# Patient Record
Sex: Male | Born: 1965 | Race: White | Hispanic: No | Marital: Married | Smoking: Never smoker
Health system: Southern US, Community
[De-identification: ages and names within clinical notes are randomized; demographics above are authoritative.]

## PROBLEM LIST (undated history)

## (undated) DIAGNOSIS — N529 Male erectile dysfunction, unspecified: Secondary | ICD-10-CM

## (undated) DIAGNOSIS — C9110 Chronic lymphocytic leukemia of B-cell type not having achieved remission: Secondary | ICD-10-CM

## (undated) DIAGNOSIS — M1990 Unspecified osteoarthritis, unspecified site: Secondary | ICD-10-CM

## (undated) DIAGNOSIS — S42009A Fracture of unspecified part of unspecified clavicle, initial encounter for closed fracture: Secondary | ICD-10-CM

## (undated) DIAGNOSIS — T8859XA Other complications of anesthesia, initial encounter: Secondary | ICD-10-CM

## (undated) DIAGNOSIS — K219 Gastro-esophageal reflux disease without esophagitis: Secondary | ICD-10-CM

## (undated) DIAGNOSIS — Z87442 Personal history of urinary calculi: Secondary | ICD-10-CM

## (undated) DIAGNOSIS — N289 Disorder of kidney and ureter, unspecified: Secondary | ICD-10-CM

## (undated) DIAGNOSIS — N209 Urinary calculus, unspecified: Secondary | ICD-10-CM

## (undated) DIAGNOSIS — T4145XA Adverse effect of unspecified anesthetic, initial encounter: Secondary | ICD-10-CM

## (undated) HISTORY — PX: BICEPS TENDON REPAIR: SHX566

## (undated) HISTORY — DX: Fracture of unspecified part of unspecified clavicle, initial encounter for closed fracture: S42.009A

## (undated) HISTORY — DX: Male erectile dysfunction, unspecified: N52.9

## (undated) HISTORY — DX: Chronic lymphocytic leukemia of B-cell type not having achieved remission: C91.10

## (undated) HISTORY — PX: CHOLECYSTECTOMY: SHX55

## (undated) HISTORY — DX: Urinary calculus, unspecified: N20.9

## (undated) HISTORY — PX: ANTERIOR CRUCIATE LIGAMENT REPAIR: SHX115

## (undated) HISTORY — DX: Morbid (severe) obesity due to excess calories: E66.01

---

## 2000-09-20 ENCOUNTER — Emergency Department (HOSPITAL_COMMUNITY): Admission: EM | Admit: 2000-09-20 | Discharge: 2000-09-20 | Payer: Self-pay | Admitting: *Deleted

## 2006-08-02 ENCOUNTER — Ambulatory Visit: Payer: Self-pay

## 2006-09-05 ENCOUNTER — Ambulatory Visit: Payer: Self-pay | Admitting: Orthopaedic Surgery

## 2006-10-27 ENCOUNTER — Ambulatory Visit: Payer: Self-pay | Admitting: Orthopaedic Surgery

## 2006-11-17 ENCOUNTER — Ambulatory Visit: Payer: Self-pay | Admitting: Orthopaedic Surgery

## 2007-03-23 ENCOUNTER — Other Ambulatory Visit: Payer: Self-pay

## 2007-03-23 ENCOUNTER — Emergency Department: Payer: Self-pay | Admitting: Emergency Medicine

## 2007-03-24 ENCOUNTER — Emergency Department: Payer: Self-pay | Admitting: Emergency Medicine

## 2007-03-24 ENCOUNTER — Observation Stay: Payer: Self-pay | Admitting: Internal Medicine

## 2010-08-23 ENCOUNTER — Emergency Department: Payer: Self-pay | Admitting: Internal Medicine

## 2011-08-05 ENCOUNTER — Emergency Department: Payer: Self-pay

## 2011-08-05 ENCOUNTER — Emergency Department: Payer: Self-pay | Admitting: Emergency Medicine

## 2011-08-06 ENCOUNTER — Emergency Department: Payer: Self-pay | Admitting: Internal Medicine

## 2012-04-30 ENCOUNTER — Emergency Department: Payer: Self-pay | Admitting: Unknown Physician Specialty

## 2012-04-30 LAB — BASIC METABOLIC PANEL
Anion Gap: 10 (ref 7–16)
Creatinine: 1.32 mg/dL — ABNORMAL HIGH (ref 0.60–1.30)
EGFR (African American): >60
EGFR (Non-African Amer.): >60
Glucose: 105 mg/dL — ABNORMAL HIGH (ref 65–99)
Osmolality: 278 (ref 275–301)
Potassium: 3.7 mmol/L (ref 3.5–5.1)
Sodium: 138 mmol/L (ref 136–145)

## 2012-04-30 LAB — CBC WITH DIFFERENTIAL/PLATELET
Eosinophil %: 0.2 %
HCT: 47.7 % (ref 40.0–52.0)
HGB: 15.4 g/dL (ref 13.0–18.0)
Lymphocyte #: 2.2 10*3/uL (ref 1.0–3.6)
Lymphocyte %: 10.7 %
MCH: 26.7 pg (ref 26.0–34.0)
MCHC: 32.2 g/dL (ref 32.0–36.0)
MCV: 83 fL (ref 80–100)
Monocyte #: 0.7 x10 3/mm (ref 0.2–1.0)
Monocyte %: 3.2 %
Platelet: 201 10*3/uL (ref 150–440)

## 2012-05-01 LAB — URINALYSIS, COMPLETE
Bacteria: NONE SEEN
Bilirubin,UR: NEGATIVE
Glucose,UR: NEGATIVE mg/dL (ref 0–75)
Ketone: NEGATIVE
Nitrite: NEGATIVE
Ph: 5 (ref 4.5–8.0)
Protein: NEGATIVE
Specific Gravity: 1.016 (ref 1.003–1.030)
Squamous Epithelial: <1
WBC UR: 1 /HPF (ref 0–5)

## 2012-05-01 LAB — HEPATIC FUNCTION PANEL A (ARMC)
Alkaline Phosphatase: 101 U/L (ref 50–136)
SGPT (ALT): 76 U/L
Total Protein: 7.2 g/dL (ref 6.4–8.2)

## 2012-05-01 LAB — LIPASE, BLOOD: Lipase: 117 U/L (ref 73–393)

## 2012-05-06 LAB — CULTURE, BLOOD (SINGLE)

## 2012-05-08 LAB — CULTURE, BLOOD (SINGLE)

## 2013-06-04 ENCOUNTER — Inpatient Hospital Stay: Payer: Self-pay | Admitting: Surgery

## 2013-06-04 LAB — URINALYSIS, COMPLETE
Glucose,UR: NEGATIVE mg/dL (ref 0–75)
Leukocyte Esterase: NEGATIVE
Ph: 5 (ref 4.5–8.0)
Protein: NEGATIVE
RBC,UR: 2 /HPF (ref 0–5)
Squamous Epithelial: <1

## 2013-06-04 LAB — COMPREHENSIVE METABOLIC PANEL
Albumin: 3.6 g/dL (ref 3.4–5.0)
Alkaline Phosphatase: 125 U/L (ref 50–136)
Bilirubin,Total: 0.3 mg/dL (ref 0.2–1.0)
Calcium, Total: 9 mg/dL (ref 8.5–10.1)
Chloride: 109 mmol/L — ABNORMAL HIGH (ref 98–107)
Co2: 26 mmol/L (ref 21–32)
EGFR (African American): >60
Osmolality: 288 (ref 275–301)
Potassium: 4.1 mmol/L (ref 3.5–5.1)
SGOT(AST): 24 U/L (ref 15–37)
SGPT (ALT): 39 U/L (ref 12–78)
Sodium: 143 mmol/L (ref 136–145)
Total Protein: 7.7 g/dL (ref 6.4–8.2)

## 2013-06-04 LAB — DRUG SCREEN, URINE
Amphetamines, Ur Screen: NEGATIVE (ref ?–1000)
Benzodiazepine, Ur Scrn: NEGATIVE (ref ?–200)
Cocaine Metabolite,Ur ~~LOC~~: NEGATIVE (ref ?–300)
Opiate, Ur Screen: POSITIVE (ref ?–300)
Phencyclidine (PCP) Ur S: NEGATIVE (ref ?–25)
Tricyclic, Ur Screen: NEGATIVE (ref ?–1000)

## 2013-06-04 LAB — CBC
MCH: 26.8 pg (ref 26.0–34.0)
Platelet: 247 10*3/uL (ref 150–440)
RBC: 5.54 10*6/uL (ref 4.40–5.90)
RDW: 15.1 % — ABNORMAL HIGH (ref 11.5–14.5)
WBC: 15.8 10*3/uL — ABNORMAL HIGH (ref 3.8–10.6)

## 2013-06-04 LAB — LIPASE, BLOOD: Lipase: 136 U/L (ref 73–393)

## 2013-06-06 LAB — PATHOLOGY REPORT

## 2013-10-25 ENCOUNTER — Emergency Department (HOSPITAL_COMMUNITY)
Admission: EM | Admit: 2013-10-25 | Discharge: 2013-10-26 | Disposition: A | Discharge status: Home / Self Care | Payer: Self-pay | Attending: Emergency Medicine | Admitting: Emergency Medicine

## 2013-10-25 ENCOUNTER — Encounter (HOSPITAL_COMMUNITY): Payer: Self-pay | Admitting: Emergency Medicine

## 2013-10-25 ENCOUNTER — Emergency Department (HOSPITAL_COMMUNITY): Payer: Self-pay

## 2013-10-25 DIAGNOSIS — Y9389 Activity, other specified: Secondary | ICD-10-CM | POA: Insufficient documentation

## 2013-10-25 DIAGNOSIS — F17200 Nicotine dependence, unspecified, uncomplicated: Secondary | ICD-10-CM | POA: Insufficient documentation

## 2013-10-25 DIAGNOSIS — M546 Pain in thoracic spine: Secondary | ICD-10-CM

## 2013-10-25 DIAGNOSIS — Y92410 Unspecified street and highway as the place of occurrence of the external cause: Secondary | ICD-10-CM | POA: Insufficient documentation

## 2013-10-25 DIAGNOSIS — S42002A Fracture of unspecified part of left clavicle, initial encounter for closed fracture: Secondary | ICD-10-CM

## 2013-10-25 DIAGNOSIS — IMO0002 Reserved for concepts with insufficient information to code with codable children: Secondary | ICD-10-CM | POA: Insufficient documentation

## 2013-10-25 DIAGNOSIS — S42009A Fracture of unspecified part of unspecified clavicle, initial encounter for closed fracture: Secondary | ICD-10-CM | POA: Insufficient documentation

## 2013-10-25 LAB — CBC WITH DIFFERENTIAL/PLATELET
Basophils Relative: 0 % (ref 0–1)
Eosinophils Absolute: 0.1 10*3/uL (ref 0.0–0.7)
Eosinophils Relative: 0 % (ref 0–5)
HCT: 44.8 % (ref 39.0–52.0)
Hemoglobin: 14.8 g/dL (ref 13.0–17.0)
MCH: 28.1 pg (ref 26.0–34.0)
MCHC: 33 g/dL (ref 30.0–36.0)
MCV: 85 fL (ref 78.0–100.0)
Monocytes Absolute: 1 10*3/uL (ref 0.1–1.0)
Monocytes Relative: 6 % (ref 3–12)

## 2013-10-25 LAB — BASIC METABOLIC PANEL
BUN: 23 mg/dL (ref 6–23)
Chloride: 102 mEq/L (ref 96–112)
Creatinine, Ser: 0.93 mg/dL (ref 0.50–1.35)
GFR calc Af Amer: >90 mL/min (ref >90)
GFR calc non Af Amer: >90 mL/min (ref >90)
Sodium: 136 mEq/L (ref 135–145)

## 2013-10-25 LAB — LACTIC ACID, PLASMA: Lactic Acid, Venous: 1.3 mmol/L (ref 0.5–2.2)

## 2013-10-25 MED ORDER — HYDROMORPHONE HCL PF 1 MG/ML IJ SOLN
1.0000 mg | Freq: Once | INTRAMUSCULAR | Status: AC
  Administered 2013-10-25: 1 mg via INTRAVENOUS
  Filled 2013-10-25: qty 1

## 2013-10-25 MED ORDER — FENTANYL CITRATE 0.05 MG/ML IJ SOLN
50.0000 ug | Freq: Once | INTRAMUSCULAR | Status: AC
  Administered 2013-10-25: 50 ug via INTRAVENOUS
  Filled 2013-10-25: qty 2

## 2013-10-25 MED ORDER — DIAZEPAM 5 MG PO TABS
5.0000 mg | ORAL_TABLET | Freq: Once | ORAL | Status: AC
  Administered 2013-10-25: 5 mg via ORAL
  Filled 2013-10-25: qty 1

## 2013-10-25 MED ORDER — OXYCODONE-ACETAMINOPHEN 5-325 MG PO TABS
1.0000 | ORAL_TABLET | Freq: Once | ORAL | Status: AC
  Administered 2013-10-25: 1 via ORAL
  Filled 2013-10-25: qty 1

## 2013-10-25 MED ORDER — SODIUM CHLORIDE 0.9 % IV BOLUS (SEPSIS)
1000.0000 mL | Freq: Once | INTRAVENOUS | Status: AC
  Administered 2013-10-25: 1000 mL via INTRAVENOUS
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
## 2013-10-25 NOTE — ED Notes (Signed)
Med given 

## 2013-10-25 NOTE — ED Notes (Signed)
Pain "better"

## 2013-10-25 NOTE — ED Notes (Signed)
The pt has some pain relief.  Ortho tech called for a shoulder immobilizer

## 2013-10-25 NOTE — ED Notes (Signed)
c-t will call for the pt  In 2 minutes

## 2013-10-25 NOTE — ED Notes (Signed)
Back to c-t 

## 2013-10-25 NOTE — ED Notes (Signed)
The pt received fentanyl im 100 mcg enroute here

## 2013-10-25 NOTE — ED Notes (Signed)
The pt returned from c-t asking for more pain meds
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
## 2013-10-25 NOTE — ED Notes (Signed)
No iv yet.  Tech here to take to xray

## 2013-10-25 NOTE — ED Notes (Signed)
pts family at the bedside.

## 2013-10-25 NOTE — Progress Notes (Signed)
Orthopedic Tech Progress Note Patient Details:  Albert Sanders Mar 21, 1966 [id number]  Ortho Devices Type of Ortho Device: Sling immobilizer Ortho Device/Splint Location: lue Ortho Device/Splint Interventions: Application   [name] 10/25/2013, 11:01 PM
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
## 2013-10-25 NOTE — ED Notes (Signed)
The pt keeps c/o extreme pain ice  Packs placed and he is asking more iv pain med again.  None ordered

## 2013-10-26 MED ORDER — ONDANSETRON HCL 4 MG PO TABS: 4.0000 mg | ORAL_TABLET | Freq: Four times a day (QID) | ORAL | Status: DC

## 2013-10-26 MED ORDER — ORPHENADRINE CITRATE ER 100 MG PO TB12: 100.0000 mg | ORAL_TABLET | Freq: Two times a day (BID) | ORAL | Status: DC | PRN

## 2013-10-26 MED ORDER — OXYCODONE HCL 5 MG PO TABS: 5.0000 mg | ORAL_TABLET | ORAL | Status: DC | PRN

## 2013-10-26 NOTE — ED Notes (Signed)
Crackers and sprite given
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
## 2015-01-10 ENCOUNTER — Emergency Department: Payer: Self-pay | Admitting: Emergency Medicine

## 2015-03-03 ENCOUNTER — Emergency Department (HOSPITAL_COMMUNITY): Payer: Self-pay

## 2015-03-03 ENCOUNTER — Emergency Department (HOSPITAL_COMMUNITY)
Admission: EM | Admit: 2015-03-03 | Discharge: 2015-03-03 | Disposition: A | Discharge status: Home / Self Care | Payer: Self-pay | Attending: Emergency Medicine | Admitting: Emergency Medicine

## 2015-03-03 ENCOUNTER — Encounter (HOSPITAL_COMMUNITY): Payer: Self-pay

## 2015-03-03 DIAGNOSIS — Y998 Other external cause status: Secondary | ICD-10-CM | POA: Insufficient documentation

## 2015-03-03 DIAGNOSIS — Y9289 Other specified places as the place of occurrence of the external cause: Secondary | ICD-10-CM | POA: Insufficient documentation

## 2015-03-03 DIAGNOSIS — Z79899 Other long term (current) drug therapy: Secondary | ICD-10-CM | POA: Insufficient documentation

## 2015-03-03 DIAGNOSIS — W19XXXA Unspecified fall, initial encounter: Secondary | ICD-10-CM

## 2015-03-03 DIAGNOSIS — Y9389 Activity, other specified: Secondary | ICD-10-CM | POA: Insufficient documentation

## 2015-03-03 DIAGNOSIS — S3992XA Unspecified injury of lower back, initial encounter: Secondary | ICD-10-CM | POA: Insufficient documentation

## 2015-03-03 DIAGNOSIS — W1789XA Other fall from one level to another, initial encounter: Secondary | ICD-10-CM | POA: Insufficient documentation

## 2015-03-03 DIAGNOSIS — S8991XA Unspecified injury of right lower leg, initial encounter: Secondary | ICD-10-CM | POA: Insufficient documentation

## 2015-03-03 LAB — I-STAT CHEM 8, ED
BUN: 20 mg/dL (ref 6–23)
CALCIUM ION: 1.04 mmol/L — AB (ref 1.12–1.23)
Chloride: 103 mmol/L (ref 96–112)
Creatinine, Ser: 0.9 mg/dL (ref 0.50–1.35)
Glucose, Bld: 94 mg/dL (ref 70–99)
HCT: 50 % (ref 39.0–52.0)
Hemoglobin: 17 g/dL (ref 13.0–17.0)
Potassium: 4.2 mmol/L (ref 3.5–5.1)
Sodium: 139 mmol/L (ref 135–145)
TCO2: 21 mmol/L (ref 0–100)

## 2015-03-03 LAB — CBC WITH DIFFERENTIAL/PLATELET
BASOS ABS: 0 10*3/uL (ref 0.0–0.1)
Basophils Relative: 0 % (ref 0–1)
EOS ABS: 0.2 10*3/uL (ref 0.0–0.7)
Eosinophils Relative: 1 % (ref 0–5)
HCT: 46.4 % (ref 39.0–52.0)
Hemoglobin: 15.2 g/dL (ref 13.0–17.0)
LYMPHS PCT: 47 % — AB (ref 12–46)
Lymphs Abs: 8.4 10*3/uL — ABNORMAL HIGH (ref 0.7–4.0)
MCH: 27.6 pg (ref 26.0–34.0)
MCHC: 32.8 g/dL (ref 30.0–36.0)
MCV: 84.4 fL (ref 78.0–100.0)
MONOS PCT: 6 % (ref 3–12)
Monocytes Absolute: 1.1 10*3/uL — ABNORMAL HIGH (ref 0.1–1.0)
Neutro Abs: 8.3 10*3/uL — ABNORMAL HIGH (ref 1.7–7.7)
Neutrophils Relative %: 46 % (ref 43–77)
PLATELETS: 213 10*3/uL (ref 150–400)
RBC: 5.5 MIL/uL (ref 4.22–5.81)
RDW: 14.6 % (ref 11.5–15.5)
WBC: 18 10*3/uL — ABNORMAL HIGH (ref 4.0–10.5)

## 2015-03-03 LAB — ABO/RH: ABO/RH(D): B POS

## 2015-03-03 LAB — TYPE AND SCREEN
ABO/RH(D): B POS
Antibody Screen: NEGATIVE

## 2015-03-03 MED ORDER — MORPHINE SULFATE 4 MG/ML IJ SOLN
4.0000 mg | Freq: Once | INTRAMUSCULAR | Status: AC
  Administered 2015-03-03: 4 mg via INTRAVENOUS
  Filled 2015-03-03: qty 1

## 2015-03-03 MED ORDER — OXYCODONE-ACETAMINOPHEN 5-325 MG PO TABS: 1.0000 | ORAL_TABLET | ORAL | Status: DC | PRN

## 2015-03-03 MED ORDER — FENTANYL CITRATE 0.05 MG/ML IJ SOLN: 150.0000 ug | Freq: Once | INTRAMUSCULAR | Status: DC

## 2015-03-03 MED ORDER — MORPHINE SULFATE 4 MG/ML IJ SOLN
4.0000 mg | Freq: Once | INTRAMUSCULAR | Status: DC
  Filled 2015-03-03: qty 1

## 2015-03-03 MED ORDER — CYCLOBENZAPRINE HCL 10 MG PO TABS: 10.0000 mg | ORAL_TABLET | Freq: Two times a day (BID) | ORAL | Status: DC | PRN

## 2015-03-03 MED ORDER — OXYCODONE-ACETAMINOPHEN 5-325 MG PO TABS
2.0000 | ORAL_TABLET | Freq: Once | ORAL | Status: AC
  Administered 2015-03-03: 2 via ORAL
  Filled 2015-03-03: qty 2

## 2015-03-03 MED ORDER — ONDANSETRON HCL 4 MG/2ML IJ SOLN
4.0000 mg | Freq: Once | INTRAMUSCULAR | Status: AC
  Administered 2015-03-03: 4 mg via INTRAVENOUS
  Filled 2015-03-03: qty 2

## 2015-03-03 MED ORDER — MORPHINE SULFATE 4 MG/ML IJ SOLN
4.0000 mg | Freq: Once | INTRAMUSCULAR | Status: AC
  Administered 2015-03-03: 4 mg via INTRAVENOUS

## 2015-03-03 NOTE — ED Notes (Signed)
Pt. Left with all belongings 

## 2015-03-03 NOTE — ED Notes (Signed)
Pt fell approx 6-8 ft. Off a ladder while cleaning the gutters. Pt. States he attempted to land on his feet and felt a "pop" in his R leg when he landed. Pt. Complaint of lower back pain and R knee pain behind the knee cap. Pt. Given 100 mcg fentanyl. Pt. On LSB with head blocks and C collar. A x O x4.

## 2015-03-03 NOTE — ED Notes (Signed)
Pt. Walked about 69ft. C/o pain in back but was able to stand up straight. C/o pain in knee but was able to move and bear weight on it

## 2015-03-03 NOTE — Discharge Instructions (Signed)
Back Pain, Adult Low back pain is very common. About 1 in 5 people have back pain.The cause of low back pain is rarely dangerous. The pain often gets better over time.About half of people with a sudden onset of back pain feel better in just 2 weeks. About 8 in 10 people feel better by 6 weeks.  CAUSES Some common causes of back pain include:  Strain of the muscles or ligaments supporting the spine.  Wear and tear (degeneration) of the spinal discs.  Arthritis.  Direct injury to the back. DIAGNOSIS Most of the time, the direct cause of low back pain is not known.However, back pain can be treated effectively even when the exact cause of the pain is unknown.Answering your caregiver's questions about your overall health and symptoms is one of the most accurate ways to make sure the cause of your pain is not dangerous. If your caregiver needs more information, he or she may order lab work or imaging tests (X-rays or MRIs).However, even if imaging tests show changes in your back, this usually does not require surgery. HOME CARE INSTRUCTIONS For many people, back pain returns.Since low back pain is rarely dangerous, it is often a condition that people can learn to manageon their own.   Remain active. It is stressful on the back to sit or stand in one place. Do not sit, drive, or stand in one place for more than 30 minutes at a time. Take short walks on level surfaces as soon as pain allows.Try to increase the length of time you walk each day.  Do not stay in bed.Resting more than 1 or 2 days can delay your recovery.  Do not avoid exercise or work.Your body is made to move.It is not dangerous to be active, even though your back may hurt.Your back will likely heal faster if you return to being active before your pain is gone.  Pay attention to your body when you bend and lift. Many people have less discomfortwhen lifting if they bend their knees, keep the load close to their bodies,and  avoid twisting. Often, the most comfortable positions are those that put less stress on your recovering back.  Find a comfortable position to sleep. Use a firm mattress and lie on your side with your knees slightly bent. If you lie on your back, put a pillow under your knees.  Only take over-the-counter or prescription medicines as directed by your caregiver. Over-the-counter medicines to reduce pain and inflammation are often the most helpful.Your caregiver may prescribe muscle relaxant drugs.These medicines help dull your pain so you can more quickly return to your normal activities and healthy exercise.  Put ice on the injured area.  Put ice in a plastic bag.  Place a towel between your skin and the bag.  Leave the ice on for 15-20 minutes, 03-04 times a day for the first 2 to 3 days. After that, ice and heat may be alternated to reduce pain and spasms.  Ask your caregiver about trying back exercises and gentle massage. This may be of some benefit.  Avoid feeling anxious or stressed.Stress increases muscle tension and can worsen back pain.It is important to recognize when you are anxious or stressed and learn ways to manage it.Exercise is a great option. SEEK MEDICAL CARE IF:  You have pain that is not relieved with rest or medicine.  You have pain that does not improve in 1 week.  You have new symptoms.  You are generally not feeling well. SEEK   IMMEDIATE MEDICAL CARE IF:   You have pain that radiates from your back into your legs.  You develop new bowel or bladder control problems.  You have unusual weakness or numbness in your arms or legs.  You develop nausea or vomiting.  You develop abdominal pain.  You feel faint. Document Released: 11/14/2005 Document Revised: 05/15/2012 Document Reviewed: 03/18/2014 [employer] Patient Information 2015 [employer]. This information is not intended to replace advice given to you by your health care provider. Make sure you  discuss any questions you have with your health care provider.  

## 2015-03-03 NOTE — ED Provider Notes (Signed)
CSN: [id number]     Arrival date & time 03/03/15  1655 History   First MD Initiated Contact with Patient 03/03/15 1702     Chief Complaint  Patient presents with  . Fall     (Consider location/radiation/quality/duration/timing/severity/associated sxs/prior Treatment) HPI   Yancy Knoble is a 49 year old male with no significant past medical history comes in after a fall. Patient was on a ladder and was 6-8 feet in the air when he fell off and landed on his feet.  He had immediate pain in his right knee as well as his lower back. EMS was called they immobilized him on a backboard and c-collar and brought him to the hospital for further evaluation.   He denies any weakness in his extremities.  The pain in his back he describes as severe, in the lower back.  The pain in his right knee he describes as feeling like it is behind his right knee and says it feels similar to when he previously pulled his ACL.  He denies any chest/abdominal pain.  He denies any headache.  He had no LOC in the fall.  He denies neck pain.  History reviewed. No pertinent past medical history. Past Surgical History  Procedure Laterality Date  . Cholecystectomy    . Anterior cruciate ligament repair     No family history on file. History  Substance Use Topics  . Smoking status: Never Smoker   . Smokeless tobacco: Current User    Types: Snuff  . Alcohol Use: No    Review of Systems  Constitutional: Negative for fever and chills.  Eyes: Negative for redness.  Respiratory: Negative for cough and shortness of breath.   Cardiovascular: Negative for chest pain.  Gastrointestinal: Negative for nausea, vomiting, abdominal pain and diarrhea.  Genitourinary: Negative for dysuria.  Musculoskeletal: Positive for back pain (lower).       Right knee pain  Skin: Negative for rash.  Neurological: Negative for headaches.  All other systems reviewed and are negative.     Allergies  Review of patient's allergies  indicates no known allergies.  Home Medications   Prior to Admission medications   Medication Sig Start Date End Date Taking? Authorizing Provider  Multiple Vitamin (MULTIVITAMIN WITH MINERALS) TABS tablet Take 1 tablet by mouth daily.    Historical Provider, MD  ondansetron (ZOFRAN) 4 MG tablet Take 1 tablet (4 mg total) by mouth every 6 (six) hours. 10/26/13   Robynn Pane, MD  orphenadrine (NORFLEX) 100 MG tablet Take 1 tablet (100 mg total) by mouth 2 (two) times daily as needed for muscle spasms. 10/26/13   Robynn Pane, MD  oxyCODONE (ROXICODONE) 5 MG immediate release tablet Take 1-2 tablets (5-10 mg total) by mouth every 4 (four) hours as needed for severe pain. 10/26/13   Robynn Pane, MD   SpO2 98% Physical Exam  Constitutional: He is oriented to person, place, and time. No distress.  HENT:  Head: Normocephalic and atraumatic.  Eyes: EOM are normal. Pupils are equal, round, and reactive to light.  Neck: Normal range of motion. Neck supple.  Cardiovascular: Normal rate.   Pulmonary/Chest: Effort normal. No respiratory distress. He exhibits no tenderness.  Abdominal: Soft. There is no tenderness. There is no rebound and no guarding.  Musculoskeletal:  There is ttp around the right knee w/ no obvious deformity.  The right ankle appears swollen but the patient denies any ttp.  He has intact motor/sensory function and good cap refill in both feet.  He  has ttp over his lumbar spine w/o any stepoffs.  Neurological: He is alert and oriented to person, place, and time.  Skin: No rash noted. He is not diaphoretic.  Psychiatric: He has a normal mood and affect.    ED Course  Procedures (including critical care time) Labs Review Labs Reviewed  CBC WITH DIFFERENTIAL/PLATELET  URINALYSIS, ROUTINE W REFLEX MICROSCOPIC  I-STAT CHEM 8, ED  TYPE AND SCREEN    Imaging Review No results found.   EKG Interpretation None      MDM   Final diagnoses:  None    Kelton Bultman is a  49 year old male with no significant past medical history comes in after a fall. Patient was on a ladder approx 6-8 feet in the air when he fell off and landed on his feet and had immediate pain in his right knee as well as his lower back.   Exam as above.  No neuro/sensory deficits in the bilateral lower extremities. He has ttp over his lumbar spine and ttp of the right knee.  There is some swelling of the right ankle but no ttp.  Will obtain plain films of the right knee.  Will obtain CT head/c/t/l spine, abdominal ct, cxr/pxr, and plain films of the right knee and ankle.  All films show no acute injuries.  paitent able to ambulate in the ED.  VS continue to be stable.  Abdominal CT showed abdominal lymphadenopathy which the radiologst recommended f/u CT in 3 months for.  Patient does have a white count of 18 but in the setting of trauma this is likely stress response. He will need repeat labs and imaging in 3 months which he was informed of in the ED.  I have discussed the results, Dx and Tx plan with the patient. They expressed understanding and agree with the plan and were told to return to ED with any worsening of condition or concern.    Disposition: Discharge  Condition: Good  Discharge Medication List as of 03/03/2015  8:07 PM    START taking these medications   Details  cyclobenzaprine (FLEXERIL) 10 MG tablet Take 1 tablet (10 mg total) by mouth 2 (two) times daily as needed for muscle spasms., Starting 03/03/2015, Until Discontinued, Print    oxyCODONE-acetaminophen (PERCOCET/ROXICET) 5-325 MG per tablet Take 1 tablet by mouth every 4 (four) hours as needed for severe pain., Starting 03/03/2015, Until Discontinued, Print        Follow Up: No Pcp Per Patient     [facility] [address] [id number] [place] [phone] In 3 days    Pt seen in conjunction with Dr. Fabio Asa,  MD 03/04/15 [place], MD 03/04/15 [place], MD 03/04/15 [phone]

## 2015-03-20 NOTE — Op Note (Signed)
PATIENT NAME:  Albert Sanders, Albert Sanders MR#:  [id number] DATE OF BIRTH:  [birth year]  DATE OF PROCEDURE:  06/04/2013  PREOPERATIVE DIAGNOSIS:  Acute calculus cholecystitis.   POSTOPERATIVE DIAGNOSIS:  Acute calculus cholecystitis with early gangrenous changes.   PROCEDURE PERFORMED:  Laparoscopic cholecystectomy.   SURGEON:  Sherri Rad, M.D. FACS  ASSISTANT:  Scrub tech.  TYPE OF ANESTHESIA:  General endotracheal.   SPECIMENS:  Gallbladder with contents.   FINDINGS:  As described above.   ESTIMATED BLOOD LOSS:  25 mL.   DRAINS:  Jackson-Pratt in Morison pouch.   DESCRIPTION OF PROCEDURE:  Informed consent, supine position, general oral endotracheal anesthesia, sterile prep and drape of the abdomen with ChloraPrep.  Timeout was observed.  A 12 mm blunt Hassan trocar was placed via an open technique through an infraumbilical transverse-oriented skin incision.  Stay sutures being passed through the fascia.  Pneumoperitoneum was established.  The patient was then positioned in reverse Trendelenburg and airplane right side up.  A 5 mm Bladeless trocar was placed in the epigastric region.  Two 5-mm first assistant ports in the right subcostal margin.  The gallbladder was identified in the right upper quadrant, had early patchy gangrenous changes.  It was aspirated of 50 mL of thick bile.  Grasped along the fundus and elevated towards the right shoulder.  Lateral traction was placed on Hartmann's pouch.  Dissection of the hepatoduodenal ligament with blunt technique and point cautery demonstrated a cystic duct and single cystic artery with critical view of safety cholecystectomy being performed.  Three clips were placed on the cystic duct on the portal side, single clip on the gallbladder side.  The structure was then divided.  Small intervening lymphatic branch was divided between hemoclips.  The cystic artery was doubly clipped on the portal side, singly clipped on the gallbladder side and divided.  The  gallbladder was then retrieved off the gallbladder fossa utilizing hook electrocautery and blunt technique with spillage of a large amount of bile during the process.  This was immediately aspirated.  The specimen was placed into an Endo Catch device and retrieved.  The right upper quadrant gallbladder fossa was irrigated with 2 liters of warm normal saline during the case and aspirated dry.  A 19 mm Blake drain was directed into the Morison's pouch and exited the lowermost right upper quadrant port site.  Drain site was secured with nylon suture.  With ports removed under direct visualization and hemostasis ensured on the operative field, the infraumbilical fascial defect was reapproximated with several interrupted figure-of-eight and simple vertically oriented #0 Vicryl sutures.  Skin edges were reapproximated utilizing a skin stapler followed by application of Telfa and Tegaderm.  A total of 30 mL of 0.25% plain Marcaine was infiltrated along all skin and fascial incisions prior to closure.  The patient was then subsequently extubated and taken to the recovery room in stable and satisfactory condition by anesthesia services.     ____________________________ Jeannette How Marina Gravel, MD FACS mab:ea D: 06/04/2013 23:40:41 ET T: 06/05/2013 04:18:25 ET JOB#: [id number]  cc: Elta Guadeloupe A. Marina Gravel, MD, <Dictator> Mliss Wedin A Lesta Limbert MD ELECTRONICALLY SIGNED 06/06/2013 0:12

## 2015-03-20 NOTE — H&P (Signed)
Subjective/Chief Complaint severe RUQ pain   History of Present Illness 49 y/o obese white male presents with acute onset of RUQ pain and nausea.  Had similar but less intense type episode 1 week ago which resolved with some antacids.  W/U in ER suggestive of acute calculus cholecystitis seen on ultrasound.  Denies post-prandial abdominal pain.  Writhing in pain upon arrival, took 20 minutes for initial nursing evaluation.   Past History knee surgery   Past Med/Surgical Hx:  CHRONIC BACK PAIN:   right shoulder:   acl left knee:   ALLERGIES:  IVP Dye: Swelling   Other Allergies none.   HOME MEDICATIONS: Medication Instructions Status  PriLOSEC OTC 20 mg oral delayed release tablet 1 tab(s) orally once a day Active   Family and Social History:  Family History Non-Contributory   Social History negative tobacco, negative Illicit drugs   Place of Living Home   Review of Systems:  Subjective/Chief Complaint see above   Fever/Chills No   Cough No   Sputum No   Abdominal Pain Yes   Nausea/Vomiting Yes   Medications/Allergies Reviewed Medications/Allergies reviewed   Physical Exam:  GEN obese, disheveled, critically ill appearing   HEENT pale conjunctivae, PERRL, hearing intact to voice   NECK supple   RESP normal resp effort  clear BS   ABD positive tenderness  soft  positive Murphy's sign   LYMPH negative neck, negative axillae   EXTR negative cyanosis/clubbing, negative edema   SKIN normal to palpation, No rashes   NEURO cranial nerves intact   PSYCH A+O to time, place, person   Lab Results: Hepatic:  08-Jul-14 04:31   Bilirubin, Total 0.3  Alkaline Phosphatase 125  SGPT (ALT) 39  SGOT (AST) 24  Total Protein, Serum 7.7  Albumin, Serum 3.6  Routine Chem:  08-Jul-14 04:31   Glucose, Serum  113  BUN  19  Creatinine (comp) 1.15  Sodium, Serum 143  Potassium, Serum 4.1  Chloride, Serum  109  CO2, Serum 26  Calcium (Total), Serum 9.0   Osmolality (calc) 288  eGFR (Non-African American) >60 (eGFR values <56m/min/1.73 m2 may be an indication of chronic kidney disease (CKD). Calculated eGFR is useful in patients with stable renal function. The eGFR calculation will not be reliable in acutely ill patients when serum creatinine is changing rapidly. It is not useful in  patients on dialysis. The eGFR calculation may not be applicable to patients at the low and high extremes of body sizes, pregnant women, and vegetarians.)  Anion Gap 8  Lipase 136 (Result(s) reported on 04 Jun 2013 at 04:55AM.)  Routine UA:  08-Jul-14 05:54   Color (UA) Yellow  Clarity (UA) Hazy  Glucose (UA) Negative  Bilirubin (UA) Negative  Ketones (UA) Negative  Specific Gravity (UA) 1.025  Blood (UA) 1+  pH (UA) 5.0  Protein (UA) Negative  Nitrite (UA) Negative  Leukocyte Esterase (UA) Negative (Result(s) reported on 04 Jun 2013 at 06:11AM.)  RBC (UA) 2 /HPF  WBC (UA) 1 /HPF  Bacteria (UA) TRACE  Epithelial Cells (UA) <1 /HPF  Mucous (UA) PRESENT (Result(s) reported on 04 Jun 2013 at 06:11AM.)  Routine Hem:  08-Jul-14 04:31   WBC (CBC)  15.8  RBC (CBC) 5.54  Hemoglobin (CBC) 14.9  Hematocrit (CBC) 46.0  Platelet Count (CBC) 247 (Result(s) reported on 04 Jun 2013 at 04:50AM.)  MCV 83  MCH 26.8  MCHC 32.3  RDW  15.1    Assessment/Admission Diagnosis 49y/o male with acute cholecystitis,  awaiting official report on [place].   Plan admit, better pain control,  I was unable to discuss with him surgical intervention secondary to severe abdominal pain  start dilaudid PCA. start unasyn. will discuss with Day surgeon Dr. Leanora Cover timing of surgical treatment and re-evaluation. await drug screen ordered in ER. total time spent 45 minutes.   Electronic Signatures: Sherri Rad (MD)  (Signed 08-Jul-14 06:49)  Authored: CHIEF COMPLAINT and HISTORY, PAST MEDICAL/SURGIAL HISTORY, ALLERGIES, Other Allergies, HOME MEDICATIONS, FAMILY AND SOCIAL  HISTORY, REVIEW OF SYSTEMS, PHYSICAL EXAM, LABS, ASSESSMENT AND PLAN   Last Updated: 08-Jul-14 06:49 by Sherri Rad (MD)

## 2015-03-20 NOTE — Discharge Summary (Signed)
PATIENT NAME:  Albert Sanders, Albert Sanders MR#:  [id number] DATE OF BIRTH:  [birth year]  DATE OF ADMISSION:  06/04/2013 DATE OF DISCHARGE:  06/07/2013  FINAL DIAGNOSIS: Acute cholecystitis with early gangrenous changes.   PRINCIPLE PROCEDURE: Laparoscopic cholecystectomy on 07/08.   HOSPITAL COURSE SUMMARY: The patient had an uneventful postoperative stay. He did require Dilaudid PCA postoperatively. In addition, a trial of Toradol was given. JP was kept in place and remained without bile. On postoperative day #2, the Dilaudid was able to be weaned and the JP was removed. He was transitioned over to a regular diet and oral pain medications. On postoperative day #3, the patient was doing very well, tolerating a regular diet and oral pain medication and was discharged home in stable condition with followup in my office on Thursday.   DISCHARGE MEDICATIONS:  1.  Prilosec 20 mg extended release tablet once a day. 2.  Acetaminophen/oxycodone 5/325, 1 tablet every 6 hours as needed for pain.  Call with any questions, concerns or fever.  ____________________________ Jeannette How Marina Gravel, MD mab:aw D: 06/16/2013 20:22:24 ET T: 06/17/2013 08:02:04 ET JOB#: [id number]  cc: Elta [place] A. Marina Gravel, MD, <Dictator> Hortencia Conradi MD ELECTRONICALLY SIGNED 06/21/2013 15:30

## 2015-07-25 ENCOUNTER — Emergency Department: Payer: Self-pay

## 2015-07-25 ENCOUNTER — Encounter: Payer: Self-pay | Admitting: *Deleted

## 2015-07-25 ENCOUNTER — Emergency Department
Admission: EM | Admit: 2015-07-25 | Discharge: 2015-07-25 | Disposition: A | Discharge status: Home / Self Care | Payer: Self-pay | Attending: Emergency Medicine | Admitting: Emergency Medicine

## 2015-07-25 DIAGNOSIS — R52 Pain, unspecified: Secondary | ICD-10-CM

## 2015-07-25 DIAGNOSIS — I889 Nonspecific lymphadenitis, unspecified: Secondary | ICD-10-CM | POA: Insufficient documentation

## 2015-07-25 DIAGNOSIS — Z79899 Other long term (current) drug therapy: Secondary | ICD-10-CM | POA: Insufficient documentation

## 2015-07-25 DIAGNOSIS — R252 Cramp and spasm: Secondary | ICD-10-CM | POA: Insufficient documentation

## 2015-07-25 LAB — COMPREHENSIVE METABOLIC PANEL
ALBUMIN: 4.1 g/dL (ref 3.5–5.0)
ALT: 25 U/L (ref 17–63)
ANION GAP: 9 (ref 5–15)
AST: 25 U/L (ref 15–41)
Alkaline Phosphatase: 86 U/L (ref 38–126)
BUN: 20 mg/dL (ref 6–20)
CALCIUM: 9.1 mg/dL (ref 8.9–10.3)
CHLORIDE: 102 mmol/L (ref 101–111)
CO2: 28 mmol/L (ref 22–32)
Creatinine, Ser: 1.29 mg/dL — ABNORMAL HIGH (ref 0.61–1.24)
GFR calc Af Amer: >60 mL/min (ref >60)
GFR calc non Af Amer: >60 mL/min (ref >60)
GLUCOSE: 114 mg/dL — AB (ref 65–99)
Potassium: 4 mmol/L (ref 3.5–5.1)
Sodium: 139 mmol/L (ref 135–145)
Total Bilirubin: 1 mg/dL (ref 0.3–1.2)
Total Protein: 7.4 g/dL (ref 6.5–8.1)

## 2015-07-25 LAB — CBC WITH DIFFERENTIAL/PLATELET
BASOS PCT: 0 %
Basophils Absolute: 0 10*3/uL (ref 0–0.1)
Eosinophils Absolute: 0.1 10*3/uL (ref 0–0.7)
Eosinophils Relative: 1 %
LYMPHS ABS: 6.7 10*3/uL — AB (ref 1.0–3.6)
Lymphocytes Relative: 34 %
MONOS PCT: 3 %
Monocytes Absolute: 0.5 10*3/uL (ref 0.2–1.0)
NEUTROS ABS: 12.2 10*3/uL — AB (ref 1.4–6.5)
NEUTROS PCT: 62 %

## 2015-07-25 LAB — MAGNESIUM: MAGNESIUM: 1.6 mg/dL — AB (ref 1.7–2.4)

## 2015-07-25 LAB — URINALYSIS COMPLETE WITH MICROSCOPIC (ARMC ONLY)
BACTERIA UA: NONE SEEN
BILIRUBIN URINE: NEGATIVE
Glucose, UA: NEGATIVE mg/dL
HGB URINE DIPSTICK: NEGATIVE
KETONES UR: NEGATIVE mg/dL
LEUKOCYTES UA: NEGATIVE
NITRITE: NEGATIVE
PH: 5 (ref 5.0–8.0)
Protein, ur: NEGATIVE mg/dL
SPECIFIC GRAVITY, URINE: 1.03 (ref 1.005–1.030)

## 2015-07-25 LAB — CBC
HCT: 44.4 % (ref 40.0–52.0)
Hemoglobin: 14 g/dL (ref 13.0–18.0)
MCH: 26.2 pg (ref 26.0–34.0)
MCHC: 31.6 g/dL — ABNORMAL LOW (ref 32.0–36.0)
MCV: 82.8 fL (ref 80.0–100.0)
Platelets: 213 10*3/uL (ref 150–440)
RBC: 5.36 MIL/uL (ref 4.40–5.90)
RDW: 14.7 % — ABNORMAL HIGH (ref 11.5–14.5)
WBC: 19.4 10*3/uL — ABNORMAL HIGH (ref 3.8–10.6)

## 2015-07-25 LAB — TROPONIN I: Troponin I: <0.03 ng/mL (ref <0.031)

## 2015-07-25 MED ORDER — HYDROMORPHONE HCL 1 MG/ML IJ SOLN
0.5000 mg | Freq: Once | INTRAMUSCULAR | Status: AC
  Administered 2015-07-25: 0.5 mg via INTRAVENOUS
  Filled 2015-07-25: qty 1

## 2015-07-25 MED ORDER — FENTANYL CITRATE (PF) 100 MCG/2ML IJ SOLN
50.0000 ug | Freq: Once | INTRAMUSCULAR | Status: AC | PRN
  Administered 2015-07-25: 50 ug via INTRAVENOUS

## 2015-07-25 MED ORDER — HYDROMORPHONE HCL 1 MG/ML IJ SOLN
0.5000 mg | Freq: Once | INTRAMUSCULAR | Status: AC
  Administered 2015-07-25: 0.5 mg via INTRAVENOUS

## 2015-07-25 MED ORDER — ONDANSETRON HCL 4 MG/2ML IJ SOLN
4.0000 mg | Freq: Once | INTRAMUSCULAR | Status: AC
  Administered 2015-07-25: 4 mg via INTRAVENOUS
  Filled 2015-07-25: qty 2

## 2015-07-25 MED ORDER — HYDROMORPHONE HCL 1 MG/ML IJ SOLN
INTRAMUSCULAR | Status: AC
  Filled 2015-07-25: qty 1

## 2015-07-25 MED ORDER — HYDROMORPHONE HCL 1 MG/ML IJ SOLN
1.0000 mg | Freq: Once | INTRAMUSCULAR | Status: AC
  Administered 2015-07-25: 1 mg via INTRAVENOUS

## 2015-07-25 MED ORDER — MAGNESIUM SULFATE 2 GM/50ML IV SOLN
2.0000 g | Freq: Once | INTRAVENOUS | Status: AC
  Administered 2015-07-25: 2 g via INTRAVENOUS
  Filled 2015-07-25: qty 50

## 2015-07-25 MED ORDER — ONDANSETRON HCL 4 MG/2ML IJ SOLN
4.0000 mg | Freq: Once | INTRAMUSCULAR | Status: AC
  Administered 2015-07-25: 4 mg via INTRAVENOUS

## 2015-07-25 MED ORDER — HYDROMORPHONE HCL 1 MG/ML IJ SOLN
INTRAMUSCULAR | Status: AC
  Administered 2015-07-25: 1 mg via INTRAVENOUS
  Filled 2015-07-25: qty 1

## 2015-07-25 MED ORDER — ONDANSETRON HCL 4 MG/2ML IJ SOLN
INTRAMUSCULAR | Status: AC
  Administered 2015-07-25: 4 mg via INTRAVENOUS
  Filled 2015-07-25: qty 2

## 2015-07-25 MED ORDER — FENTANYL CITRATE (PF) 100 MCG/2ML IJ SOLN
1.0000 ug/kg | Freq: Once | INTRAMUSCULAR | Status: DC | PRN
  Filled 2015-07-25: qty 2

## 2015-07-25 NOTE — ED Notes (Signed)
Pt. States at around 1 am this morning pt. Stated getting cramps in lower extremities and lower back.  Pt. States having same in past, but would relieve itself on its own.

## 2015-07-25 NOTE — ED Provider Notes (Signed)
[facility] Department Provider Note       ____________________________________________  Time seen: Approximately 4:00 AM  I have reviewed the triage vital signs and the nursing notes.   HISTORY  Chief Complaint Back Pain    HPI Albert Sanders is a 49 y.o. male patient reports she was laying in bed and started to get pain in his low back and legs. This appeared to be crampy in nature may be muscle spasms ran all the way up his back. Patient reports that the pain got worse and worse to look became very severe worse than his previous episode with gall bladder disease and reminded him a lot of when he had kidney stones. Patient had no numbness or weakness. Patient had no dysuria. Patient no fever vomiting or diarrhea. Pain improved markedly with pain medicine he received earlier. Patient reports he's had one or 2 episodes of shortness of breath while lying in bed. Felt more like a choking episode. Patient reports he was seen here previously and told he had something wrong with his heart attack a blockage. He followed up with his doctor but was not happy with the results of that. He does not want me to want to tell me about it. Patient says he works outside a lot in the heat. He has not had any chest pain or shortness of breath while doing so.   Past Medical History  Diagnosis Date  . Kidney stone     There are no active problems to display for this patient.   Past Surgical History  Procedure Laterality Date  . Cholecystectomy    . Anterior cruciate ligament repair      Current Outpatient Rx  Name  Route  Sig  Dispense  Refill  . acetaminophen (TYLENOL) 500 MG tablet   Oral   Take 1,000 mg by mouth every 6 (six) hours as needed for moderate pain.         . cyclobenzaprine (FLEXERIL) 10 MG tablet   Oral   Take 1 tablet (10 mg total) by mouth 2 (two) times daily as needed for muscle spasms.   20 tablet   0   . omeprazole (PRILOSEC OTC) 20 MG  tablet   Oral   Take 20 mg by mouth as needed (for heartburn).         . ondansetron (ZOFRAN) 4 MG tablet   Oral   Take 1 tablet (4 mg total) by mouth every 6 (six) hours.   20 tablet   0   . orphenadrine (NORFLEX) 100 MG tablet   Oral   Take 1 tablet (100 mg total) by mouth 2 (two) times daily as needed for muscle spasms.   10 tablet   0   . oxyCODONE (ROXICODONE) 5 MG immediate release tablet   Oral   Take 1-2 tablets (5-10 mg total) by mouth every 4 (four) hours as needed for severe pain.   40 tablet   0   . oxyCODONE-acetaminophen (PERCOCET/ROXICET) 5-325 MG per tablet   Oral   Take 1 tablet by mouth every 4 (four) hours as needed for severe pain.   15 tablet   0     Allergies Contrast media  History reviewed. No pertinent family history.  Social History Social History  Substance Use Topics  . Smoking status: Never Smoker   . Smokeless tobacco: Current User    Types: Snuff  . Alcohol Use: Yes     Comment: occasionally  Review of Systems Constitutional: No fever/chills Eyes: No visual changes. ENT: No sore throat. Cardiovascular: Denies chest pain. Respiratory: Denies shortness of breath at present.. Gastrointestinal: No abdominal pain.  No nausea, no vomiting.  No diarrhea.  No constipation. Genitourinary: Negative for dysuria. Musculoskeletal: See history of present illness Skin: Negative for rash. Neurological: Negative for headaches, focal weakness or numbness.  10-point ROS otherwise negative.  ____________________________________________   PHYSICAL EXAM:  VITAL SIGNS: ED Triage Vitals  Enc Vitals Group     BP 07/25/15 0215 124/58 mmHg     Pulse Rate 07/25/15 0215 116     Resp 07/25/15 0215 22     Temp 07/25/15 0215 100.2 F (37.9 C)     Temp Source 07/25/15 0215 Oral     SpO2 07/25/15 0213 98 %     Weight 07/25/15 0215 210 lb (95.255 kg)     Height 07/25/15 0215 6\' 1"  (1.854 m)     Head Cir --      Peak Flow --      Pain Score  07/25/15 0216 10     Pain Loc --      Pain Edu? --      Excl. in [place]? --     Constitutional: Alert and oriented. Well appearing and in no acute distress. Eyes: Conjunctivae are normal. PERRL. EOMI. Head: Atraumatic. Nose: No congestion/rhinnorhea. Mouth/Throat: Mucous membranes are moist.  Oropharynx non-erythematous. Neck: No stridor.  Cardiovascular: Normal rate, regular rhythm. Grossly normal heart sounds.  Good peripheral circulation. Respiratory: Normal respiratory effort.  No retractions. Lungs CTAB. Gastrointestinal: Soft and nontender. No distention. No abdominal bruits. No CVA tenderness. Musculoskeletal: No lower extremity tenderness nor edema.  No joint effusions. No back pain to palpation or percussion Neurologic:  Normal speech and language. No gross focal neurologic deficits are appreciated. No gait instability. Skin:  Skin is warm, dry and intact. No rash noted. Psychiatric: Mood and affect are normal. Speech and behavior are normal.  ____________________________________________   LABS (all labs ordered are listed, but only abnormal results are displayed)  Labs Reviewed  CBC - Abnormal; Notable for the following:    WBC 19.4 (*)    MCHC 31.6 (*)    RDW 14.7 (*)    All other components within normal limits  URINALYSIS COMPLETEWITH MICROSCOPIC ([facility] ONLY) - Abnormal; Notable for the following:    Color, Urine AMBER (*)    APPearance CLEAR (*)    Squamous Epithelial / LPF 0-5 (*)    All other components within normal limits  COMPREHENSIVE METABOLIC PANEL - Abnormal; Notable for the following:    Glucose, Bld 114 (*)    Creatinine, Ser 1.29 (*)    All other components within normal limits  CBC WITH DIFFERENTIAL/PLATELET - Abnormal; Notable for the following:    Neutro Abs 12.2 (*)    Lymphs Abs 6.7 (*)    All other components within normal limits  MAGNESIUM - Abnormal; Notable for the following:    Magnesium 1.6 (*)    All other components within normal limits   URINE CULTURE  TROPONIN I   ____________________________________________  EKG   ____________________________________________  RADIOLOGY  CT scan shows no change in his splenomegaly. Patient also has multiple nodes in the upper abdomen. ____________________________________________   PROCEDURES    ____________________________________________   INITIAL IMPRESSION / ASSESSMENT AND PLAN / ED COURSE  Pertinent labs & imaging results that were available during my care of the patient were reviewed by me and considered in  my medical decision making (see chart for detailsPLEASE SEE THE BELOW H&P FOR FURTHER DETAILS  ____________________________________________   FINAL CLINICAL IMPRESSION(S) / ED DIAGNOSES  Final diagnoses:  Cramp of both lower extremities  Lymphadenitis     Time seen: Approximately 4:34 AM  I have reviewed the triage vital signs and the nursing notes.   HISTORY  Chief Complaint Back Pain   HPI Albert Sanders is a 49 y.o. male patient reports he was lying in bed and started to get cramps in the backs of his legs went up into the low back and then spread from the upper legs all throughout the entire back. Cramps were severe in nature. This is apparently an duplicate entry in the history and physical from the one above this please see that for the rest of the details   Past Medical History  Diagnosis Date  . Kidney stone     There are no active problems to display for this patient.   Past Surgical History  Procedure Laterality Date  . Cholecystectomy    . Anterior cruciate ligament repair      Current Outpatient Rx  Name  Route  Sig  Dispense  Refill  . acetaminophen (TYLENOL) 500 MG tablet   Oral   Take 1,000 mg by mouth every 6 (six) hours as needed for moderate pain.         . cyclobenzaprine (FLEXERIL) 10 MG tablet   Oral   Take 1 tablet (10 mg total) by mouth 2 (two) times daily as needed for muscle spasms.   20 tablet   0    . omeprazole (PRILOSEC OTC) 20 MG tablet   Oral   Take 20 mg by mouth as needed (for heartburn).         . ondansetron (ZOFRAN) 4 MG tablet   Oral   Take 1 tablet (4 mg total) by mouth every 6 (six) hours.   20 tablet   0   . orphenadrine (NORFLEX) 100 MG tablet   Oral   Take 1 tablet (100 mg total) by mouth 2 (two) times daily as needed for muscle spasms.   10 tablet   0   . oxyCODONE (ROXICODONE) 5 MG immediate release tablet   Oral   Take 1-2 tablets (5-10 mg total) by mouth every 4 (four) hours as needed for severe pain.   40 tablet   0   . oxyCODONE-acetaminophen (PERCOCET/ROXICET) 5-325 MG per tablet   Oral   Take 1 tablet by mouth every 4 (four) hours as needed for severe pain.   15 tablet   0     Allergies Contrast media  History reviewed. No pertinent family history.  Social History Social History  Substance Use Topics  . Smoking status: Never Smoker   . Smokeless tobacco: Current User    Types: Snuff  . Alcohol Use: Yes     Comment: occasionally    Review of Systems Constitutional: No fever/chills that the patient has noticed  Eyes: No visual changes. ENT: No sore throat. Cardiovascular: Denies chest pain. Respiratory: Denies shortness of breath. Gastrointestinal: No abdominal pain.  No nausea, no vomiting.  No diarrhea.  No constipation. Genitourinary: Negative for dysuria. Musculoskeletal: Negative for back pain. Skin: Negative for rash. Neurological: Negative for headaches, focal weakness or numbness.  10-point ROS otherwise negative.  ____________________________________________   PHYSICAL EXAM:  VITAL SIGNS: ED Triage Vitals  Enc Vitals Group     BP 07/25/15 0215 124/58 mmHg  Pulse Rate 07/25/15 0215 116     Resp 07/25/15 0215 22     Temp 07/25/15 0215 100.2 F (37.9 C)     Temp Source 07/25/15 0215 Oral     SpO2 07/25/15 0213 98 %     Weight 07/25/15 0215 210 lb (95.255 kg)     Height 07/25/15 0215 6\' 1"  (1.854 m)      Head Cir --      Peak Flow --      Pain Score 07/25/15 0216 10     Pain Loc --      Pain Edu? --      Excl. in [place]? --     Constitutional: Alert and oriented. Well appearing and in no acute distress. Eyes: Conjunctivae are normal. PERRL. EOMI. Head: Atraumatic. Nose: No congestion/rhinnorhea. Mouth/Throat: Mucous membranes are moist.  Oropharynx non-erythematous. Neck: No stridor.  Cardiovascular: Normal rate, regular rhythm. Grossly normal heart sounds.  Good peripheral circulation. Respiratory: Normal respiratory effort.  No retractions. Lungs CTAB. Gastrointestinal: Soft and nontender. No distention. No abdominal bruits. No CVA tenderness. Musculoskeletal: No lower extremity tenderness nor edema.  No joint effusions. There is no CVA tenderness Neurologic:  Normal speech and language. No gross focal neurologic deficits are appreciated. Straight leg raise is negative Skin:  Skin is warm, dry and intact. No rash noted. Psychiatric: Mood and affect are normal. Speech and behavior are normal.  ____________________________________________   LABS (all labs ordered are listed, but only abnormal results are displayed)  Labs Reviewed  CBC - Abnormal; Notable for the following:    WBC 19.4 (*)    MCHC 31.6 (*)    RDW 14.7 (*)    All other components within normal limits  URINALYSIS COMPLETEWITH MICROSCOPIC ([facility] ONLY) - Abnormal; Notable for the following:    Color, Urine AMBER (*)    APPearance CLEAR (*)    Squamous Epithelial / LPF 0-5 (*)    All other components within normal limits  COMPREHENSIVE METABOLIC PANEL - Abnormal; Notable for the following:    Glucose, Bld 114 (*)    Creatinine, Ser 1.29 (*)    All other components within normal limits  CBC WITH DIFFERENTIAL/PLATELET - Abnormal; Notable for the following:    Neutro Abs 12.2 (*)    Lymphs Abs 6.7 (*)    All other components within normal limits  MAGNESIUM - Abnormal; Notable for the following:    Magnesium 1.6 (*)     All other components within normal limits  URINE CULTURE  TROPONIN I   ____________________________________________  EKG  __________________________________________  RADIOLOGY  Chest x-ray shows no acute disease ____________________________________________   PROCEDURES    ____________________________________________   INITIAL IMPRESSION / ASSESSMENT AND PLAN / ED COURSE  Pertinent labs & imaging results that were available during my care of the patient were reviewed by me and considered in my medical decision making (see char Patient is to return if worset for details).  Chest x-ray is negative CT shows multiple nodes in the upper abdomen enlarged spleen large spleen has been there previously. Patient's white blood count is elevated has been elevated over 20,000 the past differential is normal. Not find any focus of infection to explain the above findings. As radiologist mentioned there is possibility of cancer. We'll have the patient follow-up with the cancer center they're probably best equipped to evaluate the possibility of cancer and find an occult infection. Patient does have a low-grade fever as well. ____________________________________________   FINAL CLINICAL IMPRESSION(S) /  ED DIAGNOSES  Final diagnoses:  Cramp of both lower extremities  Lymphadenitis      Nena Polio, MD 07/25/15 [phone]

## 2015-07-25 NOTE — ED Notes (Signed)
Pt admits to acute onset of lower back pain that pt describes as back spasms that radiates down to his thighs and up to his neck, pt admits to hx of kidney stones and states this pain is similar. Pt is also experiencing n/v.

## 2015-07-27 LAB — URINE CULTURE: CULTURE: NO GROWTH

## 2015-07-31 ENCOUNTER — Inpatient Hospital Stay: Payer: 59 | Attending: Hematology and Oncology | Admitting: Hematology and Oncology

## 2015-07-31 VITALS — BP 136/79 | HR 87 | Temp 95.4°F | Wt 273.8 lb

## 2015-07-31 DIAGNOSIS — R109 Unspecified abdominal pain: Secondary | ICD-10-CM | POA: Diagnosis not present

## 2015-07-31 DIAGNOSIS — R509 Fever, unspecified: Secondary | ICD-10-CM | POA: Insufficient documentation

## 2015-07-31 DIAGNOSIS — Z79899 Other long term (current) drug therapy: Secondary | ICD-10-CM

## 2015-07-31 DIAGNOSIS — R161 Splenomegaly, not elsewhere classified: Secondary | ICD-10-CM | POA: Insufficient documentation

## 2015-07-31 DIAGNOSIS — Z87442 Personal history of urinary calculi: Secondary | ICD-10-CM | POA: Diagnosis not present

## 2015-07-31 DIAGNOSIS — R599 Enlarged lymph nodes, unspecified: Secondary | ICD-10-CM

## 2015-07-31 DIAGNOSIS — R591 Generalized enlarged lymph nodes: Secondary | ICD-10-CM

## 2015-07-31 DIAGNOSIS — R5383 Other fatigue: Secondary | ICD-10-CM | POA: Diagnosis not present

## 2015-07-31 DIAGNOSIS — R61 Generalized hyperhidrosis: Secondary | ICD-10-CM | POA: Insufficient documentation

## 2015-07-31 NOTE — Progress Notes (Signed)
Patient here today as new evaluation.  Seen in ER last Friday.  States he was awakened with pain in the middle of the night with right sided flank pain. Felt like muscle spasms.  Unable to void.  Resolved now. CT scan  completed. Elevated WBC, enlarged spleen and lymph nodes and temp of 101.

## 2015-07-31 NOTE — Progress Notes (Signed)
[facility] day:  07/31/2015  Chief Complaint: Albert Sanders is a 49 y.o. male with an abnormal abdominal CT scan who is referred by Dr. Conni Slipper, [facility] ER physician, for assessment and management.  HPI: The patient states that he fell off a ladder about 4 months ago. He states that imaging studies revealed "blockage in the heart" as well as "adenopathy in the abdomen".  Abdominal and pelvic CT scan without contrast on 03/03/2015 revealed no evidence of traumatic injury.  There was however upper abdominal lymphadenopathy measuring up to 2 cm.  Spleen was upper limits of normal.  As the patient had been feeling well, he initially put off follow-up imaging studies. On Friday night, 07/24/2015, he awoke with flank, back, and posterior leg pain.  Pain was initially a level 2 and accelerated to a level of 8 or 9.  He experienced an episode of emesis, chills, and sweats. He called an ambulance. He presented to the [facility] emergency room.  Abdominal and pelvic CT scan without contrast revealed no renal stones or obstructive uropathy. He had abdominal adenopathy with the largest lymph node in the portacaval region measuring 1.9 cm (stable).  There was mild splenomegaly (13.5 x 15 cm).  He was treated with Dilaudid and discharged home.  The patient has had to 2 additional episodes of abdominal pain. He has been fatigued. He denies any weight loss. He has fevers with his back pain.  He has had some sweats.  He denies any infections.  Past Medical History  Diagnosis Date  . Kidney stone     Past Surgical History  Procedure Laterality Date  . Cholecystectomy    . Anterior cruciate ligament repair      No family history on file.  Social History:  reports that he has never smoked. His smokeless tobacco use includes Snuff. He reports that he drinks alcohol. He reports that he does not use illicit drugs.  He is a Development worker, community. He notes  exposure to various toxins. He dips 1 can a day. He rarely drinks alcohol. 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 The patient is accompanied by his wife, Anderson Malta, today.  Allergies:  Allergies  Allergen Reactions  . Contrast Media [Iodinated Diagnostic Agents] Anaphylaxis    Current Medications: Current Outpatient Prescriptions  Medication Sig Dispense Refill  . ibuprofen (ADVIL,MOTRIN) 100 MG tablet Take 100 mg by mouth every 6 (six) hours as needed for fever.    Marland Kitchen omeprazole (PRILOSEC OTC) 20 MG tablet Take 20 mg by mouth as needed (for heartburn).    Marland Kitchen acetaminophen (TYLENOL) 500 MG tablet Take 1,000 mg by mouth every 6 (six) hours as needed for moderate pain.    . cyclobenzaprine (FLEXERIL) 10 MG tablet Take 1 tablet (10 mg total) by mouth 2 (two) times daily as needed for muscle spasms. (Patient not taking: Reported on 07/31/2015) 20 tablet 0  . ondansetron (ZOFRAN) 4 MG tablet Take 1 tablet (4 mg total) by mouth every 6 (six) hours. (Patient not taking: Reported on 07/31/2015) 20 tablet 0  . orphenadrine (NORFLEX) 100 MG tablet Take 1 tablet (100 mg total) by mouth 2 (two) times daily as needed for muscle spasms. (Patient not taking: Reported on 07/31/2015) 10 tablet 0  . oxyCODONE (ROXICODONE) 5 MG immediate release tablet Take 1-2 tablets (5-10 mg total) by mouth every 4 (four) hours as needed for severe pain. (Patient not taking: Reported on 07/31/2015) 40 tablet 0  .  oxyCODONE-acetaminophen (PERCOCET/ROXICET) 5-325 MG per tablet Take 1 tablet by mouth every 4 (four) hours as needed for severe pain. (Patient not taking: Reported on 07/31/2015) 15 tablet 0   No current facility-administered medications for this visit.    Review of Systems:  GENERAL:  Less uumph.  Some fever and sweats.  No weight loss. PERFORMANCE STATUS (ECOG):  0 HEENT:  Visual changes.  Two episodes of sore throat.  No runny nose, mouth sores or tenderness. Lungs: No shortness of breath or cough.  No hemoptysis. Cardiac:   Chest pain with exertion from "time to time".  No palpitations, orthopnea, or PND. GI:  Terrible bowels.  Slightly more constipated.  Recently took 2 bottle of magnesium citrate and an enema.  No nausea, vomiting, diarrhea, melena or hematochezia. GU:  No urgency, frequency, dysuria, or hematuria. Musculoskeletal:  No back pain.  No joint pain.  No muscle tenderness. Extremities:  Right leg and knee discomfort since fall off of ladder.  No swelling. Skin:  No rashes or skin changes. Neuro:  No headache, numbness or weakness, balance or coordination issues. Endocrine:  No diabetes, thyroid issues, hot flashes or night sweats. Psych:  No mood changes, depression or anxiety. Pain:  No focal pain. Review of systems:  All other systems reviewed and found to be negative.  Physical Exam: Blood pressure 136/79, pulse 87, temperature 95.4 F (35.2 C), temperature source Tympanic, weight 273 lb 13 oz (124.2 kg). GENERAL:  Well developed, well nourished, sitting comfortably in the exam room in no acute distress. MENTAL STATUS:  Alert and oriented to person, place and time. HEAD:  Short crew cut hair.  Normocephalic, atraumatic, face symmetric, no Cushingoid features. EYES:  Blue eyes.  Pupils equal round and reactive to light and accomodation.  No conjunctivitis or scleral icterus. ENT:  Oropharynx clear without lesion.  Tongue normal. Mucous membranes moist.  RESPIRATORY:  Clear to auscultation without rales, wheezes or rhonchi. CARDIOVASCULAR:  Regular rate and rhythm without murmur, rub or gallop. ABDOMEN:  Soft, non-tender, with active bowel sounds, and no hepatosplenomegaly.  No masses. SKIN:  No rashes, ulcers or lesions. EXTREMITIES: No edema, no skin discoloration or tenderness.  No palpable cords. LYMPH NODES: Small left axillary node.  No palpable cervical, supraclavicular or inguinal adenopathy. NEUROLOGIC:  Unremarkable. PSYCH:  Appropriate.  No visits with results within 3 Day(s) from  this visit. Latest known visit with results is:  Admission on 07/25/2015, Discharged on 07/25/2015  Component Date Value Ref Range Status  . WBC 07/25/2015 19.4* 3.8 - 10.6 K/uL Final  . RBC 07/25/2015 5.36  4.40 - 5.90 MIL/uL Final  . Hemoglobin 07/25/2015 14.0  13.0 - 18.0 g/dL Final  . HCT 07/25/2015 44.4  40.0 - 52.0 % Final  . MCV 07/25/2015 82.8  80.0 - 100.0 fL Final  . MCH 07/25/2015 26.2  26.0 - 34.0 pg Final  . MCHC 07/25/2015 31.6* 32.0 - 36.0 g/dL Final  . RDW 07/25/2015 14.7* 11.5 - 14.5 % Final  . Platelets 07/25/2015 213  150 - 440 K/uL Final  . Specimen Description 07/25/2015 URINE, CLEAN CATCH   Final  . Special Requests 07/25/2015 NONE   Final  . Culture 07/25/2015 NO GROWTH   Final  . Report Status 07/25/2015 07/27/2015 FINAL   Final  . Color, Urine 07/25/2015 AMBER* YELLOW Final  . APPearance 07/25/2015 CLEAR* CLEAR Final  . Glucose, UA 07/25/2015 NEGATIVE  NEGATIVE mg/dL Final  . Bilirubin Urine 07/25/2015 NEGATIVE  NEGATIVE Final  .  Ketones, ur 07/25/2015 NEGATIVE  NEGATIVE mg/dL Final  . Specific Gravity, Urine 07/25/2015 1.030  1.005 - 1.030 Final  . Hgb urine dipstick 07/25/2015 NEGATIVE  NEGATIVE Final  . pH 07/25/2015 5.0  5.0 - 8.0 Final  . Protein, ur 07/25/2015 NEGATIVE  NEGATIVE mg/dL Final  . Nitrite 07/25/2015 NEGATIVE  NEGATIVE Final  . Leukocytes, UA 07/25/2015 NEGATIVE  NEGATIVE Final  . RBC / HPF 07/25/2015 0-5  0 - 5 RBC/hpf Final  . WBC, UA 07/25/2015 0-5  0 - 5 WBC/hpf Final  . Bacteria, UA 07/25/2015 NONE SEEN  NONE SEEN Final  . Squamous Epithelial / LPF 07/25/2015 0-5* NONE SEEN Final  . Mucous 07/25/2015 PRESENT   Final  . Hyaline Casts, UA 07/25/2015 PRESENT   Final  . Sodium 07/25/2015 139  135 - 145 mmol/L Final  . Potassium 07/25/2015 4.0  3.5 - 5.1 mmol/L Final  . Chloride 07/25/2015 102  101 - 111 mmol/L Final  . CO2 07/25/2015 28  22 - 32 mmol/L Final  . Glucose, Bld 07/25/2015 114* 65 - 99 mg/dL Final  . BUN 07/25/2015 20  6  - 20 mg/dL Final  . Creatinine, Ser 07/25/2015 1.29* 0.61 - 1.24 mg/dL Final  . Calcium 07/25/2015 9.1  8.9 - 10.3 mg/dL Final  . Total Protein 07/25/2015 7.4  6.5 - 8.1 g/dL Final  . Albumin 07/25/2015 4.1  3.5 - 5.0 g/dL Final  . AST 07/25/2015 25  15 - 41 U/L Final  . ALT 07/25/2015 25  17 - 63 U/L Final  . Alkaline Phosphatase 07/25/2015 86  38 - 126 U/L Final  . Total Bilirubin 07/25/2015 1.0  0.3 - 1.2 mg/dL Final  . GFR calc non Af Amer 07/25/2015 >60  >60 mL/min Final  . GFR calc Af Amer 07/25/2015 >60  >60 mL/min Final   Comment: (NOTE) The eGFR has been calculated using the CKD EPI equation. This calculation has not been validated in all clinical situations. eGFR's persistently <60 mL/min signify possible Chronic Kidney Disease.   . Anion gap 07/25/2015 9  5 - 15 Final  . WBC 51/12/5850 DUPLICATE REQUEST  4.0 - 10.5 K/uL Final  . RBC 77/82/4235 DUPLICATE REQUEST  3.61 - 5.81 MIL/uL Final  . Hemoglobin 44/31/5400 DUPLICATE REQUEST  86.7 - 17.0 g/dL Final  . HCT 61/95/0932 DUPLICATE REQUEST  67.1 - 52.0 % Final  . MCV 24/58/0998 DUPLICATE REQUEST  33.8 - 100.0 fL Final  . [facility] 25/03/3975 DUPLICATE REQUEST  73.4 - 34.0 pg Final  . MCHC 19/37/9024 DUPLICATE REQUEST  09.7 - 36.0 g/dL Final  . RDW 35/32/9924 DUPLICATE REQUEST  26.8 - 15.5 % Final  . Platelets 34/19/6222 DUPLICATE REQUEST  979 - 400 K/uL Final  . Neutrophils Relative % 07/25/2015 62   Final  . Neutro Abs 07/25/2015 12.2* 1.4 - 6.5 K/uL Final  . Lymphocytes Relative 07/25/2015 34   Final  . Lymphs Abs 07/25/2015 6.7* 1.0 - 3.6 K/uL Final  . Monocytes Relative 07/25/2015 3   Final  . Monocytes Absolute 07/25/2015 0.5  0.2 - 1.0 K/uL Final  . Eosinophils Relative 07/25/2015 1   Final  . Eosinophils Absolute 07/25/2015 0.1  0 - 0.7 K/uL Final  . Basophils Relative 07/25/2015 0   Final  . Basophils Absolute 07/25/2015 0.0  0 - 0.1 K/uL Final  . Troponin I 07/25/2015 <0.03  <0.031 ng/mL Final   Comment:        NO  INDICATION OF MYOCARDIAL INJURY.   Marland Kitchen  Magnesium 07/25/2015 1.6* 1.7 - 2.4 mg/dL Final    Assessment:  RUDI BUNYARD is a 49 y.o. male with abdominal adenopathy and mild splenomegaly first noted on CT scan in 02/2015 following a fall.  He recently represented with abdominal pain, fever, and sweats.  Abdominal and pelvic CT scan without contrast on 07/25/2015 revealed abdominal adenopathy with the largest lymph node in the portacaval region measuring 1.9 cm (stable).  There was mild splenomegaly (13.5 x 15 cm).    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  Symptomatically, he has been fatigued.  Exam reveals a small left axillary lymph node.  Plan: 1. Discuss imaging studies and unclear significance of abdominal adenopathy and splenomegaly.  Discuss differential diagnosis including reactive nodes, lymphoma, or other malignancy.  Discuss screening laboratory testing as well as PET scan.  Discuss possible lymph node biopsy based on scans. 2. Labs:  CBC with diff, CMP, LDH, uric acid, ESR, SPEP, immunoglobulins. 3. PET scan- assess extent and activity of adenopathy and splenomegaly.  B symptoms. 4. RTC after above.   Lequita Asal, MD  07/31/2015, 2:29 PM

## 2015-08-03 ENCOUNTER — Encounter: Payer: Self-pay | Admitting: Hematology and Oncology

## 2015-08-07 ENCOUNTER — Inpatient Hospital Stay: Payer: 59

## 2015-08-07 ENCOUNTER — Encounter
Admission: RE | Admit: 2015-08-07 | Discharge: 2015-08-07 | Disposition: A | Discharge status: Home / Self Care | Payer: Self-pay | Source: Ambulatory Visit | Attending: Hematology and Oncology | Admitting: Hematology and Oncology

## 2015-08-07 DIAGNOSIS — D3501 Benign neoplasm of right adrenal gland: Secondary | ICD-10-CM | POA: Insufficient documentation

## 2015-08-07 DIAGNOSIS — R61 Generalized hyperhidrosis: Secondary | ICD-10-CM | POA: Insufficient documentation

## 2015-08-07 DIAGNOSIS — I2510 Atherosclerotic heart disease of native coronary artery without angina pectoris: Secondary | ICD-10-CM | POA: Insufficient documentation

## 2015-08-07 DIAGNOSIS — R161 Splenomegaly, not elsewhere classified: Secondary | ICD-10-CM

## 2015-08-07 DIAGNOSIS — R591 Generalized enlarged lymph nodes: Secondary | ICD-10-CM

## 2015-08-07 DIAGNOSIS — R599 Enlarged lymph nodes, unspecified: Secondary | ICD-10-CM

## 2015-08-07 LAB — CBC WITH DIFFERENTIAL/PLATELET
Basophils Absolute: 0 K/uL (ref 0–0.1)
Basophils Relative: 0 %
Eosinophils Absolute: 0.2 K/uL (ref 0–0.7)
Eosinophils Relative: 2 %
HCT: 43 % (ref 40.0–52.0)
Hemoglobin: 13.9 g/dL (ref 13.0–18.0)
Lymphocytes Relative: 42 %
Lymphs Abs: 5.5 K/uL — ABNORMAL HIGH (ref 1.0–3.6)
MCH: 26.3 pg (ref 26.0–34.0)
MCHC: 32.3 g/dL (ref 32.0–36.0)
MCV: 81.4 fL (ref 80.0–100.0)
Monocytes Absolute: 0.8 K/uL (ref 0.2–1.0)
Monocytes Relative: 6 %
Neutro Abs: 6.5 K/uL (ref 1.4–6.5)
Neutrophils Relative %: 50 %
Platelets: 200 K/uL (ref 150–440)
RBC: 5.28 MIL/uL (ref 4.40–5.90)
RDW: 14.1 % (ref 11.5–14.5)
WBC: 13 K/uL — ABNORMAL HIGH (ref 3.8–10.6)

## 2015-08-07 LAB — COMPREHENSIVE METABOLIC PANEL
ALT: 26 U/L (ref 17–63)
AST: 27 U/L (ref 15–41)
Albumin: 3.8 g/dL (ref 3.5–5.0)
Alkaline Phosphatase: 88 U/L (ref 38–126)
Anion gap: 4 — ABNORMAL LOW (ref 5–15)
BUN: 21 mg/dL — ABNORMAL HIGH (ref 6–20)
CO2: 29 mmol/L (ref 22–32)
Calcium: 8.4 mg/dL — ABNORMAL LOW (ref 8.9–10.3)
Chloride: 102 mmol/L (ref 101–111)
Creatinine, Ser: 0.84 mg/dL (ref 0.61–1.24)
GFR calc Af Amer: >60 mL/min (ref >60)
GFR calc non Af Amer: >60 mL/min (ref >60)
Glucose, Bld: 121 mg/dL — ABNORMAL HIGH (ref 65–99)
Potassium: 4.3 mmol/L (ref 3.5–5.1)
Sodium: 135 mmol/L (ref 135–145)
Total Bilirubin: 0.5 mg/dL (ref 0.3–1.2)
Total Protein: 7.1 g/dL (ref 6.5–8.1)

## 2015-08-07 LAB — GLUCOSE, CAPILLARY: Glucose-Capillary: 103 mg/dL — ABNORMAL HIGH (ref 65–99)

## 2015-08-07 LAB — URIC ACID: Uric Acid, Serum: 5.7 mg/dL (ref 4.4–7.6)

## 2015-08-07 LAB — LACTATE DEHYDROGENASE: LDH: 135 U/L (ref 98–192)

## 2015-08-07 LAB — SEDIMENTATION RATE: Sed Rate: 5 mm/hr (ref 0–15)

## 2015-08-07 MED ORDER — FLUDEOXYGLUCOSE F - 18 (FDG) INJECTION
13.1900 | Freq: Once | INTRAVENOUS | Status: DC | PRN
  Administered 2015-08-07: 13.19 via INTRAVENOUS
  Filled 2015-08-07: qty 13.19

## 2015-08-08 LAB — IGG, IGA, IGM
IgA: 253 mg/dL (ref 90–386)
IgG (Immunoglobin G), Serum: 1076 mg/dL (ref 700–1600)
IgM, Serum: 110 mg/dL (ref 20–172)

## 2015-08-10 ENCOUNTER — Inpatient Hospital Stay (HOSPITAL_BASED_OUTPATIENT_CLINIC_OR_DEPARTMENT_OTHER): Payer: 59 | Admitting: Hematology and Oncology

## 2015-08-10 VITALS — BP 114/75 | HR 74 | Temp 98.2°F | Wt 276.9 lb

## 2015-08-10 DIAGNOSIS — R5383 Other fatigue: Secondary | ICD-10-CM | POA: Diagnosis not present

## 2015-08-10 DIAGNOSIS — R599 Enlarged lymph nodes, unspecified: Secondary | ICD-10-CM | POA: Diagnosis not present

## 2015-08-10 DIAGNOSIS — R591 Generalized enlarged lymph nodes: Secondary | ICD-10-CM

## 2015-08-10 DIAGNOSIS — Z79899 Other long term (current) drug therapy: Secondary | ICD-10-CM

## 2015-08-10 DIAGNOSIS — R161 Splenomegaly, not elsewhere classified: Secondary | ICD-10-CM | POA: Diagnosis not present

## 2015-08-10 DIAGNOSIS — R109 Unspecified abdominal pain: Secondary | ICD-10-CM | POA: Diagnosis not present

## 2015-08-10 DIAGNOSIS — R509 Fever, unspecified: Secondary | ICD-10-CM

## 2015-08-10 LAB — PROTEIN ELECTROPHORESIS, SERUM
A/G Ratio: 1.1 (ref 0.7–1.7)
Albumin ELP: 3.3 g/dL (ref 2.9–4.4)
Alpha-1-Globulin: 0.2 g/dL (ref 0.0–0.4)
Alpha-2-Globulin: 0.7 g/dL (ref 0.4–1.0)
Beta Globulin: 1 g/dL (ref 0.7–1.3)
Gamma Globulin: 1.1 g/dL (ref 0.4–1.8)
Globulin, Total: 3 g/dL (ref 2.2–3.9)
Total Protein ELP: 6.3 g/dL (ref 6.0–8.5)

## 2015-08-10 NOTE — Progress Notes (Signed)
Patient here for follow up, patient states that he has had increased reflux and has experienced episodes of chest pains or tightness in his chest along with SOB not related to increased activity

## 2015-08-10 NOTE — Progress Notes (Signed)
[facility] day:  08/10/2015  Chief Complaint: Albert Sanders is a 49 y.o. male with an abnormal abdominal CT scan who is seen for review of interval studies and discussion regarding direction of therapy.  HPI: The patient was last seen in the medical oncology clinic on 07/31/2015.  At that time, he was seen for initial consultation regarding abdominal adenopathy and mild splenomegaly first noted on CT scan in 02/2015 after a fall.  He then represented with abdominal pain, fever, and sweats.  Abdominal and pelvic CT scan without contrast on 07/25/2015 revealed abdominal adenopathy with the largest lymph node in the portacaval region measuring 1.9 cm (stable).  There was mild splenomegaly (15 cm).  Symptomatically, he noted some fatigue.  Exam reveald a small left axillary lymph node.  Labs on 08/07/2015 revealed a hematocrit of 43, hemoglobin 13.9, platelets 200,000, white count 13,000 with an ANC of 500. Differential included 50% segs and 42% lymphs. There was mild lymphocytosis (5500).  Comprehensive metabolic, LDH, uric acid, sedimentation rate, serum protein electrophoresis, and immunoglobulins were normal.  PET scan on 08/07/2015 revealed no hypermetabolic lymphadenopathy or metastatic disease. He had a top normal spleen size. There was a focal hypermetabolism within a 1 cm superior right adrenal nodule (SUV 6.4; density 9HU) unchanged since 08/05/2011 and felt consistent with a hypermetabolic benign adrenal adenoma. Recommendation were follow-up abdominal and pelvic CT scan in 6-12 months.  Symptomatically, he notes no night sweats lately.  Energy level remains poor. He notes a little bit of shortness of breath. He notes some chest pain over the past week but not necessarily with exertion.  Past Medical History  Diagnosis Date  . Kidney stone     Past Surgical History  Procedure Laterality Date  . Cholecystectomy    . Anterior cruciate  ligament repair      No family history on file.  Social History:  reports that he has never smoked. His smokeless tobacco use includes Snuff. He reports that he drinks alcohol. He reports that he does not use illicit drugs.  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 He dips 1 can a day. He rarely drinks alcohol. 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 The patient is accompanied by women today.  Allergies:  Allergies  Allergen Reactions  . Contrast Media [Iodinated Diagnostic Agents] Anaphylaxis    Current Medications: Current Outpatient Prescriptions  Medication Sig Dispense Refill  . acetaminophen (TYLENOL) 500 MG tablet Take 1,000 mg by mouth every 6 (six) hours as needed for moderate pain.    . cyclobenzaprine (FLEXERIL) 10 MG tablet Take 1 tablet (10 mg total) by mouth 2 (two) times daily as needed for muscle spasms. (Patient not taking: Reported on 07/31/2015) 20 tablet 0  . ibuprofen (ADVIL,MOTRIN) 100 MG tablet Take 100 mg by mouth every 6 (six) hours as needed for fever.    Marland Kitchen omeprazole (PRILOSEC OTC) 20 MG tablet Take 20 mg by mouth as needed (for heartburn).    . ondansetron (ZOFRAN) 4 MG tablet Take 1 tablet (4 mg total) by mouth every 6 (six) hours. (Patient not taking: Reported on 07/31/2015) 20 tablet 0  . orphenadrine (NORFLEX) 100 MG tablet Take 1 tablet (100 mg total) by mouth 2 (two) times daily as needed for muscle spasms. (Patient not taking: Reported on 07/31/2015) 10 tablet 0  . oxyCODONE (ROXICODONE) 5 MG immediate release tablet Take 1-2 tablets (5-10 mg total) by mouth every 4 (four)  hours as needed for severe pain. (Patient not taking: Reported on 07/31/2015) 40 tablet 0  . oxyCODONE-acetaminophen (PERCOCET/ROXICET) 5-325 MG per tablet Take 1 tablet by mouth every 4 (four) hours as needed for severe pain. (Patient not taking: Reported on 07/31/2015) 15 tablet 0   No current facility-administered medications for this visit.   Facility-Administered Medications Ordered  in Other Visits  Medication Dose Route Frequency Provider Last Rate Last Dose  . fludeoxyglucose F - 18 (FDG) injection 13.19 milli Curie  13.19 milli Curie Intravenous Once PRN Medication Radiologist, MD   13.19 milli Curie at 08/07/15 1040    Review of Systems:  GENERAL:  Poor energy.  No fever or sweats.  No weight loss. PERFORMANCE STATUS (ECOG):  0 HEENT:  No runny nose, mouth sores or tenderness. Lungs: No shortness of breath or cough.  No hemoptysis. Cardiac:  Chest pain with exertion from "time to time".  No palpitations, orthopnea, or PND. GI:  No nausea, vomiting, diarrhea, melena or hematochezia. GU:  No urgency, frequency, dysuria, or hematuria. Musculoskeletal:  No back pain.  No joint pain.  No muscle tenderness. Extremities:  Right leg and knee discomfort since fall off of ladder.  No swelling. Skin:  No rashes or skin changes. Neuro:  No headache, numbness or weakness, balance or coordination issues. Endocrine:  No diabetes, thyroid issues, hot flashes or night sweats. Psych:  No mood changes, depression or anxiety. Pain:  No focal pain. Review of systems:  All other systems reviewed and found to be negative.  Physical Exam: Blood pressure 114/75, pulse 74, temperature 98.2 F (36.8 C), temperature source Oral, weight 276 lb 14.4 oz (125.6 kg), SpO2 98 %. GENERAL:  Well developed, well nourished, sitting comfortably in the exam room in no acute distress. MENTAL STATUS:  Alert and oriented to person, place and time. HEAD:  Short crew cut hair.  Normocephalic, atraumatic, face symmetric, no Cushingoid features. EYES:  Blue eyes.  No conjunctivitis or scleral icterus. PSYCH:  Appropriate.  No visits with results within 3 Day(s) from this visit. Latest known visit with results is:  Hospital Outpatient Visit on 08/07/2015  Component Date Value Ref Range Status  . Glucose-Capillary 08/07/2015 103* 65 - 99 mg/dL Final    Assessment:  Albert Sanders is a 49 y.o. male  with abdominal adenopathy and mild splenomegaly first noted on CT scan in 02/2015 following a fall.  He recently represented with abdominal pain, fever, and sweats.  Abdominal and pelvic CT scan without contrast on 07/25/2015 revealed abdominal adenopathy with the largest lymph node in the portacaval region measuring 1.9 cm (stable).  There was mild splenomegaly (13.5 x 15 cm).    Labs on 08/07/2015 revealed a hematocrit of 43, hemoglobin 13.9, platelets 200,000, white count 13,000 with an ANC of 500. Differential included 50% segs and 42% lymphs with a mild lymphocytosis (5500).  Comprehensive metabolic, LDH, uric acid, sedimentation rate, SPEP, and immunoglobulins were normal.  PET scan on 08/07/2015 revealed no hypermetabolic lymphadenopathy or metastatic disease. There was a mildly enlarged gastrohepatic ligament, portacaval, and right external iliac lymph nodes (1.3-1.9 cm).  He had a top normal spleen size.   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  Symptomatically, he remains fatigued.  He has had no B symptoms.  Plan: 1. Discuss labs and PET scan.  Discuss possible low grade lymphoma.  Discuss lymph nodes appear too small for biopsy. Discuss presentation at tumor board. Recommend follow-up abdominal pelvic CT scan in  6 months as he is asymptomatic.  Low-grade lymphomas are often followed without intervention if asymptomatic. 2. Patient to contact clinic if concerning symptoms (fevers, sweats, weight loss, bruising/bleeding, recurrent infections or not doing well). 3. Schedule abdominal/pelvic CT scan on 02/04/2016- follow-up adenopathy. 4. RCT after CT scan for MD assess, labs (CBC with diff, CMP, LDH, uric acid), and review of scans.  Addendum:  Tumor board recommended no biopsy (either CT guided or laparoscopic).  Recommended sending blood for peripheral flow cytometry.   Lequita Asal, MD  08/10/2015, 10:14 AM

## 2015-08-12 ENCOUNTER — Telehealth: Payer: Self-pay | Admitting: *Deleted

## 2015-08-12 NOTE — Telephone Encounter (Signed)
Called pt to go over his lab results. Told him no M spike which indicates he does not have MM. WBC is elevated at 13. Kidney function is slightly elevated. Pt responded" Okay BUT WHAT IS THE NEXT STEP??? "Something is definitely going on"." I need to get to the bottom of this". Patient is not comfortable waiting 6 months for another CT scan and MD visit. He is having L flank pain. He woke up this am with cold sweats. He would venture to say he had a fever but did not actually take his temperature. His appetite is feast or famine. He wants me to relay this to Dr Mike Gip and expects a return phone call or appt.

## 2015-08-18 ENCOUNTER — Telehealth: Payer: Self-pay

## 2015-08-18 NOTE — Telephone Encounter (Signed)
Called pt per MD to let pt know he had been placed on tumor board for further discussion in relation to see if a minimally enlarged node would or could be biopsied.  Pts voicemail is full.  MD aware

## 2015-08-20 ENCOUNTER — Inpatient Hospital Stay: Payer: 59

## 2015-08-20 ENCOUNTER — Telehealth: Payer: Self-pay | Admitting: Hematology and Oncology

## 2015-08-20 ENCOUNTER — Telehealth: Payer: Self-pay

## 2015-08-20 ENCOUNTER — Other Ambulatory Visit: Payer: Self-pay | Admitting: Hematology and Oncology

## 2015-08-20 DIAGNOSIS — R599 Enlarged lymph nodes, unspecified: Secondary | ICD-10-CM

## 2015-08-20 DIAGNOSIS — R591 Generalized enlarged lymph nodes: Secondary | ICD-10-CM

## 2015-08-20 DIAGNOSIS — C9110 Chronic lymphocytic leukemia of B-cell type not having achieved remission: Secondary | ICD-10-CM | POA: Insufficient documentation

## 2015-08-20 DIAGNOSIS — D72820 Lymphocytosis (symptomatic): Secondary | ICD-10-CM | POA: Insufficient documentation

## 2015-08-20 NOTE — Telephone Encounter (Signed)
Re:  Tumor board  Called patient about tumor board discussions.  No plan to biopsy small lymph node by surgery or interventional radiology (CT guided).  Mild lymphocytosis noted on labs.    Discussed sending blood for flow cytometry.  Consider bone marrow.  Patient to come in today for labs (flow cytometry).  Lequita Asal, MD

## 2015-08-20 NOTE — Telephone Encounter (Signed)
Pt came into clinic for labs today.  I went down to lab and spoke with pt per his request.  Pt reports left upper quadrant and left back flank pain.  Pt's pain is in the same area as reported before in clinic that felt like gas pain.  I asked pt if he felt it was still the same and he reported no that it was not.  I asked pt that if he felt that it was bad he should go to the ER we do not have any equipment here in office that can tell us where that pain is coming from.  MD aware.  Pt stated he was home today with his sick son with respiratory issues.  Pt shook head only when I asked him if he had any N/V but says he knows too a virus is going around.  I reminded him that if he thought this was something that needed immediate attention he would need to go to the ER.

## 2015-08-24 LAB — COMP PANEL: LEUKEMIA/LYMPHOMA: Immunophenotypic Profile: 27

## 2015-08-27 ENCOUNTER — Encounter: Payer: Self-pay | Admitting: Hematology and Oncology

## 2015-08-28 ENCOUNTER — Other Ambulatory Visit: Payer: Self-pay | Admitting: Hematology and Oncology

## 2015-08-28 ENCOUNTER — Inpatient Hospital Stay: Payer: 59

## 2015-08-28 ENCOUNTER — Inpatient Hospital Stay (HOSPITAL_BASED_OUTPATIENT_CLINIC_OR_DEPARTMENT_OTHER): Payer: 59 | Admitting: Hematology and Oncology

## 2015-08-28 VITALS — BP 128/82 | HR 74 | Temp 97.0°F | Wt 283.7 lb

## 2015-08-28 DIAGNOSIS — R232 Flushing: Secondary | ICD-10-CM

## 2015-08-28 DIAGNOSIS — C9110 Chronic lymphocytic leukemia of B-cell type not having achieved remission: Secondary | ICD-10-CM

## 2015-08-28 DIAGNOSIS — R599 Enlarged lymph nodes, unspecified: Secondary | ICD-10-CM | POA: Diagnosis not present

## 2015-08-28 DIAGNOSIS — R5383 Other fatigue: Secondary | ICD-10-CM

## 2015-08-28 DIAGNOSIS — R161 Splenomegaly, not elsewhere classified: Secondary | ICD-10-CM | POA: Diagnosis not present

## 2015-08-28 DIAGNOSIS — R109 Unspecified abdominal pain: Secondary | ICD-10-CM

## 2015-08-28 DIAGNOSIS — Z79899 Other long term (current) drug therapy: Secondary | ICD-10-CM

## 2015-08-28 LAB — URINALYSIS COMPLETE WITH MICROSCOPIC (ARMC ONLY)
Bacteria, UA: NONE SEEN
Bilirubin Urine: NEGATIVE
Glucose, UA: NEGATIVE mg/dL
Ketones, ur: NEGATIVE mg/dL
Leukocytes, UA: NEGATIVE
Nitrite: NEGATIVE
Protein, ur: NEGATIVE mg/dL
Specific Gravity, Urine: 1.024 (ref 1.005–1.030)
pH: 6 (ref 5.0–8.0)

## 2015-08-28 LAB — PROTIME-INR
INR: 0.96
Prothrombin Time: 13 seconds (ref 11.4–15.0)

## 2015-08-28 NOTE — Progress Notes (Signed)
Patient complains of left sided flank pain 7/10 today. States he hurts in his scrotum when he has this pain.  Does not hurt all the time - comes and goes.  Also notes that he has a weak urine stream.  If he bears down he can force the stream.

## 2015-08-28 NOTE — Progress Notes (Signed)
[facility] day:  08/28/2015  Chief Complaint: Albert Sanders is a 49 y.o. male with an abnormal abdominal CT scan who is seen for review of interval studies and discussion regarding direction of therapy.  HPI: The patient was last seen in the medical oncology clinic on 08/10/2015.  At that time, PET scan from 08/07/2015 had revealed no hypermetabolic lymphadenopathy or metastatic disease. He had a top normal spleen size. He was felt possibly to have a low-grade lymphoma. Recommendation was for follow-up abdominal and pelvic CT scan in 6 months.  Blood was collected for flow cytometry on 08/20/2015. 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  There was a gamma-delta T-cell lymphocytosis representing 2.2% of lymphocytes.  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  Symptomatically, he notes pain in his left kidney. He states that it is intermittent. His scrotum hurts at the same time. This  has occurred 2-3 times, and overall 5-6 times over the past 1 to 1-1/2 months. He denies any blood in his urine. He notes a foul smell to his urine. He notes no urgency or frequency.  He states he has a fever when he has back pain on the left side. He sweats sometimes in the morning and occasionally at night. 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  He believes that his testosterone is low.  Past Medical History  Diagnosis Date  . Kidney stone     Past Surgical History  Procedure Laterality Date  . Cholecystectomy    . Anterior cruciate ligament repair      No family history on file.  Social History:  reports that he has never smoked. His smokeless tobacco  use includes Snuff. He reports that he drinks alcohol. He reports that he does not use illicit drugs.  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 He dips 1 can a day. He rarely drinks alcohol. 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   Allergies:  Allergies  Allergen Reactions  . Contrast Media [Iodinated Diagnostic Agents] Anaphylaxis    Current Medications: Current Outpatient Prescriptions  Medication Sig Dispense Refill  . ibuprofen (ADVIL,MOTRIN) 100 MG tablet Take 100 mg by mouth every 6 (six) hours as needed for fever.    Marland Kitchen omeprazole (PRILOSEC OTC) 20 MG tablet Take 20 mg by mouth as needed (for heartburn).    Marland Kitchen acetaminophen (TYLENOL) 500 MG tablet Take 1,000 mg by mouth every 6 (six) hours as needed for moderate pain.    . cyclobenzaprine (FLEXERIL) 10 MG tablet Take 1 tablet (10 mg total) by mouth 2 (two) times daily as needed for muscle spasms. (Patient not taking: Reported on 07/31/2015) 20 tablet 0  . ondansetron (ZOFRAN) 4 MG tablet Take 1 tablet (4 mg total) by mouth every 6 (six) hours. (Patient not taking: Reported on 07/31/2015) 20 tablet 0  . orphenadrine (NORFLEX) 100 MG tablet Take 1 tablet (100 mg total) by mouth 2 (two) times daily as needed for muscle spasms. (Patient not taking: Reported on 07/31/2015) 10 tablet 0  . oxyCODONE (ROXICODONE) 5 MG immediate release tablet Take 1-2 tablets (5-10 mg total) by mouth every 4 (four) hours as needed for severe  pain. (Patient not taking: Reported on 07/31/2015) 40 tablet 0  . oxyCODONE-acetaminophen (PERCOCET/ROXICET) 5-325 MG per tablet Take 1 tablet by mouth every 4 (four) hours as needed for severe pain. (Patient not taking: Reported on 07/31/2015) 15 tablet 0   No current facility-administered medications for this visit.    Review of Systems:  GENERAL:  Poor energy.  No fever.  Baseline "prolific sweater".  No weight loss. PERFORMANCE STATUS (ECOG):  0 HEENT:  No runny nose, mouth sores or tenderness. Lungs: No shortness of  breath or cough.  No hemoptysis. Cardiac:  Chest pain with exertion from "time to time".  No palpitations, orthopnea, or PND. GI:  No nausea, vomiting, diarrhea, melena or hematochezia. GU:  Left flank pain often associated with fevers.  No testicular swelling.  Believes testosterone is low.  Urine has foul odor.  No urgency, frequency, dysuria, or hematuria. Musculoskeletal:  No back pain.  No joint pain.  No muscle tenderness. Extremities:  Right leg and knee discomfort since fall off of ladder.  No swelling. Skin:  No rashes or skin changes. Neuro:  No headache, numbness or weakness, balance or coordination issues. Endocrine:  No diabetes, thyroid issues, hot flashes or night sweats. Psych:  No mood changes, depression or anxiety. Pain:  No focal pain. Review of systems:  All other systems reviewed and found to be negative.  Physical Exam: Blood pressure 128/82, pulse 74, temperature 97 F (36.1 C), temperature source Tympanic, weight 283 lb 11.7 oz (128.7 kg). GENERAL:  Well developed, well nourished, sitting comfortably in the exam room in no acute distress. MENTAL STATUS:  Alert and oriented to person, place and time. HEAD:  Short crew cut hair.  Normocephalic, atraumatic, face symmetric, no Cushingoid features. EYES:  Blue eyes.  Pupils equal round and reactive to light and accomodation.  No conjunctivitis or scleral icterus. ENT:  Oropharynx clear without lesion.  Tongue normal. Mucous membranes moist.  RESPIRATORY:  Clear to auscultation without rales, wheezes or rhonchi. CARDIOVASCULAR:  Regular rate and rhythm without murmur, rub or gallop. ABDOMEN:  Soft, non-tender, with active bowel sounds, and no hepatosplenomegaly.  No masses. BACK:  No CVA tenderness. SKIN:  No rashes, ulcers or lesions. EXTREMITIES: No edema, no skin discoloration or tenderness.  No palpable cords. LYMPH NODES: No palpable cervical, supraclavicular, axillary or inguinal adenopathy  NEUROLOGICAL:  Unremarkable. PSYCH:  Appropriate.   No visits with results within 3 Day(s) from this visit. Latest known visit with results is:  Clinical Support on 08/20/2015  Component Date Value Ref Range Status  . PATH INTERP XXX-IMP 08/20/2015 Comment   Final   Comment: (NOTE) 1) CD5 (+)/CD23 (+) clonal B-cell population, CLL/SLL phenotype, 27% of leukocytes, <5,000/uL, CD38 negative (See Comment) 2) Gamma-Delta T-cell lymphocytosis, representing 2.2% of leukocytes, and 31% of T-cells (see Comment)   . ANNOTATION COMMENT IMP 08/20/2015 Comment   Corrected   Comment: (NOTE) 1) The finding of a significant population of monoclonal B-cells, 3,510/uL, in the peripheral blood with the phenotype described that is less than the CLL threshold of 5,000/uL is indicative of either emerging B-cell chronic lymphocytic leukemia (B-CLL) or small lymphocytic lymphoma (SLL). Clinical correlation is required. If the patient has lymphadenopathy or an extranodal lymphomatous mass, the findings would be best categorized as SLL. In the absence of lymphadenopathy or extranodal mass, the findings are most characteristic of emerging B-CLL. An alternative categorization in this setting is "monoclonal B-lymphocytosis", favored by some for characterization of CLL-like lymphocytosis of less than 5,000/uL.  2) Clinical and morphologic correlation is required, and specialized testing, such as T cell receptor gene rearrangement may be indicated if there is a clinical suspicion of T-cell lymphoma.   Marland Kitchen CLINICAL INFO 08/20/2015 Comment   Corrected   Comment: (NOTE) Accompanying CBC dated 08-07-15 shows: WBC count 13.0, Neu 6.5, Lym 5.5   . Misc Source 08/20/2015 Comment   Final   Peripheral blood  . ASSESSMENT OF LEUKOCYTES 08/20/2015 Comment   Final   Comment: (NOTE) Immunophenotypic analysis demonstrates a population of monoclonal B-lymphocytes that are restricted to the dim expression of kappa light chain  immunoglobulin. The clonal B cells account for 27% of leukocytes. The clonal B-cells show expression of CD5 and CD23. Expression of CD20 is dim, and expression of CD22 is also dim or virtually negative. The cells are positive for CD11c and negative for FMC-7. The phenotype is typical of chronic lymphocytic leukemia/small lymphocytic lymphoma (CLL/SLL). CD38 is not expressed on the clonal B-cells. There is no loss of, or aberrant expression of, the pan T cell antigens to suggest a neoplastic T cell process. CD4:CD8 ratio 2.8 Gamma-delta (GD) T-cells are relatively increased, accounting for approximately 31% of T cells. Typically GD T-cells account for 1-3% of the T cell population. Increased GD T-cells may be seen in association with infectious processes as well as certain neoplastic processes. No circulating blasts                           are detected. There is no immunophenotypic evidence of abnormal myeloid maturation. Analysis of the leukocyte population shows: granulocytes 59%, monocytes 5%, lymphocytes 36%, blasts <0.5%, B cells 27%, T cells 8%, NK cells 1%.   . % Viable Cells 08/20/2015 Comment   Corrected   91%  . Immunophenotypic Profile 08/20/2015 27% of total cells (Phenotype below)   Corrected   Comment: Comment Abnormal cell population: present   . ANALYSIS AND GATING STRATEGY 08/20/2015 Comment   Final   8 color analysis with CD45/SSC  . IMMUNOPHENOTYPING STUDY 08/20/2015 Comment   Final   Comment: (NOTE) CD2       (-)            CD3       (-) CD4       (-)            CD5       (+) CD7       (-)            CD8       (-) CD10      (-)            CD11b     (-) CD11c     (+)            CD13      (-) CD14      (-)            CD15      (-) CD16      (-)            CD19      (+) CD20      (+) Dim        CD22      (+) Dim CD23      (+) Bright     CD33      (-) CD34      (-)  CD38      (-) CD45      (+)            CD56      (-) CD57      (-)             CD103     (-) CD117     (-)            alpha-betaIM** gamma-deltIM**           FMC-7     (-) HLA-DR    (+)            KAPPA     (+) Dim LAMBDA    (-)            CD64      (-)   . PATHOLOGIST NAME 08/20/2015 Comment   Final   Theda Sers, M.D.  . COMMENT: 08/20/2015 Comment   Corrected   Comment: (NOTE) Each antibody in this assay was utilized to assess for potential abnormalities of studied cell populations or to characterize identified abnormalities. This test was developed and its performance characteristics determined by [facility].  It has not been cleared or approved by the U.S. Food and Drug Administration. The FDA has determined that such clearance or approval is not necessary. This test is used for clinical purposes.  It should not be regarded as investigational or for research. Performed At: -[facility] [address] [place], [place] [id number] Nechama Guard MD XY:[id number] Performed At: [facility] [address] [place], [place] [id number] Nechama Guard MD GB:[id number]     Assessment:  CULLEN LAHAIE is a 49 y.o. male with abdominal adenopathy and mild splenomegaly first noted on CT scan in 02/2015 following a fall.  He represented with abdominal pain, fever, and sweats.  Abdominal and pelvic CT scan without contrast on 07/25/2015 revealed abdominal adenopathy with the largest lymph node in the portacaval region measuring 1.9 cm (stable).  There was mild splenomegaly (15 cm).  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  Labs on 08/07/2015 revealed a hematocrit of 43, hemoglobin 13.9,  platelets 200,000, white count 13,000 with an ANC of 500. Differential included 50% segs and 42% lymphs with a mild lymphocytosis (5500).  Comprehensive metabolic, LDH, uric acid, sedimentation rate, SPEP, and immunoglobulins were normal.  PET scan on 08/07/2015 revealed no hypermetabolic lymphadenopathy or metastatic disease. There was a mildly enlarged gastrohepatic ligament, portacaval, and right external iliac lymph nodes (1.3-1.9 cm).  He had a top normal spleen size.   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  Symptomatically, he remains fatigued.  He has had no B symptoms.  He has intermittent pain in his left flank and testicle.  Urine has a foul odor.  Plan: 1. Discuss flow cytometry and possible diagnosis of CLL.  Discuss indications for treatment (none at this time).  Discuss treatment options if/when treatment needed.  Discuss prognostic markers.  Discuss consideration of a possible bone marrow.  Patient in agreement. 2. Labs today:  testosterone level. 3. Urinalysis +/- culture. 4. Referral to urology. 5. Schedule bone marrow aspirate and biopsy.  Procedure described in detail. 6.   Anticipate follow-up CT scan in 6 months per prior plan. 7.   RTC 10-14 days after bone marrow aspirate and biopsy.   Lequita Asal, MD  08/28/2015, 10:24 AM

## 2015-08-29 HISTORY — PX: BONE MARROW BIOPSY: SHX199

## 2015-08-29 LAB — TESTOSTERONE: Testosterone: 113 ng/dL — ABNORMAL LOW (ref 348–1197)

## 2015-08-30 ENCOUNTER — Encounter: Payer: Self-pay | Admitting: Hematology and Oncology

## 2015-08-30 ENCOUNTER — Other Ambulatory Visit: Payer: Self-pay | Admitting: Hematology and Oncology

## 2015-09-02 ENCOUNTER — Ambulatory Visit (INDEPENDENT_AMBULATORY_CARE_PROVIDER_SITE_OTHER): Payer: PRIVATE HEALTH INSURANCE | Admitting: Obstetrics and Gynecology

## 2015-09-02 ENCOUNTER — Encounter: Payer: Self-pay | Admitting: Obstetrics and Gynecology

## 2015-09-02 VITALS — BP 119/78 | HR 74 | Resp 16 | Ht 73.0 in | Wt 280.9 lb

## 2015-09-02 DIAGNOSIS — R109 Unspecified abdominal pain: Secondary | ICD-10-CM | POA: Diagnosis not present

## 2015-09-02 DIAGNOSIS — R3129 Other microscopic hematuria: Secondary | ICD-10-CM

## 2015-09-02 LAB — MICROSCOPIC EXAMINATION
RBC, UA: NONE SEEN /hpf (ref 0–?)
RENAL EPITHEL UA: NONE SEEN /HPF

## 2015-09-02 LAB — URINALYSIS, COMPLETE
Bilirubin, UA: NEGATIVE
Ketones, UA: NEGATIVE
Leukocytes, UA: NEGATIVE
NITRITE UA: POSITIVE — AB
PH UA: 7 (ref 5.0–7.5)
RBC, UA: NEGATIVE
Specific Gravity, UA: 1.025 (ref 1.005–1.030)
UUROB: 1 mg/dL (ref 0.2–1.0)

## 2015-09-02 MED ORDER — HYDROCODONE-ACETAMINOPHEN 5-325 MG PO TABS: 1.0000 | ORAL_TABLET | Freq: Four times a day (QID) | ORAL | Status: DC | PRN

## 2015-09-02 NOTE — Progress Notes (Signed)
09/02/2015 3:14 PM   [name] September 05, 1966 [id number]  Referring provider: Guadalupe Maple, MD [address], [place] [address]  Chief Complaint  Patient presents with  . Flank Pain  . Establish Care    HPI:  Patient is a 49 year old male with a history of leukemia presenting today as a referral for left flank pain occurring intermittently for the last 1.5 months. He describes pain as dull and achy occurring every few days. Pain radiates down to left scrotum. Laying down and resting helps his pain. Standing up for long periods of times worsens pain. He has taken ibuprofen for the pain which he states did not lessen his discomfort. He reports pain being so severe at times that it is debilitating.   It is of note he also had microscopic hematuria (0-5) on 2 urinalysis within the last month. He does report noticing a slightly weaker stream over the last 2 months. He denies dysuria, gross hematuria, urgency, frequency or sensation of incomplete bladder emptying. No previous history of GU surgeries or infections. He was never a smoker.  Patient does report a history of kidney stones. Last episode greater than 1 year ago. He has not previously seen a urologist for stone episodes and states he has passed them all spontaneously.  H2O intake 2 glasses. Mt Dew 1 large glass daily.  CT renal stones study performed on 07/25/15 showing no renal stones or obstructive uropathy. No acute abnormalities were seen in the abdomen or pelvis.  9/9/6 Cr 0.84  Patient has allergy to contrast dye. Reaction history tongue swelling.  PMH: Past Medical History  Diagnosis Date  . Kidney stone     Surgical History: Past Surgical History  Procedure Laterality Date  . Cholecystectomy    . Anterior cruciate ligament repair      Home Medications:    Medication List       This list is accurate as of: 09/02/15 11:59 PM.  Always use your most recent med list.               HYDROcodone-acetaminophen 5-325 MG tablet  Commonly known as:  NORCO  Take 1 tablet by mouth every 6 (six) hours as needed for moderate pain.     ibuprofen 100 MG tablet  Commonly known as:  ADVIL,MOTRIN  Take 100 mg by mouth every 6 (six) hours as needed for fever.     omeprazole 20 MG tablet  Commonly known as:  PRILOSEC OTC  Take 20 mg by mouth as needed (for heartburn).        Allergies:  Allergies  Allergen Reactions  . Contrast Media [Iodinated Diagnostic Agents] Anaphylaxis    Family History: No family history on file.  Social History:  reports that he has never smoked. His smokeless tobacco use includes Snuff. He reports that he drinks alcohol. He reports that he does not use illicit drugs.  ROS: UROLOGY Frequent Urination?: No Hard to postpone urination?: No Burning/pain with urination?: No Get up at night to urinate?: Yes Leakage of urine?: No Urine stream starts and stops?: Yes Trouble starting stream?: Yes Do you have to strain to urinate?: Yes Blood in urine?: No Urinary tract infection?: No Sexually transmitted disease?: No Injury to kidneys or bladder?: No Painful intercourse?: No Weak stream?: Yes Erection problems?: Yes Penile pain?: No  Gastrointestinal Nausea?: No Vomiting?: No Indigestion/heartburn?: Yes Diarrhea?: No Constipation?: No  Constitutional Fever: Yes Night sweats?: Yes Weight loss?: No Fatigue?: Yes  Skin Skin  rash/lesions?: No Itching?: No  Eyes Blurred vision?: No Double vision?: No  Ears/Nose/Throat Sore throat?: No Sinus problems?: No  Hematologic/Lymphatic Swollen glands?: No Easy bruising?: No  Cardiovascular Leg swelling?: Yes Chest pain?: No  Respiratory Cough?: No Shortness of breath?: Yes  Endocrine Excessive thirst?: Yes  Musculoskeletal Back pain?: Yes Joint pain?: Yes  Neurological Headaches?: No Dizziness?: No  Psychologic Depression?: No Anxiety?: No  Physical Exam: BP  119/78 mmHg  Pulse 74  Resp 16  Ht 6\' 1"  (1.854 m)  Wt 280 lb 14.4 oz (127.415 kg)  BMI 37.07 kg/m2  Constitutional:  Alert and oriented, No acute distress. HEENT: [place] AT, moist mucus membranes.  Trachea midline, no masses. Cardiovascular: No clubbing, cyanosis, or edema, RRR Respiratory: Normal respiratory effort, no increased work of breathing, CTAB GI: Abdomen is soft, nontender, nondistended, no abdominal masses GU: left CVA tenderness, circumcised phallus, scrotum and testicles normal DRE: small, smooth prostate, nontender Skin: No rashes, bruises or suspicious lesions. Lymph: No cervical or inguinal adenopathy. Neurologic: Grossly intact, no focal deficits, moving all 4 extremities. Psychiatric: Normal mood and affect.  Laboratory Data: Lab Results  Component Value Date   WBC 13.0* 08/07/2015   HGB 13.9 08/07/2015   HCT 43.0 08/07/2015   MCV 81.4 08/07/2015   PLT 200 08/07/2015    Lab Results  Component Value Date   CREATININE 0.84 08/07/2015    No results found for: PSA  Lab Results  Component Value Date   TESTOSTERONE 113* 08/28/2015    No results found for: HGBA1C  Urinalysis    Component Value Date/Time   COLORURINE YELLOW* 08/28/2015 1128   COLORURINE Yellow 06/04/2013 0554   APPEARANCEUR CLEAR* 08/28/2015 1128   APPEARANCEUR Hazy 06/04/2013 0554   LABSPEC 1.024 08/28/2015 1128   LABSPEC 1.025 06/04/2013 0554   PHURINE 6.0 08/28/2015 1128   PHURINE 5.0 06/04/2013 0554   GLUCOSEU Trace* 09/02/2015 0858   GLUCOSEU Negative 06/04/2013 0554   HGBUR 1+* 08/28/2015 1128   HGBUR 1+ 06/04/2013 0554   BILIRUBINUR Negative 09/02/2015 0858   BILIRUBINUR NEGATIVE 08/28/2015 1128   BILIRUBINUR Negative 06/04/2013 [place] 08/28/2015 1128   KETONESUR Negative 06/04/2013 0554   PROTEINUR NEGATIVE 08/28/2015 1128   PROTEINUR Negative 06/04/2013 0554   NITRITE Positive* 09/02/2015 0858   NITRITE NEGATIVE 08/28/2015 1128   NITRITE Negative  06/04/2013 0554   LEUKOCYTESUR Negative 09/02/2015 0858   LEUKOCYTESUR NEGATIVE 08/28/2015 1128   LEUKOCYTESUR Negative 06/04/2013 0554    Pertinent Imaging: CLINICAL DATA: Acute onset of low back pain radiating down both legs. History of kidney stones. Nausea and vomiting.  EXAM: CT ABDOMEN AND PELVIS WITHOUT CONTRAST  TECHNIQUE: Multidetector CT imaging of the abdomen and pelvis was performed following the standard protocol without IV contrast.  COMPARISON: CT 03/03/2015  FINDINGS: The included lung bases are clear.  The kidneys are symmetric in size without stones or hydronephrosis. There is no perinephric stranding. Both ureters are decompressed without stones along the course. Subcentimeter hypodensity in the posterior lower left kidney, suboptimally defined without contrast.  Clips in the gallbladder fossa postcholecystectomy. No biliary dilatation. No focal hepatic lesion allowing for lack contrast. Unchanged splenomegaly, measuring 13.5 cm in craniocaudal dimension, 15 cm in AP dimension. Pancreas and adrenal glands are normal.  Upper abdominal adenopathy, largest in the portal caval region measuring 1.9 cm, unchanged. Additional peripancreatic lymph node measures 1.6 cm. Small lymph nodes in the lesser sac.  No retroperitoneal adenopathy. Abdominal aorta is normal in caliber. Mild  stranding in the jejunal mesentery is similar to prior exam.  Stomach is distended with ingested contents. There are no dilated or thickened bowel loops. The appendix is normal moderate stool throughout the colon without colonic wall thickening. No free air, free fluid, or intra-abdominal fluid collection.  In the pelvis the bladder is near completely decompressed without stone. Prostate gland is normal. Prominent right external iliac lymph node measures 1.3 cm. Smaller bilateral external iliac and inguinal lymph nodes.  There are no acute or suspicious osseous  abnormalities. 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 There is degenerative change in the spine. Scattered bone islands.  IMPRESSION: 1. No renal stones or obstructive uropathy. No acute abnormality in the abdomen/pelvis. 2. Upper abdominal lymphadenopathy, largest lymph node in the portacaval region measuring 1.9 cm, unchanged. Mild splenomegaly. These findings are unchanged, and may be reactive, however correlation with laboratory values again recommended to exclude lymphoproliferative disorder. Electronically Signed  By: Jeb Levering M.D.  On: 07/25/2015 04:25  Assessment & Plan:   1. Flank pain- Left flank pain x 1.5 months radiating to left scrotum. CT renal stones study performed on 07/25/15 showing no renal stones or obstructive uropathy. No acute abnormalities were seen in the abdomen or pelvis. Patient encouraged to increase daily water intake significantly. - Urinalysis, Complete -Urine Culture  2. Microscopic Hematuria- Patient is allergic to contrast dye. Previous renal stone CT negative. Will  schedule cystoscopy with bilateral retrograde pyelograms for completion of microscopic hematuria workup. Surgical risks reviewed with patient including bleeding, infection, damage to surrounding organs, and anesthesia risks. He states understanding and is willing to proceed with cystoscopy and bilateral retrograde pyelograms.  Surgery will be scheduled with Dr. Pilar Jarvis.   No Follow-up on file.  Herbert Moors, [facility] [address], [place], [place] [address] [phone]

## 2015-09-03 ENCOUNTER — Telehealth: Payer: Self-pay | Admitting: Obstetrics and Gynecology

## 2015-09-03 ENCOUNTER — Other Ambulatory Visit: Payer: Self-pay | Admitting: Radiology

## 2015-09-03 NOTE — Telephone Encounter (Signed)
Patient called into the office after hours nurse line on 09/02/2015 at 6:16pm.    "Caller states that he was seen by the MD today and discussed with the MD that he wanted Percocet instead of Vicodin because he states that it works better.  Stated the MD called in Vicodin instead of the Percocet today.  States he has not taken the Vicodin and has no pain now.  Denies the need for triage today."  The nurse line advised the patient that controlled medications are not called in after hours and based on RN clinical knowledge, normally Percocet has to be on a written prescription to be hand delivered to the pharmacy.  Patient was advised to take the Vicodin at the onset of pain approximately 30-60 minutes after eating to avoid nausea.  He was advised that if the Vicodin does not help the pain, he can call back, but will need to go the the ER.  Advised to call the office in the morning during regular business hours for change in the prescription.

## 2015-09-03 NOTE — Telephone Encounter (Signed)
Pt called to say that his Rx for pain was written for Norco/Vicodin; pt explained that he does not tolerate this med. well and would like a Rx for Percocet to replace it.  He would like a confirmation call in the AM so that he may pick up this Rx.  Please advise. . . sm

## 2015-09-04 ENCOUNTER — Ambulatory Visit
Admission: RE | Admit: 2015-09-04 | Discharge: 2015-09-04 | Disposition: A | Discharge status: Home / Self Care | Payer: 59 | Source: Ambulatory Visit | Attending: Hematology and Oncology | Admitting: Hematology and Oncology

## 2015-09-04 ENCOUNTER — Encounter: Payer: Self-pay | Admitting: Hematology and Oncology

## 2015-09-04 ENCOUNTER — Telehealth: Payer: Self-pay | Admitting: Obstetrics and Gynecology

## 2015-09-04 DIAGNOSIS — C9110 Chronic lymphocytic leukemia of B-cell type not having achieved remission: Secondary | ICD-10-CM

## 2015-09-04 LAB — CBC
HCT: 43 % (ref 40.0–52.0)
Hemoglobin: 13.8 g/dL (ref 13.0–18.0)
MCH: 26 pg (ref 26.0–34.0)
MCHC: 32 g/dL (ref 32.0–36.0)
MCV: 81.4 fL (ref 80.0–100.0)
PLATELETS: 198 10*3/uL (ref 150–440)
RBC: 5.28 MIL/uL (ref 4.40–5.90)
RDW: 14.2 % (ref 11.5–14.5)
WBC: 13.4 10*3/uL — AB (ref 3.8–10.6)

## 2015-09-04 LAB — APTT: APTT: 32 s (ref 24–36)

## 2015-09-04 LAB — DIFFERENTIAL
Basophils Absolute: 0 10*3/uL (ref 0–0.1)
Basophils Relative: 0 %
Eosinophils Absolute: 0.1 10*3/uL (ref 0–0.7)
Eosinophils Relative: 1 %
Lymphocytes Relative: 48 %
Lymphs Abs: 6.6 10*3/uL — ABNORMAL HIGH (ref 1.0–3.6)
Monocytes Absolute: 1 10*3/uL (ref 0.2–1.0)
Monocytes Relative: 7 %
Neutro Abs: 6 10*3/uL (ref 1.4–6.5)
Neutrophils Relative %: 44 %

## 2015-09-04 LAB — PROTIME-INR
INR: 1
Prothrombin Time: 13.4 seconds (ref 11.4–15.0)

## 2015-09-04 MED ORDER — MIDAZOLAM HCL 5 MG/5ML IJ SOLN
INTRAMUSCULAR | Status: AC
  Filled 2015-09-04: qty 5

## 2015-09-04 MED ORDER — OXYCODONE-ACETAMINOPHEN 7.5-325 MG PO TABS: 1.0000 | ORAL_TABLET | ORAL | Status: DC | PRN

## 2015-09-04 MED ORDER — FENTANYL CITRATE (PF) 100 MCG/2ML IJ SOLN
INTRAMUSCULAR | Status: AC | PRN
  Administered 2015-09-04: 25 ug via INTRAVENOUS
  Administered 2015-09-04: 50 ug via INTRAVENOUS
  Administered 2015-09-04: 25 ug via INTRAVENOUS
  Administered 2015-09-04 (×2): 50 ug via INTRAVENOUS

## 2015-09-04 MED ORDER — SODIUM CHLORIDE 0.9 % IV SOLN
INTRAVENOUS | Status: DC
  Administered 2015-09-04: 13:00:00 via INTRAVENOUS

## 2015-09-04 MED ORDER — HEPARIN SOD (PORK) LOCK FLUSH 100 UNIT/ML IV SOLN
INTRAVENOUS | Status: AC
  Filled 2015-09-04: qty 5

## 2015-09-04 MED ORDER — HYDROCODONE-ACETAMINOPHEN 5-325 MG PO TABS: 1.0000 | ORAL_TABLET | ORAL | Status: DC | PRN

## 2015-09-04 MED ORDER — MIDAZOLAM HCL 5 MG/5ML IJ SOLN
INTRAMUSCULAR | Status: AC | PRN
  Administered 2015-09-04 (×4): 1 mg via INTRAVENOUS

## 2015-09-04 MED ORDER — FENTANYL CITRATE (PF) 100 MCG/2ML IJ SOLN
INTRAMUSCULAR | Status: AC
  Filled 2015-09-04: qty 4

## 2015-09-04 NOTE — Telephone Encounter (Signed)
LVM on pt's mobile # for pt to pick up his new Rx (per the pt's request) and requested that he bring the Norco/Vicodin to exchange. . . sm

## 2015-09-04 NOTE — H&P (Signed)
Chief Complaint: Scheduled for bone marrow biopsy  Referring Physician(s): Corcoran,Melissa C  History of Present Illness: Albert Sanders is a 49 y.o. 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  Patient initially presented with left flank and scrotal pain and eventually found to have abnormal blood work suggesting possible CLL.  Patient complains of mild sweating today, no fevers.  Left flank pain yesterday.  Patient has difficulty urinating and being seen by Urology.  Denies chest pain.  Has some breathing problems at night.    Past Medical History  Diagnosis Date  . Kidney stone     Past Surgical History  Procedure Laterality Date  . Cholecystectomy    . Anterior cruciate ligament repair      Allergies: Contrast media  Medications: Prior to Admission medications   Medication Sig Start Date End Date Taking? Authorizing Provider  ibuprofen (ADVIL,MOTRIN) 100 MG tablet Take 100 mg by mouth every 6 (six) hours as needed for fever.   Yes Historical Provider, MD  omeprazole (PRILOSEC OTC) 20 MG tablet Take 20 mg by mouth as needed (for heartburn).   Yes Historical Provider, MD  HYDROcodone-acetaminophen (NORCO) 5-325 MG tablet Take 1 tablet by mouth every 6 (six) hours as needed for moderate pain. 09/02/15   Roda Shutters, FNP  oxyCODONE-acetaminophen (PERCOCET) 7.5-325 MG tablet Take 1 tablet by mouth every 4 (four) hours as needed for severe pain. 09/04/15   Roda Shutters, FNP     History reviewed. No pertinent family history.  Social History   Social History  . Marital Status: Married    Spouse Name: N/A  . Number of Children: N/A  . Years of Education: N/A   Social History Main Topics  . Smoking status: Never Smoker   . Smokeless tobacco: Current User    Types: Snuff  . Alcohol Use: No     Comment: occasionally  . Drug Use: No  . Sexual Activity: Not Asked   Other Topics Concern  . None   Social History Narrative    Review of Systems  Constitutional: Positive for  diaphoresis.  Respiratory:       Occasionally at night  Cardiovascular: Negative.   Gastrointestinal:       Left flank pain.  Genitourinary: Positive for difficulty urinating.    Vital Signs: BP 130/77 mmHg  Ht '6\' 1"'  (1.854 m)  Wt 280 lb (127.007 kg)  BMI 36.95 kg/m2  Physical Exam  Mallampati Score:  MD Evaluation Airway: WNL Heart: WNL Abdomen: WNL Chest/ Lungs: WNL ASA  Classification: 1 Mallampati/Airway Score: One  Imaging: Nm Pet Image Initial (pi) Skull Base To Thigh  08/07/2015   CLINICAL DATA:  Initial treatment strategy for upper abdominal lymphadenopathy and splenomegaly with B symptoms.  EXAM: NUCLEAR MEDICINE PET SKULL BASE TO THIGH  TECHNIQUE: 13.2 mCi F-18 FDG was injected intravenously. Full-ring PET imaging was performed from the skull base to thigh after the radiotracer. CT data was obtained and used for attenuation correction and anatomic localization.  FASTING BLOOD GLUCOSE:  Value: 103 mg/dl  COMPARISON:  07/25/2015 CT abdomen/ pelvis.  FINDINGS: NECK  There is misregistration of the PET and CT images in the head and neck due to patient motion.  No hypermetabolic lymph nodes in the neck.  CHEST  No hypermetabolic axillary, mediastinal or hilar nodes. No significant pulmonary nodules or acute consolidative airspace disease on the CT scan. There is atherosclerosis of the thoracic aorta, the great vessels of the mediastinum and the coronary arteries, including calcified  atherosclerotic plaque in the left main, left anterior descending, left circumflex and right coronary arteries.  ABDOMEN/PELVIS  There is focal hypermetabolism within a 1.0 cm superior right adrenal nodule (series 3/image 150) with max SUV 6.4 and a CT density of 9 HU, which is unchanged in size since 08/05/2011 CT, in keeping with a hypermetabolic benign adrenal adenoma.  No abnormal hypermetabolic activity within the liver, pancreas, left adrenal gland, or spleen. The spleen measures 11.7 cm in  craniocaudal height, which is within normal size limits. No hypermetabolic lymph nodes in the abdomen or pelvis. The mildly enlarged 1.6 cm gastrohepatic ligament (series 3/image 150), 1.9 cm portacaval (3/161) and 1.3 cm right external iliace (3/265) lymph nodes are non hypermetabolic (max SUV less than the adjacent blood pool activity). Status post cholecystectomy. Bile ducts are within expected post cholecystectomy limits. There is stable mild haziness of the central mesentery.  SKELETON  No focal hypermetabolic activity to suggest skeletal metastasis.  IMPRESSION: 1. No hypermetabolic lymphadenopathy or metastatic disease. Top-normal size spleen with no splenic hypermetabolism. Please note that certain indolent lymphomas are non FDG avid, and correlation with serum testing is advised. Consider a follow-up CT abdomen/pelvis in 6-12 months to document stability of the mildly enlarged upper retroperitoneal and pelvic lymph nodes described above. 2. Stable small benign right adrenal adenoma. 3. Atherosclerosis, including left main and 3 vessel coronary artery disease. Please note that although the presence of coronary artery calcium documents the presence of coronary artery disease, the severity of this disease and any potential stenosis cannot be assessed on this non-gated CT examination. Assessment for potential risk factor modification, dietary therapy or pharmacologic therapy may be warranted, if clinically indicated.   Electronically Signed   By: Ilona Sorrel M.D.   On: 08/07/2015 13:57    Labs:  CBC:  Recent Labs  03/03/15 1830 03/03/15 1842 07/25/15 0230 08/07/15 0953 09/04/15 1248  WBC 94.5*  --  DUPLICATE REQUEST  03.8* 13.0* 13.4*  HGB 88.2 80.0 DUPLICATE REQUEST  34.9 13.9 13.8  HCT 17.9 15.0 DUPLICATE REQUEST  56.9 43.0 43.0  PLT 794  --  DUPLICATE REQUEST  [phone]    COAGS:  Recent Labs  08/28/15 1126 09/04/15 1248  INR 0.96 1.00  APTT  --  32    BMP:  Recent  Labs  03/03/15 1842 07/25/15 0230 08/07/15 0953  NA 139 139 135  K 4.2 4.0 4.3  CL 103 102 102  CO2  --  28 29  GLUCOSE 94 114* 121*  BUN 20 20 21*  CALCIUM  --  9.1 8.4*  CREATININE 0.90 1.29* 0.84  GFRNONAA  --  >60 >60  GFRAA  --  >60 >60    LIVER FUNCTION TESTS:  Recent Labs  07/25/15 0230 08/07/15 0953  BILITOT 1.0 0.5  AST 25 27  ALT 25 26  ALKPHOS 86 88  PROT 7.4 7.1  ALBUMIN 4.1 3.8    TUMOR MARKERS: No results for input(s): AFPTM, CEA, CA199, CHROMGRNA in the last 8760 hours.  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  Procedure discussed with patient and understands risks/benefits.  Informed consent obtained.  Plan for CT guided bone marrow biopsy with moderate sedation.    SignedCarylon Perches 09/04/2015, 1:36 PM

## 2015-09-04 NOTE — Telephone Encounter (Signed)
Spoke w/pt; he returned my call from this AM regarding his Rx.  He had already picked up his Rx and had no further questions or concerns. . . sm

## 2015-09-04 NOTE — Telephone Encounter (Signed)
Prescription printed and placed on your desk. Please notify the patient. Thanks

## 2015-09-04 NOTE — Procedures (Signed)
CT guided bone marrow biopsy.  One core and two aspirates.  Minimal blood loss.  No immediate complication.

## 2015-09-04 NOTE — Telephone Encounter (Signed)
Albert Sanders called and asked for Albert Sanders to return his call, [phone]

## 2015-09-06 LAB — CULTURE, URINE COMPREHENSIVE

## 2015-09-07 ENCOUNTER — Telehealth: Payer: Self-pay

## 2015-09-07 ENCOUNTER — Telehealth: Payer: Self-pay | Admitting: Radiology

## 2015-09-07 DIAGNOSIS — N390 Urinary tract infection, site not specified: Secondary | ICD-10-CM

## 2015-09-07 MED ORDER — AMOXICILLIN-POT CLAVULANATE 875-125 MG PO TABS: 1.0000 | ORAL_TABLET | Freq: Two times a day (BID) | ORAL | Status: AC

## 2015-09-07 NOTE — Telephone Encounter (Signed)
Pt notified of surgery scheduled 09/16/15, pre-admit phone interview 09/09/15 between 9am-1pm, and to call day prior to surgery for arrival time to SDS. Pt verbalizes understanding.

## 2015-09-07 NOTE — Telephone Encounter (Signed)
-----   Message from Roda Shutters, [place] sent at 09/07/2015  8:49 AM EDT ----- Please notify patient that his urine did show some bacterial growth. This is typically not clinically significant but since he is having surgery soon I would like to treat it with 875 g Augmentin twice a day 7 days. Please send this into his pharmacy. Thank you

## 2015-09-07 NOTE — Telephone Encounter (Signed)
LMOM-medication called into pharmacy 

## 2015-09-08 ENCOUNTER — Telehealth: Payer: Self-pay

## 2015-09-08 NOTE — Telephone Encounter (Signed)
LMOM- abx at pharmacy

## 2015-09-08 NOTE — Telephone Encounter (Signed)
Pt called requesting more percocet. Nurse made pt aware when he spoke with Richardson Landry last week he was asked to bring Vicodin script/pills back in and we would give him 10 percocet pills. Pt stated that "I took the script to the pharmacy, had it filled, took it home and then realized it was not percocet. I told Richardson Landry that last week." Nurse made pt aware that's ok but we requested the medication be brought back. Pt said "Well im not giving you the pills back". Nurse made pt aware [facility] has a database where all narcotics are tracked and who the provider is. Due to the amount of narcotics he has already received and filled the scripts Ria Comment nor any other provider would give him anymore. Pt stated he was not happy with our practice and wanted to cancel all his appointments and procedures. Nurse made pt aware that would be ok. Pt then voiced his anger very rudely that an abx was called into his pharmacy and he was not notified. Nurse made pt aware that he was called and a message was left on  [phone] number and a little girl was on the machine saying leave a message for my daddy. And then today he was tried to be reached again on the [phone] number and a message was left. Pt started getting very ugly and yelling then hung up.

## 2015-09-08 NOTE — Telephone Encounter (Signed)
-----   Message from Roda Shutters, [place] sent at 09/07/2015  8:49 AM EDT ----- Please notify patient that his urine did show some bacterial growth. This is typically not clinically significant but since he is having surgery soon I would like to treat it with 875 g Augmentin twice a day 7 days. Please send this into his pharmacy. Thank you

## 2015-09-08 NOTE — Telephone Encounter (Signed)
Pt called stating he had picked up the abx however he needs a refill of his oxycodone to last until surgery scheduled 10/19. Please return call at [phone].

## 2015-09-08 NOTE — Telephone Encounter (Signed)
Pt called back stating he picked up medication.

## 2015-09-08 NOTE — Telephone Encounter (Signed)
-----   Message from Roda Shutters, [place] sent at 09/07/2015  8:49 AM EDT ----- Please notify patient that his urine did show some bacterial growth. This is typically not clinically significant but since he is having surgery soon I would like to treat it with 875 g Augmentin twice a day 7 days. Please send this into his pharmacy. Thank you

## 2015-09-09 ENCOUNTER — Telehealth: Payer: Self-pay | Admitting: Obstetrics and Gynecology

## 2015-09-09 ENCOUNTER — Inpatient Hospital Stay: Admission: RE | Admit: 2015-09-09 | Payer: PRIVATE HEALTH INSURANCE | Source: Ambulatory Visit

## 2015-09-09 ENCOUNTER — Other Ambulatory Visit: Payer: Self-pay | Admitting: Obstetrics and Gynecology

## 2015-09-09 ENCOUNTER — Encounter: Payer: Self-pay | Admitting: *Deleted

## 2015-09-09 MED ORDER — TRAMADOL HCL 50 MG PO TABS: 50.0000 mg | ORAL_TABLET | Freq: Four times a day (QID) | ORAL | Status: DC | PRN

## 2015-09-09 NOTE — Patient Instructions (Signed)
  Your procedure is scheduled on: 09-16-15 Report to [facility] To find out your arrival time please call [phone] between 1PM - 3PM on 09-15-15  Remember: Instructions that are not followed completely may result in serious medical risk, up to and including death, or upon the discretion of your surgeon and anesthesiologist your surgery may need to be rescheduled.    _X___ 1. Do not eat food or drink liquids after midnight. No gum chewing or hard candies.     _X___ 2. No Alcohol for 24 hours before or after surgery.   ____ 3. Bring all medications with you on the day of surgery if instructed.    ____ 4. Notify your doctor if there is any change in your medical condition     (cold, fever, infections).     Do not wear jewelry, make-up, hairpins, clips or nail polish.  Do not wear lotions, powders, or perfumes. You may wear deodorant.  Do not shave 48 hours prior to surgery. Men may shave face and neck.  Do not bring valuables to the hospital.    [facility] is not responsible for any belongings or valuables.               Contacts, dentures or bridgework may not be worn into surgery.  Leave your suitcase in the car. After surgery it may be brought to your room.  For patients admitted to the hospital, discharge time is determined by your treatment team.   Patients discharged the day of surgery will not be allowed to drive home.   Please read over the following fact sheets that you were given:      __X__ Take these medicines the morning of surgery with A SIP OF WATER:    1. PRILOSEC  2. TAKE AN EXTRA PRILOSEC TUESDAY  3.   4.  5.  6.  ____ Fleet Enema (as directed)   ____ Use CHG Soap as directed  ____ Use inhalers on the day of surgery  ____ Stop metformin 2 days prior to surgery    ____ Take 1/2 of usual insulin dose the night before surgery and none on the morning of surgery.   ____ Stop Coumadin/Plavix/aspirin-N/A  _X___ Stop  Anti-inflammatories-STOP IBUPROFEN NOW-NO NSAIDS OR ASA PRODUCTS-TRAMADOL OK    ____ Stop supplements until after surgery.    ____ Bring C-Pap to the hospital.

## 2015-09-09 NOTE — Telephone Encounter (Signed)
I spoke with patient and went over him with him in detail regarding his previous CT scan showing no abnormalities along the urinary tract. There there were no stones seen or hydronephrosis. There is no clear source for his pain. He is scheduled for a cystoscopy with bilateral retrogrades because he is allergic to contrast dye and have microscopic hematuria. I reinforced the fact that we do not typically prescribed any narcotic pain medication unless there is a clear urologic source for pain. I encourage patient to be seen by his primary care provider or should the pain become in fact "debilitating" as he describes that he should be seen in the emergency department. He reports that laying down improves his pain. I explained to him that kidney pain is not typically positional and this leads me to believe that his pain might be musculoskeletal in nature. I told him that I would prescribe him tramadol and Lyrica prescription at the front desk. This will be the last pain medication he received for his procedure. I also told him that he can take Motrin as needed as well.

## 2015-09-09 NOTE — Telephone Encounter (Signed)
Patient called this morning with "debilitating back pain that radiates to his scrotum".  Please review the previous notes regarding his requests for pain medication and advise on how to proceed.

## 2015-09-14 ENCOUNTER — Inpatient Hospital Stay: Admission: RE | Admit: 2015-09-14 | Payer: PRIVATE HEALTH INSURANCE | Source: Ambulatory Visit

## 2015-09-16 ENCOUNTER — Ambulatory Visit: Payer: 59 | Admitting: Anesthesiology

## 2015-09-16 ENCOUNTER — Encounter: Payer: Self-pay | Admitting: *Deleted

## 2015-09-16 ENCOUNTER — Encounter
Admission: RE | Disposition: A | Discharge status: Home / Self Care | Payer: Self-pay | Source: Ambulatory Visit | Attending: Urology

## 2015-09-16 ENCOUNTER — Telehealth: Payer: Self-pay

## 2015-09-16 ENCOUNTER — Ambulatory Visit
Admission: RE | Admit: 2015-09-16 | Discharge: 2015-09-16 | Disposition: A | Discharge status: Home / Self Care | Payer: 59 | Source: Ambulatory Visit | Attending: Urology | Admitting: Urology

## 2015-09-16 DIAGNOSIS — R109 Unspecified abdominal pain: Secondary | ICD-10-CM | POA: Diagnosis not present

## 2015-09-16 DIAGNOSIS — R161 Splenomegaly, not elsewhere classified: Secondary | ICD-10-CM | POA: Insufficient documentation

## 2015-09-16 DIAGNOSIS — Z87442 Personal history of urinary calculi: Secondary | ICD-10-CM | POA: Insufficient documentation

## 2015-09-16 DIAGNOSIS — Z91041 Radiographic dye allergy status: Secondary | ICD-10-CM | POA: Insufficient documentation

## 2015-09-16 DIAGNOSIS — Z9049 Acquired absence of other specified parts of digestive tract: Secondary | ICD-10-CM | POA: Diagnosis not present

## 2015-09-16 DIAGNOSIS — Z856 Personal history of leukemia: Secondary | ICD-10-CM | POA: Insufficient documentation

## 2015-09-16 DIAGNOSIS — Z720 Tobacco use: Secondary | ICD-10-CM | POA: Diagnosis not present

## 2015-09-16 DIAGNOSIS — R3121 Asymptomatic microscopic hematuria: Secondary | ICD-10-CM | POA: Diagnosis not present

## 2015-09-16 DIAGNOSIS — Z79899 Other long term (current) drug therapy: Secondary | ICD-10-CM | POA: Diagnosis not present

## 2015-09-16 DIAGNOSIS — R311 Benign essential microscopic hematuria: Secondary | ICD-10-CM | POA: Diagnosis not present

## 2015-09-16 DIAGNOSIS — R3129 Other microscopic hematuria: Secondary | ICD-10-CM | POA: Insufficient documentation

## 2015-09-16 DIAGNOSIS — M545 Low back pain: Secondary | ICD-10-CM | POA: Insufficient documentation

## 2015-09-16 HISTORY — PX: CYSTOSCOPY W/ RETROGRADES: SHX1426

## 2015-09-16 HISTORY — DX: Gastro-esophageal reflux disease without esophagitis: K21.9

## 2015-09-16 HISTORY — DX: Other complications of anesthesia, initial encounter: T88.59XA

## 2015-09-16 HISTORY — DX: Adverse effect of unspecified anesthetic, initial encounter: T41.45XA

## 2015-09-16 SURGERY — CYSTOSCOPY, WITH RETROGRADE PYELOGRAM
Anesthesia: General | Laterality: Bilateral | Wound class: Clean Contaminated

## 2015-09-16 MED ORDER — PROPOFOL 10 MG/ML IV BOLUS
INTRAVENOUS | Status: DC | PRN
  Administered 2015-09-16: 250 mg via INTRAVENOUS

## 2015-09-16 MED ORDER — DEXAMETHASONE SODIUM PHOSPHATE 4 MG/ML IJ SOLN
INTRAMUSCULAR | Status: DC | PRN
  Administered 2015-09-16: 10 mg via INTRAVENOUS

## 2015-09-16 MED ORDER — CEFAZOLIN SODIUM-DEXTROSE 2-3 GM-% IV SOLR
INTRAVENOUS | Status: DC
Start: 2015-09-16 — End: 2015-09-16
  Filled 2015-09-16: qty 50

## 2015-09-16 MED ORDER — CIPROFLOXACIN HCL 500 MG PO TABS: 500.0000 mg | ORAL_TABLET | Freq: Two times a day (BID) | ORAL | Status: DC

## 2015-09-16 MED ORDER — LIDOCAINE HCL (CARDIAC) 20 MG/ML IV SOLN
INTRAVENOUS | Status: DC | PRN
Start: 2015-09-16 — End: 2015-09-16
  Administered 2015-09-16: 100 mg via INTRAVENOUS

## 2015-09-16 MED ORDER — FENTANYL CITRATE (PF) 100 MCG/2ML IJ SOLN
INTRAMUSCULAR | Status: DC | PRN
  Administered 2015-09-16: 100 ug via INTRAVENOUS
  Administered 2015-09-16 (×2): 50 ug via INTRAVENOUS

## 2015-09-16 MED ORDER — ROCURONIUM BROMIDE 100 MG/10ML IV SOLN
INTRAVENOUS | Status: DC | PRN
  Administered 2015-09-16: 10 mg via INTRAVENOUS

## 2015-09-16 MED ORDER — FENTANYL CITRATE (PF) 100 MCG/2ML IJ SOLN
25.0000 ug | INTRAMUSCULAR | Status: DC | PRN
  Administered 2015-09-16 (×4): 25 ug via INTRAVENOUS

## 2015-09-16 MED ORDER — ONDANSETRON HCL 4 MG/2ML IJ SOLN
4.0000 mg | Freq: Once | INTRAMUSCULAR | Status: DC | PRN
Start: 2015-09-16 — End: 2015-09-16

## 2015-09-16 MED ORDER — FENTANYL CITRATE (PF) 100 MCG/2ML IJ SOLN
INTRAMUSCULAR | Status: AC
  Administered 2015-09-16: 25 ug via INTRAVENOUS
  Filled 2015-09-16: qty 2

## 2015-09-16 MED ORDER — HYDROCODONE-ACETAMINOPHEN 5-325 MG PO TABS: 1.0000 | ORAL_TABLET | Freq: Four times a day (QID) | ORAL | Status: DC | PRN

## 2015-09-16 MED ORDER — SUCCINYLCHOLINE CHLORIDE 20 MG/ML IJ SOLN
INTRAMUSCULAR | Status: DC | PRN
  Administered 2015-09-16: 140 mg via INTRAVENOUS

## 2015-09-16 MED ORDER — CEFAZOLIN SODIUM-DEXTROSE 2-3 GM-% IV SOLR: 2.0000 g | Freq: Once | INTRAVENOUS | Status: DC

## 2015-09-16 MED ORDER — LACTATED RINGERS IV SOLN
INTRAVENOUS | Status: DC
  Administered 2015-09-16: 11:00:00 via INTRAVENOUS

## 2015-09-16 MED ORDER — IOTHALAMATE MEGLUMINE 43 % IV SOLN
INTRAVENOUS | Status: DC | PRN
Start: 2015-09-16 — End: 2015-09-16
  Administered 2015-09-16: 15 mL

## 2015-09-16 MED ORDER — MIDAZOLAM HCL 2 MG/2ML IJ SOLN
INTRAMUSCULAR | Status: DC | PRN
  Administered 2015-09-16: 2 mg via INTRAVENOUS

## 2015-09-16 MED ORDER — ONDANSETRON HCL 4 MG/2ML IJ SOLN
INTRAMUSCULAR | Status: DC | PRN
  Administered 2015-09-16: 4 mg via INTRAVENOUS

## 2015-09-16 SURGICAL SUPPLY — 14 items
BACTOSHIELD CHG 4% 4OZ (MISCELLANEOUS) ×2
CATH URETL 5X70 OPEN END (CATHETERS) ×3 IMPLANT
CONRAY 43 FOR UROLOGY 50M (MISCELLANEOUS) ×3 IMPLANT
GLOVE BIO SURGEON STRL SZ7 (GLOVE) ×6 IMPLANT
GLOVE BIO SURGEON STRL SZ7.5 (GLOVE) ×3 IMPLANT
GOWN STRL REUS W/ TWL LRG LVL3 (GOWN DISPOSABLE) ×1 IMPLANT
GOWN STRL REUS W/TWL LRG LVL3 (GOWN DISPOSABLE) ×3
GOWN STRL REUS W/TWL XL LVL3 (GOWN DISPOSABLE) ×3 IMPLANT
KIT RM TURNOVER CYSTO AR (KITS) ×3 IMPLANT
PACK CYSTO AR (MISCELLANEOUS) ×3 IMPLANT
SCRUB CHG 4% DYNA-HEX 4OZ (MISCELLANEOUS) ×1 IMPLANT
SENSORWIRE 0.038 NOT ANGLED (WIRE) ×3
SURGILUBE 2OZ TUBE FLIPTOP (MISCELLANEOUS) ×3 IMPLANT
WIRE SENSOR 0.038 NOT ANGLED (WIRE) ×1 IMPLANT

## 2015-09-16 NOTE — Transfer of Care (Signed)
Immediate Anesthesia Transfer of Care Note  Patient: Albert Sanders  Procedure(s) Performed: Procedure(s): CYSTOSCOPY WITH RETROGRADE PYELOGRAM (Bilateral)  Patient Location: PACU  Anesthesia Type:General  Level of Consciousness: awake, alert  and oriented  Airway & Oxygen Therapy: Patient Spontanous Breathing and Patient connected to nasal cannula oxygen  Post-op Assessment: Report given to RN and Post -op Vital signs reviewed and stable  Post vital signs: Reviewed and stable  Last Vitals:  Filed Vitals:   09/16/15 1031  BP: 123/75  Pulse: 82  Temp: 36.9 C  Resp: 16    Complications: No apparent anesthesia complications

## 2015-09-16 NOTE — Telephone Encounter (Signed)
-----   Message from Nickie Retort, MD sent at 09/16/2015  1:31 PM EDT ----- Pt. Needs to f/u in one year for repeat u/a. Dx: microhematuria

## 2015-09-16 NOTE — Op Note (Signed)
Preoperative diagnosis: Asymptomatic Microscopic Hematuria  Postoperative diagnosis: Same  Procedure: Cystoscopy, bilateral retrograde pyelogram with interpretation  Surgeon: Baruch Gouty, M.D.  Findings: Normal cystoscopy with normal bilateral retrograde pyelogram  Disposition: Stable to the postanesthesia care unit  Indications for procedure: The patient is a 49 year old gentleman who is noted to have microscopic hematuria. He had a CT without contrast. He however cannot tolerate intravenous contrast who presents today for retrograde pyelogram in the OR with cystoscopy.  Description of procedure: The patient was met in the preoperative area. All risks, benefits, indications of the procedure were described in great detail. The patient consented the procedure. He was given preoperative buttocks. He was taken to the operative theater. Gen. anesthesia induced per the anesthesia service. The patient was placed in dorsal lithotomy position and prepped and draped in usual sterile fashion. A timeout was called. A 21 French 30 cystoscope was inserted in the patient's bladder per urethra atraumatically. Pan cystoscopy was then performed. It was unremarkable. The bilateral ureteral orifices were seen with clear reflux. Bilateral retrograde powder was obtained with an open a catheter. These were unremarkable. There is no masses, filling defects, or other abnormalities. There is no hydronephrosis. This point the patient's bladder was empty. He was woken from anesthesia and transferred stable condition to postanesthesia care unit. Next  Plan: The patient will follow-up in one year for urinalysis check for Marcus Hematuria. His hematuria workup at this time is negative.

## 2015-09-16 NOTE — H&P (View-Only) (Signed)
09/02/2015 3:14 PM   Albert Sanders 20-May-1966 [id number]  Referring provider: Guadalupe Maple, MD [address] [place], [place] [address]  Chief Complaint  Patient presents with  . Flank Pain  . Establish Care    HPI:  Patient is a 49 year old male with a history of leukemia presenting today as a referral for left flank pain occurring intermittently for the last 1.5 months. He describes pain as dull and achy occurring every few days. Pain radiates down to left scrotum. Laying down and resting helps his pain. Standing up for long periods of times worsens pain. He has taken ibuprofen for the pain which he states did not lessen his discomfort. He reports pain being so severe at times that it is debilitating.   It is of note he also had microscopic hematuria (0-5) on 2 urinalysis within the last month. He does report noticing a slightly weaker stream over the last 2 months. He denies dysuria, gross hematuria, urgency, frequency or sensation of incomplete bladder emptying. No previous history of GU surgeries or infections. He was never a smoker.  Patient does report a history of kidney stones. Last episode greater than 1 year ago. He has not previously seen a urologist for stone episodes and states he has passed them all spontaneously.  H2O intake 2 glasses. Mt Dew 1 large glass daily.  CT renal stones study performed on 07/25/15 showing no renal stones or obstructive uropathy. No acute abnormalities were seen in the abdomen or pelvis.  9/9/6 Cr 0.84  Patient has allergy to contrast dye. Reaction history tongue swelling.  PMH: Past Medical History  Diagnosis Date  . Kidney stone     Surgical History: Past Surgical History  Procedure Laterality Date  . Cholecystectomy    . Anterior cruciate ligament repair      Home Medications:    Medication List       This list is accurate as of: 09/02/15 11:59 PM.  Always use your most recent med list.               HYDROcodone-acetaminophen 5-325 MG tablet  Commonly known as:  NORCO  Take 1 tablet by mouth every 6 (six) hours as needed for moderate pain.     ibuprofen 100 MG tablet  Commonly known as:  ADVIL,MOTRIN  Take 100 mg by mouth every 6 (six) hours as needed for fever.     omeprazole 20 MG tablet  Commonly known as:  PRILOSEC OTC  Take 20 mg by mouth as needed (for heartburn).        Allergies:  Allergies  Allergen Reactions  . Contrast Media [Iodinated Diagnostic Agents] Anaphylaxis    Family History: No family history on file.  Social History:  reports that he has never smoked. His smokeless tobacco use includes Snuff. He reports that he drinks alcohol. He reports that he does not use illicit drugs.  ROS: UROLOGY Frequent Urination?: No Hard to postpone urination?: No Burning/pain with urination?: No Get up at night to urinate?: Yes Leakage of urine?: No Urine stream starts and stops?: Yes Trouble starting stream?: Yes Do you have to strain to urinate?: Yes Blood in urine?: No Urinary tract infection?: No Sexually transmitted disease?: No Injury to kidneys or bladder?: No Painful intercourse?: No Weak stream?: Yes Erection problems?: Yes Penile pain?: No  Gastrointestinal Nausea?: No Vomiting?: No Indigestion/heartburn?: Yes Diarrhea?: No Constipation?: No  Constitutional Fever: Yes Night sweats?: Yes Weight loss?: No Fatigue?: Yes  Skin Skin  rash/lesions?: No Itching?: No  Eyes Blurred vision?: No Double vision?: No  Ears/Nose/Throat Sore throat?: No Sinus problems?: No  Hematologic/Lymphatic Swollen glands?: No Easy bruising?: No  Cardiovascular Leg swelling?: Yes Chest pain?: No  Respiratory Cough?: No Shortness of breath?: Yes  Endocrine Excessive thirst?: Yes  Musculoskeletal Back pain?: Yes Joint pain?: Yes  Neurological Headaches?: No Dizziness?: No  Psychologic Depression?: No Anxiety?: No  Physical Exam: BP  119/78 mmHg  Pulse 74  Resp 16  Ht 6\' 1"  (1.854 m)  Wt 280 lb 14.4 oz (127.415 kg)  BMI 37.07 kg/m2  Constitutional:  Alert and oriented, No acute distress. HEENT: [place] AT, moist mucus membranes.  Trachea midline, no masses. Cardiovascular: No clubbing, cyanosis, or edema, RRR Respiratory: Normal respiratory effort, no increased work of breathing, CTAB GI: Abdomen is soft, nontender, nondistended, no abdominal masses GU: left CVA tenderness, circumcised phallus, scrotum and testicles normal DRE: small, smooth prostate, nontender Skin: No rashes, bruises or suspicious lesions. Lymph: No cervical or inguinal adenopathy. Neurologic: Grossly intact, no focal deficits, moving all 4 extremities. Psychiatric: Normal mood and affect.  Laboratory Data: Lab Results  Component Value Date   WBC 13.0* 08/07/2015   HGB 13.9 08/07/2015   HCT 43.0 08/07/2015   MCV 81.4 08/07/2015   PLT 200 08/07/2015    Lab Results  Component Value Date   CREATININE 0.84 08/07/2015    No results found for: PSA  Lab Results  Component Value Date   TESTOSTERONE 113* 08/28/2015    No results found for: HGBA1C  Urinalysis    Component Value Date/Time   COLORURINE YELLOW* 08/28/2015 1128   COLORURINE Yellow 06/04/2013 0554   APPEARANCEUR CLEAR* 08/28/2015 1128   APPEARANCEUR Hazy 06/04/2013 0554   LABSPEC 1.024 08/28/2015 1128   LABSPEC 1.025 06/04/2013 0554   PHURINE 6.0 08/28/2015 1128   PHURINE 5.0 06/04/2013 0554   GLUCOSEU Trace* 09/02/2015 0858   GLUCOSEU Negative 06/04/2013 0554   HGBUR 1+* 08/28/2015 1128   HGBUR 1+ 06/04/2013 0554   BILIRUBINUR Negative 09/02/2015 0858   BILIRUBINUR NEGATIVE 08/28/2015 1128   BILIRUBINUR Negative 06/04/2013 [place] 08/28/2015 1128   KETONESUR Negative 06/04/2013 0554   PROTEINUR NEGATIVE 08/28/2015 1128   PROTEINUR Negative 06/04/2013 0554   NITRITE Positive* 09/02/2015 0858   NITRITE NEGATIVE 08/28/2015 1128   NITRITE Negative  06/04/2013 0554   LEUKOCYTESUR Negative 09/02/2015 0858   LEUKOCYTESUR NEGATIVE 08/28/2015 1128   LEUKOCYTESUR Negative 06/04/2013 0554    Pertinent Imaging: CLINICAL DATA: Acute onset of low back pain radiating down both legs. History of kidney stones. Nausea and vomiting.  EXAM: CT ABDOMEN AND PELVIS WITHOUT CONTRAST  TECHNIQUE: Multidetector CT imaging of the abdomen and pelvis was performed following the standard protocol without IV contrast.  COMPARISON: CT 03/03/2015  FINDINGS: The included lung bases are clear.  The kidneys are symmetric in size without stones or hydronephrosis. There is no perinephric stranding. Both ureters are decompressed without stones along the course. Subcentimeter hypodensity in the posterior lower left kidney, suboptimally defined without contrast.  Clips in the gallbladder fossa postcholecystectomy. No biliary dilatation. No focal hepatic lesion allowing for lack contrast. Unchanged splenomegaly, measuring 13.5 cm in craniocaudal dimension, 15 cm in AP dimension. Pancreas and adrenal glands are normal.  Upper abdominal adenopathy, largest in the portal caval region measuring 1.9 cm, unchanged. Additional peripancreatic lymph node measures 1.6 cm. Small lymph nodes in the lesser sac.  No retroperitoneal adenopathy. Abdominal aorta is normal in caliber. Mild  stranding in the jejunal mesentery is similar to prior exam.  Stomach is distended with ingested contents. There are no dilated or thickened bowel loops. The appendix is normal moderate stool throughout the colon without colonic wall thickening. No free air, free fluid, or intra-abdominal fluid collection.  In the pelvis the bladder is near completely decompressed without stone. Prostate gland is normal. Prominent right external iliac lymph node measures 1.3 cm. Smaller bilateral external iliac and inguinal lymph nodes.  There are no acute or suspicious osseous  abnormalities. 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 There is degenerative change in the spine. Scattered bone islands.  IMPRESSION: 1. No renal stones or obstructive uropathy. No acute abnormality in the abdomen/pelvis. 2. Upper abdominal lymphadenopathy, largest lymph node in the portacaval region measuring 1.9 cm, unchanged. Mild splenomegaly. These findings are unchanged, and may be reactive, however correlation with laboratory values again recommended to exclude lymphoproliferative disorder. Electronically Signed  By: Jeb Levering M.D.  On: 07/25/2015 04:25  Assessment & Plan:   1. Flank pain- Left flank pain x 1.5 months radiating to left scrotum. CT renal stones study performed on 07/25/15 showing no renal stones or obstructive uropathy. No acute abnormalities were seen in the abdomen or pelvis. Patient encouraged to increase daily water intake significantly. - Urinalysis, Complete -Urine Culture  2. Microscopic Hematuria- Patient is allergic to contrast dye. Previous renal stone CT negative. Will  schedule cystoscopy with bilateral retrograde pyelograms for completion of microscopic hematuria workup. Surgical risks reviewed with patient including bleeding, infection, damage to surrounding organs, and anesthesia risks. He states understanding and is willing to proceed with cystoscopy and bilateral retrograde pyelograms.  Surgery will be scheduled with Dr. Pilar Jarvis.   No Follow-up on file.  Herbert Moors, [facility] [address] [place], [place] [address] [phone]

## 2015-09-16 NOTE — Anesthesia Preprocedure Evaluation (Addendum)
Anesthesia Evaluation  Patient identified by MRN, date of birth, ID band Patient awake  General Assessment Comment:Woke up during gallbladder surgery according to patient  Reviewed: Allergy & Precautions, NPO status , Patient's Chart, lab work & pertinent test results  History of Anesthesia Complications (+) AWARENESS UNDER ANESTHESIA and history of anesthetic complications  Airway Mallampati: I  TM Distance: >3 FB     Dental  (+) Chipped   Pulmonary neg pulmonary ROS,    Pulmonary exam normal        Cardiovascular negative cardio ROS Normal cardiovascular exam     Neuro/Psych negative neurological ROS  negative psych ROS   GI/Hepatic GERD  Medicated and Controlled,  Endo/Other    Renal/GU Kidney stone     Musculoskeletal negative musculoskeletal ROS (+)   Abdominal Normal abdominal exam  (+) + obese,   Peds negative pediatric ROS (+)  Hematology Hx of CLL   Anesthesia Other Findings Hx of CLL  Reproductive/Obstetrics                           Anesthesia Physical Anesthesia Plan  ASA: III  Anesthesia Plan: General   Post-op Pain Management:    Induction: Intravenous and Rapid sequence  Airway Management Planned:   Additional Equipment:   Intra-op Plan:   Post-operative Plan: Extubation in OR  Informed Consent: I have reviewed the patients History and Physical, chart, labs and discussed the procedure including the risks, benefits and alternatives for the proposed anesthesia with the patient or authorized representative who has indicated his/her understanding and acceptance.   Dental advisory given  Plan Discussed with: CRNA and Surgeon  Anesthesia Plan Comments:         Anesthesia Quick Evaluation

## 2015-09-16 NOTE — Anesthesia Postprocedure Evaluation (Signed)
  Anesthesia Post-op Note  Patient: Albert Sanders  Procedure(s) Performed: Procedure(s): CYSTOSCOPY WITH RETROGRADE PYELOGRAM (Bilateral)  Anesthesia type:General  Patient location: PACU  Post pain: Pain level controlled  Post assessment: Post-op Vital signs reviewed, Patient's Cardiovascular Status Stable, Respiratory Function Stable, Patent Airway and No signs of Nausea or vomiting  Post vital signs: Reviewed and stable  Last Vitals:  Filed Vitals:   09/16/15 1524  BP: 140/75  Pulse: 67  Temp:   Resp: 16    Level of consciousness: awake, alert  and patient cooperative  Complications: No apparent anesthesia complications

## 2015-09-16 NOTE — Discharge Instructions (Signed)
Cystoscopy, Care After °Refer to this sheet in the next few weeks. These instructions provide you with information on caring for yourself after your procedure. Your caregiver may also give you more specific instructions. Your treatment has been planned according to current medical practices, but problems sometimes occur. Call your caregiver if you have any problems or questions after your procedure. °HOME CARE INSTRUCTIONS  °Things you can do to ease any discomfort after your procedure include: °· Drinking enough water and fluids to keep your urine clear or pale yellow. °· Taking a warm bath to relieve any burning feelings. °SEEK IMMEDIATE MEDICAL CARE IF:  °· You have an increase in blood in your urine. °· You notice blood clots in your urine. °· You have difficulty passing urine. °· You have the chills. °· You have abdominal pain. °· You have a fever or persistent symptoms for more than 2-3 days. °· You have a fever and your symptoms suddenly get worse. °MAKE SURE YOU:  °· Understand these instructions. °· Will watch your condition. °· Will get help right away if you are not doing well or get worse. °  °This information is not intended to replace advice given to you by your health care provider. Make sure you discuss any questions you have with your health care provider. °  °Document Released: 06/03/2005 Document Revised: 12/05/2014 Document Reviewed: 05/07/2012 °Elsevier Interactive Patient Education ©2016 [employer] °General Anesthesia, Adult, Care After °Refer to this sheet in the next few weeks. These instructions provide you with information on caring for yourself after your procedure. Your health care provider may also give you more specific instructions. Your treatment has been planned according to current medical practices, but problems sometimes occur. Call your health care provider if you have any problems or questions after your procedure. °WHAT TO EXPECT AFTER THE PROCEDURE °After the procedure, it  is typical to experience: °· Sleepiness. °· Nausea and vomiting. °HOME CARE INSTRUCTIONS °· For the first 24 hours after general anesthesia: °¨ Have a responsible person with you. °¨ Do not drive a car. If you are alone, do not take public transportation. °¨ Do not drink alcohol. °¨ Do not take medicine that has not been prescribed by your health care provider. °¨ Do not sign important papers or make important decisions. °¨ You may resume a normal diet and activities as directed by your health care provider. °· Change bandages (dressings) as directed. °· If you have questions or problems that seem related to general anesthesia, call the hospital and ask for the anesthetist or anesthesiologist on call. °SEEK MEDICAL CARE IF: °· You have nausea and vomiting that continue the day after anesthesia. °· You develop a rash. °SEEK IMMEDIATE MEDICAL CARE IF:  °· You have difficulty breathing. °· You have chest pain. °· You have any allergic problems. °  °This information is not intended to replace advice given to you by your health care provider. Make sure you discuss any questions you have with your health care provider. °  °Document Released: 02/20/2001 Document Revised: 12/05/2014 Document Reviewed: 03/14/2012 °Elsevier Interactive Patient Education ©2016 [employer] ° °

## 2015-09-16 NOTE — Interval H&P Note (Signed)
History and Physical Interval Note:  09/16/2015 10:44 AM  Albert Sanders  has presented today for surgery, with the diagnosis of MICROSCOPIC HEMATURIA,LEFT FLANK PAIN  The various methods of treatment have been discussed with the patient and family. After consideration of risks, benefits and other options for treatment, the patient has consented to  Procedure(s): CYSTOSCOPY WITH RETROGRADE PYELOGRAM (Bilateral) as a surgical intervention .  The patient's history has been reviewed, patient examined, no change in status, stable for surgery.  I have reviewed the patient's chart and labs.  Questions were answered to the patient's satisfaction.    RRR Unlabored respirations  Albert Sanders

## 2015-09-18 ENCOUNTER — Inpatient Hospital Stay: Payer: 59 | Attending: Hematology and Oncology | Admitting: Hematology and Oncology

## 2015-09-18 ENCOUNTER — Encounter: Payer: Self-pay | Admitting: Hematology and Oncology

## 2015-09-18 ENCOUNTER — Ambulatory Visit: Payer: PRIVATE HEALTH INSURANCE | Admitting: Hematology and Oncology

## 2015-09-18 VITALS — BP 124/85 | HR 75 | Temp 95.0°F | Resp 18 | Ht 73.0 in | Wt 300.7 lb

## 2015-09-18 DIAGNOSIS — C9110 Chronic lymphocytic leukemia of B-cell type not having achieved remission: Secondary | ICD-10-CM | POA: Diagnosis not present

## 2015-09-18 DIAGNOSIS — Z87442 Personal history of urinary calculi: Secondary | ICD-10-CM | POA: Insufficient documentation

## 2015-09-18 DIAGNOSIS — K219 Gastro-esophageal reflux disease without esophagitis: Secondary | ICD-10-CM | POA: Diagnosis not present

## 2015-09-18 DIAGNOSIS — E349 Endocrine disorder, unspecified: Secondary | ICD-10-CM | POA: Insufficient documentation

## 2015-09-18 DIAGNOSIS — R109 Unspecified abdominal pain: Secondary | ICD-10-CM | POA: Insufficient documentation

## 2015-09-18 DIAGNOSIS — R7989 Other specified abnormal findings of blood chemistry: Secondary | ICD-10-CM

## 2015-09-18 DIAGNOSIS — Z720 Tobacco use: Secondary | ICD-10-CM | POA: Diagnosis not present

## 2015-09-18 DIAGNOSIS — R161 Splenomegaly, not elsewhere classified: Secondary | ICD-10-CM | POA: Diagnosis not present

## 2015-09-18 DIAGNOSIS — N209 Urinary calculus, unspecified: Secondary | ICD-10-CM | POA: Insufficient documentation

## 2015-09-18 DIAGNOSIS — R635 Abnormal weight gain: Secondary | ICD-10-CM

## 2015-09-18 DIAGNOSIS — Z79899 Other long term (current) drug therapy: Secondary | ICD-10-CM | POA: Diagnosis not present

## 2015-09-18 DIAGNOSIS — S42009A Fracture of unspecified part of unspecified clavicle, initial encounter for closed fracture: Secondary | ICD-10-CM | POA: Insufficient documentation

## 2015-09-18 NOTE — Progress Notes (Signed)
[facility] day:  09/18/2015  Chief Complaint: Albert Sanders is a 49 y.o. male with a circulating CD5 positive/CD23 positive clonal B-cell population with a CLL/SLL phenotype chronic lymphocytic leukemia and left flank pain who is seen for review of interval bone marrow and following evaluation by urology.  HPI: The patient was last seen in the medical oncology clinic on 08/28/2015.  At that time, peripheral blood flow cytometry was reviewed.  Pathology revealed a CD5 positive/CD23 positive clonal B-cell population with a CLL/SLL phenotype in 27% of leukocytes, which were CD38 negative.  There was a gamma-delta T-cell lymphocytosis representing 2.2% of lymphocytes.  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  We discussed a diagnosis of CLL, not requring treatment.  Patient opted for a bone marrow.  Bone marrow on 09/04/2015 revealed <10% involvement with B-cell lymphoproliferative neoplasm with a B-cell CLL/SLL immunophenotype.  There was mild non-specific dyserythropoiesis and mild increase in reticulin.  Storage iron was present. Flow cytometry revealed CD5+ monotypic (clonal) B cell population (20% of sample) with B-cell CLL/SLL immunophenotype.  Cytogenetics were normal (46, XY).  FISH studies are pending.  Regarding his back and left side pain, he was seen by urology.  He underwent cystoscopy with retrograde pyelogram on 09/16/2015.  There were no abnormalities.  Testosterone level was 113 [phone]) on 08/28/2015.    Symptomatically, he notes back discomfort which radiates to his buttock and leg.  He notes chronic back issues.  He denies any lifting or trauma.  He denies any lower extremity numbness or weakness.  He has gained 20 pounds.  He is eating a lot  at night (pasta, cookies, milk).  Past Medical History  Diagnosis Date  . Kidney stone   . GERD (gastroesophageal reflux disease)   . Cancer ([place])     leukemia  . Complication of anesthesia     woke up during gallbladder surgery-pt states it takes alot of anesthesia to sedate him     Past Surgical History  Procedure Laterality Date  . Cholecystectomy    . Anterior cruciate ligament repair    . Bone marrow biopsy  08-2015  . Biceps tendon repair Right   . Cystoscopy w/ retrogrades Bilateral 09/16/2015    Procedure: CYSTOSCOPY WITH RETROGRADE PYELOGRAM;  Surgeon: Nickie Retort, MD;  Location: [facility] ORS;  Service: Urology;  Laterality: Bilateral;    No family history on file.  Social History:  reports that he has never smoked. His smokeless tobacco use includes Snuff. He reports that he drinks alcohol. He reports that he does not use illicit drugs.  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 He dips 1 can a day. He rarely drinks alcohol. 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   Allergies:  Allergies  Allergen Reactions  . Contrast Media [Iodinated Diagnostic Agents] Anaphylaxis    Current Medications: Current Outpatient Prescriptions  Medication Sig Dispense Refill  . ciprofloxacin (CIPRO) 500 MG tablet Take 1 tablet (500 mg total) by mouth 2 (two) times daily. 4 tablet 0  . HYDROcodone-acetaminophen (NORCO) 5-325 MG tablet Take 1 tablet by mouth every 6 (six) hours as needed for moderate pain. 30 tablet 0  . ibuprofen (ADVIL,MOTRIN) 100 MG tablet Take 100 mg by mouth every 6 (six) hours as  needed for fever.    Marland Kitchen omeprazole (PRILOSEC OTC) 20 MG tablet Take 20 mg by mouth every morning.     . traMADol (ULTRAM) 50 MG tablet Take 1 tablet (50 mg total) by mouth every 6 (six) hours as needed for severe pain. 30 tablet 0   No current facility-administered medications for this visit.    Review of Systems:  GENERAL:  General mild fatigue.  No fever.  Baseline "prolific  sweater".  Weight gain of 20 pound since 08/28/2015. PERFORMANCE STATUS (ECOG):  0 HEENT:  No runny nose, mouth sores or tenderness. Lungs: No shortness of breath or cough.  No hemoptysis. Cardiac:  Chest pain with exertion from "time to time".  No palpitations, orthopnea, or PND. GI:  No nausea, vomiting, diarrhea, melena or hematochezia. GU:  Low testosterone.  No urgency, frequency, dysuria, or hematuria. Musculoskeletal:  Chronic back issues.  No joint pain.  No muscle tenderness. Extremities:  No pain or swelling. Skin:  No rashes or skin changes. Neuro:  No headache, numbness or weakness, balance or coordination issues. Endocrine:  No diabetes, thyroid issues, hot flashes or night sweats. Psych:  No mood changes, depression or anxiety. Pain:  No focal pain. Review of systems:  All other systems reviewed and found to be negative.  Physical Exam: Blood pressure 124/85, pulse 75, temperature 95 F (35 C), temperature source Tympanic, resp. rate 18, height '6\' 1"'  (1.854 m), weight 300 lb 11.3 oz (136.4 kg). GENERAL:  Well developed, well nourished, heavy set gentleman sitting comfortably in the exam room in no acute distress. MENTAL STATUS:  Alert and oriented to person, place and time. HEAD:  Short blonde crew cut hair.  Normocephalic, atraumatic, face symmetric, no Cushingoid features. EYES:  Blue eyes.  Pupils equal round and reactive to light and accomodation.  No conjunctivitis or scleral icterus. ENT:  Oropharynx clear without lesion.  Tongue normal. Mucous membranes moist.  RESPIRATORY:  Clear to auscultation without rales, wheezes or rhonchi. CARDIOVASCULAR:  Regular rate and rhythm without murmur, rub or gallop. ABDOMEN:  Fully round.  Soft, non-tender, with active bowel sounds, and no hepatomegaly.  Spleen tip palpable.  No masses. BACK:  Bone marrow site unremarkable. SKIN:  No rashes, ulcers or lesions. EXTREMITIES: No edema, no skin discoloration or tenderness.  No palpable  cords. LYMPH NODES: No palpable cervical, supraclavicular, axillary or inguinal adenopathy  NEUROLOGICAL: Unremarkable. PSYCH:  Appropriate.   No visits with results within 3 Day(s) from this visit. Latest known visit with results is:  Hospital Outpatient Visit on 09/04/2015  Component Date Value Ref Range Status  . WBC 09/04/2015 13.4* 3.8 - 10.6 K/uL Final  . RBC 09/04/2015 5.28  4.40 - 5.90 MIL/uL Final  . Hemoglobin 09/04/2015 13.8  13.0 - 18.0 g/dL Final  . HCT 09/04/2015 43.0  40.0 - 52.0 % Final  . MCV 09/04/2015 81.4  80.0 - 100.0 fL Final  . MCH 09/04/2015 26.0  26.0 - 34.0 pg Final  . MCHC 09/04/2015 32.0  32.0 - 36.0 g/dL Final  . RDW 09/04/2015 14.2  11.5 - 14.5 % Final  . Platelets 09/04/2015 198  150 - 440 K/uL Final  . Prothrombin Time 09/04/2015 13.4  11.4 - 15.0 seconds Final  . INR 09/04/2015 1.00   Final  . aPTT 09/04/2015 32  24 - 36 seconds Final  . Neutrophils Relative % 09/04/2015 44   Final  . Lymphocytes Relative 09/04/2015 48   Final  . Monocytes Relative 09/04/2015 7  Final  . Eosinophils Relative 09/04/2015 1   Final  . Basophils Relative 09/04/2015 0   Final  . Neutro Abs 09/04/2015 6.0  1.4 - 6.5 K/uL Corrected  . Lymphs Abs 09/04/2015 6.6* 1.0 - 3.6 K/uL Corrected  . Monocytes Absolute 09/04/2015 1.0  0.2 - 1.0 K/uL Corrected  . Eosinophils Absolute 09/04/2015 0.1  0 - 0.7 K/uL Corrected  . Basophils Absolute 09/04/2015 0.0  0 - 0.1 K/uL Corrected    Assessment:  ANDRIC KERCE is a 49 y.o. 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  He was noted to have abdominal adenopathy and mild splenomegaly on CT scan in 02/2015 following a fall.  He represented with abdominal pain, fever, and sweats.  Abdominal and pelvic CT scan without contrast on 07/25/2015 revealed abdominal adenopathy with the largest lymph node in the portacaval region measuring 1.9 cm (stable).  There was mild splenomegaly (15 cm).    Flow cytometry on 08/20/2015 revealed a  CD5 positive/CD23 positive clonal B-cell population with a CLL/SLL phenotype in 27% of leukocytes, which were CD38 negative.  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  Labs on 08/07/2015 revealed a hematocrit of 43, hemoglobin 13.9, platelets 200,000, white count 13,000 with an ANC of 500. Differential included 50% segs and 42% lymphs with a mild lymphocytosis (5500).  Comprehensive metabolic, LDH, uric acid, sedimentation rate, SPEP, and immunoglobulins were normal.  PET scan on 08/07/2015 revealed no hypermetabolic lymphadenopathy or metastatic disease. There was a mildly enlarged gastrohepatic ligament, portacaval, and right external iliac lymph nodes (1.3-1.9 cm).  He had a top normal spleen size.   Bone marrow on 09/04/2015 revealed <10% involvement with B-cell lymphoproliferative neoplasm with a B-cell CLL/SLL immunophenotype.  There was mild non-specific dyserythropoiesis and mild increase in reticulin.  Storage iron was present. Flow cytometry revealed CD5+ monotypic (clonal) B cell population (20% of sample) with B-cell CLL/SLL immunophenotype.  Cytogenetics were normal (46, XY).  FISH studies are pending.  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  Cystoscopy with retrograde pyelogram on 09/16/2015 revealed no abnormalities.  Testosterone level was 113 [phone]) on 08/28/2015.    Symptomatically, he has chronic mild fatigued.  He has no B symptoms.  He is gaining a significant amount of weight (20 pounds in 3 weeks).  He has a low testosterone level.  Plan: 1.  Review bone marrow and cystocopy. 2.  Discuss plan for observation regarding CLL. 3.  Follow-up pending FISH studies. 4.  Discuss concern regarding diet and excess weight gain. 5.  Follow-up with PCP regarding low testosterone  and weight. 6.  RTC in 3 months for MD assessment and labs (CBC with diff, BMP, LDH, uric acid).   Lequita Asal, MD  09/18/2015, 9:14 AM

## 2015-09-18 NOTE — Progress Notes (Signed)
Patient is here for follow-up of CLL and bx results. Patient states that he had cystoscopy on Oct. 19th. He was told that everything looked ok and to follow-up with them in one year. He states that his appetite has been very good. He states that he has probably been eating way to much.

## 2015-09-21 ENCOUNTER — Ambulatory Visit (INDEPENDENT_AMBULATORY_CARE_PROVIDER_SITE_OTHER): Payer: PRIVATE HEALTH INSURANCE | Admitting: Family Medicine

## 2015-09-21 ENCOUNTER — Encounter: Payer: Self-pay | Admitting: Family Medicine

## 2015-09-21 VITALS — BP 121/80 | HR 81 | Temp 97.2°F | Ht 71.25 in | Wt 290.0 lb

## 2015-09-21 DIAGNOSIS — M545 Low back pain, unspecified: Secondary | ICD-10-CM

## 2015-09-21 DIAGNOSIS — R8290 Unspecified abnormal findings in urine: Secondary | ICD-10-CM

## 2015-09-21 DIAGNOSIS — R4589 Other symptoms and signs involving emotional state: Secondary | ICD-10-CM | POA: Insufficient documentation

## 2015-09-21 DIAGNOSIS — C9110 Chronic lymphocytic leukemia of B-cell type not having achieved remission: Secondary | ICD-10-CM

## 2015-09-21 DIAGNOSIS — R161 Splenomegaly, not elsewhere classified: Secondary | ICD-10-CM

## 2015-09-21 DIAGNOSIS — R7989 Other specified abnormal findings of blood chemistry: Secondary | ICD-10-CM

## 2015-09-21 DIAGNOSIS — F418 Other specified anxiety disorders: Secondary | ICD-10-CM | POA: Diagnosis not present

## 2015-09-21 DIAGNOSIS — E291 Testicular hypofunction: Secondary | ICD-10-CM

## 2015-09-21 DIAGNOSIS — R635 Abnormal weight gain: Secondary | ICD-10-CM | POA: Diagnosis not present

## 2015-09-21 LAB — UA/M W/RFLX CULTURE, ROUTINE
BILIRUBIN UA: NEGATIVE
GLUCOSE, UA: NEGATIVE
Ketones, UA: NEGATIVE
Leukocytes, UA: NEGATIVE
NITRITE UA: NEGATIVE
PH UA: 7 (ref 5.0–7.5)
PROTEIN UA: NEGATIVE
Specific Gravity, UA: 1.02 (ref 1.005–1.030)
UUROB: 0.2 mg/dL (ref 0.2–1.0)

## 2015-09-21 LAB — MICROSCOPIC EXAMINATION
EPITHELIAL CELLS (NON RENAL): NONE SEEN /HPF (ref 0–10)
RBC MICROSCOPIC, UA: NONE SEEN /HPF (ref 0–?)
WBC UA: NONE SEEN /HPF (ref 0–?)

## 2015-09-21 MED ORDER — MELOXICAM 15 MG PO TABS: 15.0000 mg | ORAL_TABLET | Freq: Every day | ORAL | Status: DC

## 2015-09-21 MED ORDER — DULOXETINE HCL 30 MG PO CPEP: ORAL_CAPSULE | ORAL | Status: DC

## 2015-09-21 NOTE — Assessment & Plan Note (Signed)
Patient talked about previous use of xanax and valium; I do not want to start him on a benzodiazepine; he apparently has early CLL with no need for treatment at this time, just being monitored; will start SNRI (which may have added benefit of helping with back pain)

## 2015-09-21 NOTE — Assessment & Plan Note (Addendum)
Check today and consider replacement therapy; he seemed very interested in getting this started immediately; I explained we'll want to get labs and then see him back in the office to do a full discussion about the risks, including blood clots, changes in cholesterol panel, heart disease, etc.; if he is in a rush to get this started, he may return later this week for that visit

## 2015-09-21 NOTE — Assessment & Plan Note (Addendum)
With anxiety and depression related to new diagnosis; I am not interested in treating this gentleman with benzodiazepines; instead, I would prefer to treat with SSRI or SNRI; I opted for the SNRI which may give the added benefit of helping his back pain; return for close f/u

## 2015-09-21 NOTE — Patient Instructions (Addendum)
Let's taper up on a new medicine called cymbalta Don't ever stop this abruptly; when the day comes, we'll taper it back down Stop all other NSAIDs Okay to take tylenol per package directions Start meloxicam for pain control Try turmeric as a natural anti-inflammatory (for pain and arthritis). It comes in capsules where you buy aspirin and fish oil, but also as a spice where you buy pepper and garlic powder. We'll get labs today and bring you back Thursday or Friday to review and consider starting testosterone Check out the information at familydoctor.org entitled "What It Takes to Lose Weight" Try to lose between 1-2 pounds per week by taking in fewer calories and burning off more calories You can succeed by limiting portions, limiting foods dense in calories and fat, becoming more active, and drinking 8 glasses of water a day (64 ounces) Don't skip meals, especially breakfast, as skipping meals may alter your metabolism Do not use over-the-counter weight loss pills or gimmicks that claim rapid weight loss A healthy BMI (or body mass index) is between 18.5 and 24.9 You can calculate your ideal BMI at the [place] website [employer]fr

## 2015-09-21 NOTE — Assessment & Plan Note (Addendum)
Check TSH today; patient told heme-onc doctor he was eating too much, so this may just be excess calories

## 2015-09-21 NOTE — Progress Notes (Signed)
BP 121/80 mmHg  Pulse 81  Temp(Src) 97.2 F (36.2 C)  Ht 5' 11.25" (1.81 m)  Wt 290 lb (131.543 kg)  BMI 40.15 kg/m2  SpO2 96%   Subjective:    Patient ID: Albert Sanders, male    DOB: [birth year], 49 y.o.   MRN: [id number]  HPI: Albert Sanders is a 49 y.o. male  Chief Complaint  Patient presents with  . Low Testosterone    has a history of it being low  . Back Pain  . Anxiety    He was diagnosed with leukemia, having anxiety due to it   He usually sees Dr. Jeananne Rama, but I am glad to see him today for this appointment for low testosterone, back pain, and anxiety  He has been in here two or three times for kidney stones; had pain along the left side, down into the scrotum, pain was debilitating; they did a scan and found spleen was enlarged and did bone marrow biopsy and found leukemia; still has debilitating pain; they sent him to [place]; this time he went and they did a cystoscopy; no kidney stones; they told him to just return in one year, no cause for the back pain; he won't go back there; already had finger test (digital rectal exam) for his prostate, but we cannot find a PSA done; he wants to start testosterone therapy, but says he won't go back to the urologist and asked if that's something we can do here; they treated him for a kidney infection; gave him antibiotics; just recently; urine smells bad still though he thinks  He has been super tired; he feels like he is losing some of his muscle mass; no loss of hair, no testicular atrophy; decrease in sex drive; has gotten viagra (not prescribed) and cialis (prescribed) in the past; they don't work he says and it's affecting life at home  The back pain is over the left kidney; six weeks of this kind of pain, but has had other chronic back pain before that; used vicodin and percocet in the past and asked if I could give him something like percocet when the pain gets bad; no pain in the middle of the back; it is  severe when it happens, down both legs; debilitating pain; right leg feels weaker than left; no numbness in the legsdefinitely has weakness in the legs; he has torn something in the right knee; also has issue with right shoulder, but he wants to put those on the back burner now, will address the back pain and the testosterone and anxiety today he says  He has trouble sleeping, lots of anxiety with the new diagnosis of leukemia; so tired all the time; job has been super with being supportive; he has taken xanax in the past, valium in the past; his anxiety occurs most if not all days of the week; pretty down; I asked more about the leukemia and he says they are just watching it; apparently, it is very early in the course  He has gained weight since last visit; other doctor noticed this, about twenty pounds of weight gain; note from Dr. Mike Gip reviewed; 300+ pounds at visit on October 21st; B-cell chronic lymphocytic leukemia (CLL); he is to return to hematologist-oncologist in 3 months  Relevant past medical, surgical, family and social history reviewed and updated as indicated.  No fam hx of leukemia; no thyroid disease in the family  Interim medical history since our last visit reviewed.  Allergies and  medications reviewed and updated.  Review of Systems Per HPI unless specifically indicated above     Objective:    BP 121/80 mmHg  Pulse 81  Temp(Src) 97.2 F (36.2 C)  Ht 5' 11.25" (1.81 m)  Wt 290 lb (131.543 kg)  BMI 40.15 kg/m2  SpO2 96%  Wt Readings from Last 3 Encounters:  09/24/15 285 lb (129.275 kg)  09/23/15 285 lb (129.275 kg)  09/21/15 290 lb (131.543 kg)    Physical Exam  Constitutional:  Morbidly obese male in no distress  HENT:  Mouth/Throat: Mucous membranes are normal.  Eyes: No scleral icterus.  Neck: No thyromegaly present.  Cardiovascular: Normal rate and regular rhythm.   Pulmonary/Chest: Effort normal and breath sounds normal.  Abdominal: Soft. He  exhibits no distension (morbidly obese).  Musculoskeletal:       Lumbar back: He exhibits tenderness (left of midline). He exhibits no bony tenderness (no pain with palpation over the midline), no swelling, no edema, no deformity and no spasm.  Neurological: He is alert. He displays no atrophy and no tremor. He exhibits normal muscle tone. Gait normal.  Reflex Scores:      Patellar reflexes are 2+ on the right side and 1+ on the left side. Skin: Skin is warm and dry. No rash noted. No pallor.  Psychiatric: He has a normal mood and affect.      Assessment & Plan:   Problem List Items Addressed This Visit      Other   Splenomegaly    I asked if they thought splenomegaly was causing pain and it does not sound like that is the case; he will continue to f/u with heme-onc for this issue      Chronic lymphocytic leukemia ([place])    With anxiety and depression related to new diagnosis; I am not interested in treating this gentleman with benzodiazepines; instead, I would prefer to treat with SSRI or SNRI; I opted for the SNRI which may give the added benefit of helping his back pain; return for close f/u      Relevant Medications   meloxicam (MOBIC) 15 MG tablet   Low testosterone - Primary    Check today and consider replacement therapy; he seemed very interested in getting this started immediately; I explained we'll want to get labs and then see him back in the office to do a full discussion about the risks, including blood clots, changes in cholesterol panel, heart disease, etc.; if he is in a rush to get this started, he may return later this week for that visit      Relevant Orders   Testosterone,Free and Total (Completed)   PSA (Completed)   Abnormal weight gain    Check TSH today; patient told heme-onc doctor he was eating too much, so this may just be excess calories      Relevant Orders   TSH   Abnormal urine   Relevant Orders   UA/M w/rflx Culture, Routine (Completed)    Anxiety about health    Patient talked about previous use of xanax and valium; I do not want to start him on a benzodiazepine; he apparently has early CLL with no need for treatment at this time, just being monitored; will start SNRI (which may have added benefit of helping with back pain)      Relevant Medications   DULoxetine (CYMBALTA) 30 MG capsule   Hypocalcemia   Relevant Orders   Vit D  25 hydroxy (rtn osteoporosis monitoring) (Completed)  Basic metabolic panel (Completed)   Hypomagnesemia   Relevant Orders   Magnesium (Completed)   Low back pain    Patient asked for narcotics, but I am not going to treat him with narcotics; I will be glad to provide meloxicam; weight loss will be important      Relevant Medications   meloxicam (MOBIC) 15 MG tablet   Morbid obesity ([place])    See after visit summary; weight loss encouraged         Follow up plan: Return Thursday or Friday, for labs and testosterone.  Meds ordered this encounter  Medications  . DULoxetine (CYMBALTA) 30 MG capsule    Sig: One by mouth daily for ten days, then two a day    Dispense:  50 capsule    Refill:  0  . meloxicam (MOBIC) 15 MG tablet    Sig: Take 1 tablet (15 mg total) by mouth daily.    Dispense:  30 tablet    Refill:  0   An after-visit summary was printed and given to the patient at [facility].  Please see the patient instructions which may contain other information and recommendations beyond what is mentioned above in the assessment and plan.

## 2015-09-21 NOTE — Assessment & Plan Note (Addendum)
I asked if they thought splenomegaly was causing pain and it does not sound like that is the case; he will continue to f/u with heme-onc for this issue

## 2015-09-22 ENCOUNTER — Telehealth: Payer: Self-pay | Admitting: Family Medicine

## 2015-09-22 LAB — TSH: TSH: 2.07 u[IU]/mL (ref 0.450–4.500)

## 2015-09-22 LAB — VITAMIN D 25 HYDROXY (VIT D DEFICIENCY, FRACTURES): Vit D, 25-Hydroxy: 22 ng/mL — ABNORMAL LOW (ref 30.0–100.0)

## 2015-09-22 LAB — BASIC METABOLIC PANEL
BUN / CREAT RATIO: 21 — AB (ref 9–20)
BUN: 18 mg/dL (ref 6–24)
CO2: 25 mmol/L (ref 18–29)
CREATININE: 0.85 mg/dL (ref 0.76–1.27)
Calcium: 9.1 mg/dL (ref 8.7–10.2)
Chloride: 98 mmol/L (ref 97–106)
GFR, EST AFRICAN AMERICAN: 119 mL/min/{1.73_m2} (ref >59)
GFR, EST NON AFRICAN AMERICAN: 103 mL/min/{1.73_m2} (ref >59)
GLUCOSE: 126 mg/dL — AB (ref 65–99)
Potassium: 5.4 mmol/L — ABNORMAL HIGH (ref 3.5–5.2)
Sodium: 141 mmol/L (ref 136–144)

## 2015-09-22 LAB — MAGNESIUM: MAGNESIUM: 2.3 mg/dL (ref 1.6–2.3)

## 2015-09-22 NOTE — Telephone Encounter (Signed)
Pt was returning a phone call and would like to get a call back

## 2015-09-23 ENCOUNTER — Emergency Department
Admission: EM | Admit: 2015-09-23 | Discharge: 2015-09-24 | Disposition: A | Discharge status: Home / Self Care | Payer: No Typology Code available for payment source | Attending: Emergency Medicine | Admitting: Emergency Medicine

## 2015-09-23 DIAGNOSIS — W6101XA Bitten by parrot, initial encounter: Secondary | ICD-10-CM | POA: Diagnosis not present

## 2015-09-23 DIAGNOSIS — Z79899 Other long term (current) drug therapy: Secondary | ICD-10-CM | POA: Insufficient documentation

## 2015-09-23 DIAGNOSIS — Y9389 Activity, other specified: Secondary | ICD-10-CM | POA: Insufficient documentation

## 2015-09-23 DIAGNOSIS — Z791 Long term (current) use of non-steroidal anti-inflammatories (NSAID): Secondary | ICD-10-CM | POA: Diagnosis not present

## 2015-09-23 DIAGNOSIS — Y998 Other external cause status: Secondary | ICD-10-CM | POA: Insufficient documentation

## 2015-09-23 DIAGNOSIS — Y9289 Other specified places as the place of occurrence of the external cause: Secondary | ICD-10-CM | POA: Insufficient documentation

## 2015-09-23 DIAGNOSIS — L03011 Cellulitis of right finger: Secondary | ICD-10-CM | POA: Diagnosis not present

## 2015-09-23 DIAGNOSIS — S61254A Open bite of right ring finger without damage to nail, initial encounter: Secondary | ICD-10-CM | POA: Diagnosis present

## 2015-09-23 LAB — TESTOSTERONE,FREE AND TOTAL: Testosterone: 116 ng/dL — ABNORMAL LOW (ref 348–1197)

## 2015-09-23 MED ORDER — ONDANSETRON 4 MG PO TBDP
4.0000 mg | ORAL_TABLET | Freq: Once | ORAL | Status: AC
  Administered 2015-09-23: 4 mg via ORAL
  Filled 2015-09-23: qty 1

## 2015-09-23 MED ORDER — CEFTRIAXONE SODIUM 1 G IJ SOLR
1.0000 g | Freq: Once | INTRAMUSCULAR | Status: AC
  Administered 2015-09-23: 1 g via INTRAMUSCULAR
  Filled 2015-09-23: qty 10

## 2015-09-23 MED ORDER — LIDOCAINE HCL (PF) 1 % IJ SOLN
5.0000 mL | Freq: Once | INTRAMUSCULAR | Status: AC
  Administered 2015-09-23: 5 mL via INTRADERMAL

## 2015-09-23 MED ORDER — OXYCODONE-ACETAMINOPHEN 5-325 MG PO TABS
2.0000 | ORAL_TABLET | Freq: Once | ORAL | Status: AC
  Administered 2015-09-23: 2 via ORAL
  Filled 2015-09-23: qty 2

## 2015-09-23 MED ORDER — MORPHINE SULFATE (PF) 4 MG/ML IV SOLN
4.0000 mg | Freq: Once | INTRAVENOUS | Status: AC
  Administered 2015-09-23: 4 mg via INTRAMUSCULAR
  Filled 2015-09-23: qty 1

## 2015-09-23 MED ORDER — OXYCODONE-ACETAMINOPHEN 5-325 MG PO TABS: 1.0000 | ORAL_TABLET | Freq: Four times a day (QID) | ORAL | Status: DC | PRN

## 2015-09-23 MED ORDER — LIDOCAINE HCL (PF) 1 % IJ SOLN
INTRAMUSCULAR | Status: AC
  Administered 2015-09-23: 5 mL via INTRADERMAL
  Filled 2015-09-23: qty 5

## 2015-09-23 MED ORDER — AMOXICILLIN-POT CLAVULANATE 875-125 MG PO TABS: 1.0000 | ORAL_TABLET | Freq: Two times a day (BID) | ORAL | Status: DC

## 2015-09-23 NOTE — ED Provider Notes (Signed)
[facility] Emergency Department Provider Note  ____________________________________________  Time seen: Approximately 10:50 PM  I have reviewed the triage vital signs and the nursing notes.   HISTORY  Chief Complaint No chief complaint on file.    HPI Albert Sanders is a 49 y.o. male who presents to emergency department complaining ofpain to his ring finger right hand. He states that he was bitten by his parrot 2 days ago. He states that he immediately had some pain at the time but it resolved however over the last 12 hours he has had increased swelling, erythema, and pain. 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 He states that the pain has almost become unbearable. It's worse with movement.   Past Medical History  Diagnosis Date  . Kidney stone   . GERD (gastroesophageal reflux disease)   . Cancer ([place])     leukemia  . Complication of anesthesia     woke up during gallbladder surgery-pt states it takes alot of anesthesia to sedate him   . Urinary calculus   . Clavicular fracture     Patient Active Problem List   Diagnosis Date Noted  . Abnormal urine 09/21/2015  . Anxiety about health 09/21/2015  . Hypocalcemia 09/21/2015  . Hypomagnesemia 09/21/2015  . Low testosterone 09/18/2015  . Abnormal weight gain 09/18/2015  . Urinary calculus   . Clavicular fracture   . Flank pain 08/28/2015  . Lymphocytosis 08/20/2015  . Chronic lymphocytic leukemia ([place]) 08/20/2015  . Adenopathy 07/31/2015  . Night sweats 07/31/2015  . Splenomegaly 07/31/2015    Past Surgical History  Procedure Laterality Date  . Cholecystectomy    . Anterior cruciate ligament repair    . Bone marrow biopsy  08-2015  . Biceps tendon repair Right   . Cystoscopy w/ retrogrades Bilateral 09/16/2015    Procedure: CYSTOSCOPY WITH RETROGRADE PYELOGRAM;  Surgeon: Nickie Retort, MD;  Location: [facility] ORS;  Service: Urology;  Laterality:  Bilateral;    Current Outpatient Rx  Name  Route  Sig  Dispense  Refill  . amoxicillin-clavulanate (AUGMENTIN) 875-125 MG tablet   Oral   Take 1 tablet by mouth 2 (two) times daily.   14 tablet   0   . DULoxetine (CYMBALTA) 30 MG capsule      One by mouth daily for ten days, then two a day   50 capsule   0   . meloxicam (MOBIC) 15 MG tablet   Oral   Take 1 tablet (15 mg total) by mouth daily.   30 tablet   0   . Multiple Vitamin (MULTIVITAMIN) tablet   Oral   Take 1 tablet by mouth daily.         Marland Kitchen omeprazole (PRILOSEC OTC) 20 MG tablet   Oral   Take 20 mg by mouth every morning.          Marland Kitchen oxyCODONE-acetaminophen (ROXICET) 5-325 MG tablet   Oral   Take 1 tablet by mouth every 6 (six) hours as needed for severe pain.   20 tablet   0     Allergies Contrast media  Family History  Problem Relation Age of Onset  . Diabetes Paternal Uncle   . Cancer Paternal Grandmother     breast  . Diabetes Paternal Grandfather   . Heart disease Neg Hx   . Hypertension Neg Hx   . Stroke Neg Hx   . COPD Neg Hx  Social History Social History  Substance Use Topics  . Smoking status: Never Smoker   . Smokeless tobacco: Current User    Types: Snuff  . Alcohol Use: Yes     Comment: rare    Review of Systems Constitutional: No fever/chills Eyes: No visual changes. ENT: No sore throat. Cardiovascular: Denies chest pain. Respiratory: Denies shortness of breath. Gastrointestinal: No abdominal pain.  No nausea, no vomiting.  No diarrhea.  No constipation. Genitourinary: Negative for dysuria. Musculoskeletal: Negative for back pain. Endorses fourth finger pain right hand. Skin: Negative for rash. Neurological: Negative for headaches, focal weakness or numbness.  10-point ROS otherwise negative.  ____________________________________________   PHYSICAL EXAM:  VITAL SIGNS: ED Triage Vitals  Enc Vitals Group     BP 09/23/15 2228 129/74 mmHg     Pulse Rate  09/23/15 2228 85     Resp 09/23/15 2228 20     Temp 09/23/15 2228 97.7 F (36.5 C)     Temp Source 09/23/15 2228 Oral     SpO2 09/23/15 2228 95 %     Weight 09/23/15 2228 285 lb (129.275 kg)     Height 09/23/15 2228 6' (1.829 m)     Head Cir --      Peak Flow --      Pain Score 09/23/15 2228 8     Pain Loc --      Pain Edu? --      Excl. in [place]? --     Constitutional: Alert and oriented. Well appearing and in no acute distress. Eyes: Conjunctivae are normal. PERRL. EOMI. Head: Atraumatic. Nose: No congestion/rhinnorhea. Mouth/Throat: Mucous membranes are moist.  Oropharynx non-erythematous. Neck: No stridor.   Hematological/Lymphatic/Immunilogical: No cervical lymphadenopathy Cardiovascular: Normal rate, regular rhythm. Grossly normal heart sounds.  Good peripheral circulation. Respiratory: Normal respiratory effort.  No retractions. Lungs CTAB. Gastrointestinal: Soft and nontender. No distention. No abdominal bruits. No CVA tenderness. Musculoskeletal: No lower extremity tenderness nor edema.  No joint effusions. Patient has significant erythema and edema to the palmar aspect of fourth digit right hand. It is a puncture wound noted with no visible foreign body. Patient endorses extreme tenderness to palpation or soft tissue surrounding the puncture wound. Sensation intact distally. Cap refill brisk. Full range of motion to finger. Neurologic:  Normal speech and language. No gross focal neurologic deficits are appreciated. No gait instability. Skin:  Skin is warm, dry and intact. No rash noted. Psychiatric: Mood and affect are normal. Speech and behavior are normal.  ____________________________________________   LABS (all labs ordered are listed, but only abnormal results are displayed)  Labs Reviewed - No data to  display ____________________________________________  EKG   ____________________________________________  RADIOLOGY   ____________________________________________   PROCEDURES  Procedure(s) performed: None  Critical Care performed: No  ____________________________________________   INITIAL IMPRESSION / ASSESSMENT AND PLAN / ED COURSE  Pertinent labs & imaging results that were available during my care of the patient were reviewed by me and considered in my medical decision making (see chart for details).  The patient's history, symptoms, physical exam are consistent with cellulitis status post a parrot bite of his fourth digit right hand. Advised the patient of findings and diagnosis and he verbalizes understanding of same. I will give the patient and injection of Rocephin as well as provide pain relief here in the emergency department. The patient will be sent home with antibiotics and pain medication. Patient is to follow-up with primary care or the emergency department should symptoms worsen.  Patient verbalizes understanding of treatment plan and verbalizes compliance with same. ____________________________________________   FINAL CLINICAL IMPRESSION(S) / ED DIAGNOSES  Final diagnoses:  Bitten by parrot, initial encounter  Cellulitis of finger of right hand      Darletta Moll, PA-C 09/23/15 2347  Orbie Pyo, MD 09/23/15 [phone]

## 2015-09-23 NOTE — ED Notes (Signed)
Pt to triage via w/c with no distress; st Monday was bitten by a parrot to right finger; c/o pain finger/hand since

## 2015-09-23 NOTE — ED Notes (Signed)
Upon d.c pt requesting more pain meds and to speak to the provider. PA Roderic Palau made aware, at bedside at this time

## 2015-09-23 NOTE — ED Notes (Signed)
Pt waiting in room until ride arrives at this time due to pain meds given. Pt made aware and verbalized understanding at this time.

## 2015-09-23 NOTE — Telephone Encounter (Signed)
Dr. Sanda Klein, did you call him? I didn't have a message in my box.

## 2015-09-23 NOTE — Discharge Instructions (Signed)

## 2015-09-23 NOTE — Telephone Encounter (Signed)
No, I don't believe I called him He has an appt later this week I need his TOTAL testosterone; it did not come through

## 2015-09-24 ENCOUNTER — Encounter: Payer: Self-pay | Admitting: Emergency Medicine

## 2015-09-24 ENCOUNTER — Emergency Department
Admission: EM | Admit: 2015-09-24 | Discharge: 2015-09-24 | Disposition: A | Discharge status: Home / Self Care | Payer: No Typology Code available for payment source | Source: Home / Self Care | Attending: Emergency Medicine | Admitting: Emergency Medicine

## 2015-09-24 DIAGNOSIS — L03011 Cellulitis of right finger: Secondary | ICD-10-CM

## 2015-09-24 MED ORDER — OXYCODONE-ACETAMINOPHEN 5-325 MG PO TABS
1.0000 | ORAL_TABLET | Freq: Once | ORAL | Status: AC
  Administered 2015-09-24: 1 via ORAL
  Filled 2015-09-24: qty 1

## 2015-09-24 MED ORDER — AMOXICILLIN-POT CLAVULANATE 875-125 MG PO TABS
1.0000 | ORAL_TABLET | Freq: Once | ORAL | Status: AC
  Administered 2015-09-24: 1 via ORAL
  Filled 2015-09-24: qty 1

## 2015-09-24 NOTE — ED Notes (Signed)
Pt ride arrived at this time. Pt left at this time, no acute distress noted.

## 2015-09-24 NOTE — ED Provider Notes (Signed)
[facility] Emergency Department Provider Note  ____________________________________________  Time seen: 2:40 AM  I have reviewed the triage vital signs and the nursing notes.   HISTORY  Chief Complaint Hand Pain    HPI Albert Sanders is a 49 y.o. male who complains of pain in the right fourth finger related to a parent bite 2 days ago. She reports that after his care bit him everything was fine but then just this afternoon it suddenly became red and painful. He was seen in the emergency department this evening around 9 or 10 PM, given Percocet and IM ceftriaxone and prescriptions for Augmentin and Percocet.  However, the patient never left and instead checked back in, is requesting IV pain medication and hospitalization for pain control. No new injuries since his last evaluation. No fevers chills nausea vomiting.     Past Medical History  Diagnosis Date  . Kidney stone   . GERD (gastroesophageal reflux disease)   . Cancer ([place])     leukemia  . Complication of anesthesia     woke up during gallbladder surgery-pt states it takes alot of anesthesia to sedate him   . Urinary calculus   . Clavicular fracture      Patient Active Problem List   Diagnosis Date Noted  . Abnormal urine 09/21/2015  . Anxiety about health 09/21/2015  . Hypocalcemia 09/21/2015  . Hypomagnesemia 09/21/2015  . Low testosterone 09/18/2015  . Abnormal weight gain 09/18/2015  . Urinary calculus   . Clavicular fracture   . Flank pain 08/28/2015  . Lymphocytosis 08/20/2015  . Chronic lymphocytic leukemia ([place]) 08/20/2015  . Adenopathy 07/31/2015  . Night sweats 07/31/2015  . Splenomegaly 07/31/2015     Past Surgical History  Procedure Laterality Date  . Cholecystectomy    . Anterior cruciate ligament repair    . Bone marrow biopsy  08-2015  . Biceps tendon repair Right   . Cystoscopy w/ retrogrades Bilateral 09/16/2015    Procedure: CYSTOSCOPY WITH RETROGRADE  PYELOGRAM;  Surgeon: Nickie Retort, MD;  Location: [facility] ORS;  Service: Urology;  Laterality: Bilateral;     Current Outpatient Rx  Name  Route  Sig  Dispense  Refill  . amoxicillin-clavulanate (AUGMENTIN) 875-125 MG tablet   Oral   Take 1 tablet by mouth 2 (two) times daily.   14 tablet   0   . DULoxetine (CYMBALTA) 30 MG capsule      One by mouth daily for ten days, then two a day   50 capsule   0   . meloxicam (MOBIC) 15 MG tablet   Oral   Take 1 tablet (15 mg total) by mouth daily.   30 tablet   0   . Multiple Vitamin (MULTIVITAMIN) tablet   Oral   Take 1 tablet by mouth daily.         Marland Kitchen omeprazole (PRILOSEC OTC) 20 MG tablet   Oral   Take 20 mg by mouth every morning.          Marland Kitchen oxyCODONE-acetaminophen (ROXICET) 5-325 MG tablet   Oral   Take 1 tablet by mouth every 6 (six) hours as needed for severe pain.   20 tablet   0      Allergies Contrast media   Family History  Problem Relation Age of Onset  . Diabetes Paternal Uncle   . Cancer Paternal Grandmother     breast  . Diabetes Paternal Grandfather   . Heart disease Neg Hx   .  Hypertension Neg Hx   . Stroke Neg Hx   . COPD Neg Hx     Social History Social History  Substance Use Topics  . Smoking status: Never Smoker   . Smokeless tobacco: Current User    Types: Snuff  . Alcohol Use: Yes     Comment: rare    Review of Systems  Constitutional:   No fever or chills. No weight changes Eyes:   No blurry vision or double vision.  ENT:   No sore throat. Cardiovascular:   No chest pain. Respiratory:   No dyspnea or cough. Gastrointestinal:   Negative for abdominal pain, vomiting and diarrhea.  No BRBPR or melena. Genitourinary:   Negative for dysuria, urinary retention, bloody urine, or difficulty urinating. Musculoskeletal:   Right fourth finger pain as above Skin:   Negative for rash. Neurological:   Negative for headaches, focal weakness or numbness. Psychiatric:  No anxiety or  depression.   Endocrine:  No hot/cold intolerance, changes in energy, or sleep difficulty.  10-point ROS otherwise negative.  ____________________________________________   PHYSICAL EXAM:  VITAL SIGNS: ED Triage Vitals  Enc Vitals Group     BP 09/24/15 0221 134/83 mmHg     Pulse Rate 09/24/15 0221 93     Resp 09/24/15 0221 22     Temp 09/24/15 0221 99.6 F (37.6 C)     Temp Source 09/24/15 0221 Oral     SpO2 09/24/15 0221 93 %     Weight 09/24/15 0221 285 lb (129.275 kg)     Height 09/24/15 0221 6' (1.829 m)     Head Cir --      Peak Flow --      Pain Score 09/24/15 0222 10     Pain Loc --      Pain Edu? --      Excl. in [place]? --      Constitutional:   Alert and oriented. Well appearing and in no distress. Eyes:   No scleral icterus. No conjunctival pallor. PERRL. EOMI ENT   Head:   Normocephalic and atraumatic. Cardiovascular:   Regular rate, normal distal pulses in the right upper extremity  Musculoskeletal:   Mild erythema of the right distal fourth finger. There is a superficial abrasion appearing injury over the distal phalanx. No appreciable swelling. No tenosynovitis or limitation in range of motion. Good cap refill, exam is inconsistent regarding tenderness.  No evidence of any spreading infection beyond the muscle involvement of the distal right fourth finger. No involvement of the hand or tendons proximal to the wrist. No pain with percussion of the tendon sheaths or flexion and extension of the wrist Neurologic:   Normal speech and language.  CN 2-10 normal. Motor grossly intact.  Normal gait. No gross focal neurologic deficits are appreciated.  Skin:    Skin is warm, dry and intact. No rash noted.  No petechiae, purpura, or bullae. Psychiatric:   Mood and affect are normal. Speech and behavior are normal.  ____________________________________________    LABS (pertinent positives/negatives) (all labs ordered are listed, but only abnormal results are  displayed) Labs Reviewed - No data to display ____________________________________________   EKG    ____________________________________________    RADIOLOGY    ____________________________________________   PROCEDURES   ____________________________________________   INITIAL IMPRESSION / ASSESSMENT AND PLAN / ED COURSE  Pertinent labs & imaging results that were available during my care of the patient were reviewed by me and considered in my medical decision making (see  chart for details).  Patient presents with a very minimal appearing injury that is not even clearly infected but should be covered with Augmentin due to exposure to Avian microorganisms. Vital signs are unremarkable. Patient is reporting severe unbearable pain and requesting more and more pain medication. Specifically he wants IV opioids. This is very out of proportion to the extent of injury. Patient was counseled to keep the injury iced, elevated, and to take the Augmentin and the Percocet he started been prescribed. I did check the [place] controlled substance reporting system and found that the patient has filled 4 opioid pain medication prescriptions this same month, not including the one that was given to him just a few hours ago. At this time there is no indication for hospitalization or transfer or surgical evaluation. He is given an Augmentin here in the emergency department to avoid any delay whatsoever in proper antibiotic prophylaxis. Patient offered oral Percocet here in the emergency department since he will go to fill his prescription for a few more hours, and since this is what he'll be on for his outpatient management there is no need to be giving him parenteral opioids at this time.   ____________________________________________   FINAL CLINICAL IMPRESSION(S) / ED DIAGNOSES  Final diagnoses:  Cellulitis of finger of right hand      Carrie Mew, MD 09/24/15 [phone]

## 2015-09-24 NOTE — ED Notes (Signed)
MD at bedside. 

## 2015-09-24 NOTE — Discharge Instructions (Signed)
You were prescribed a medication that is potentially sedating. Do not drink alcohol, drive or participate in any other potentially dangerous activities while taking this medication as it may make you sleepy. Do not take this medication with any other sedating medications, either prescription or over-the-counter. If you were prescribed Percocet or Vicodin, do not take these with acetaminophen (Tylenol) as it is already contained within these medications.   Opioid pain medications (or "narcotics") can be habit forming.  Use it as little as possible to achieve adequate pain control.  Do not use or use it with extreme caution if you have a history of opiate abuse or dependence.  If you are on a pain contract with your primary care doctor or a pain specialist, be sure to let them know you were prescribed this medication today from the  [facility].  This medication is intended for your use only - do not give any to anyone else and keep it in a secure place where nobody else, especially children and pets, have access to it.  It will also cause or worsen constipation, so you may want to consider taking an over-the-counter stool softener while you are taking this medication.  Cellulitis Cellulitis is an infection of the skin and the tissue beneath it. The infected area is usually red and tender. Cellulitis occurs most often in the arms and lower legs.  CAUSES  Cellulitis is caused by bacteria that enter the skin through cracks or cuts in the skin. The most common types of bacteria that cause cellulitis are staphylococci and streptococci. SIGNS AND SYMPTOMS   Redness and warmth.  Swelling.  Tenderness or pain.  Fever. DIAGNOSIS  Your health care provider can usually determine what is wrong based on a physical exam. Blood tests may also be done. TREATMENT  Treatment usually involves taking an antibiotic medicine. HOME CARE INSTRUCTIONS   Take your antibiotic medicine as  directed by your health care provider. Finish the antibiotic even if you start to feel better.  Keep the infected arm or leg elevated to reduce swelling.  Apply a warm cloth to the affected area up to 4 times per day to relieve pain.  Take medicines only as directed by your health care provider.  Keep all follow-up visits as directed by your health care provider. SEEK MEDICAL CARE IF:   You notice red streaks coming from the infected area.  Your red area gets larger or turns dark in color.  Your bone or joint underneath the infected area becomes painful after the skin has healed.  Your infection returns in the same area or another area.  You notice a swollen bump in the infected area.  You develop new symptoms.  You have a fever. SEEK IMMEDIATE MEDICAL CARE IF:   You feel very sleepy.  You develop vomiting or diarrhea.  You have a general ill feeling (malaise) with muscle aches and pains.   This information is not intended to replace advice given to you by your health care provider. Make sure you discuss any questions you have with your health care provider.   Document Released: 08/24/2005 Document Revised: 08/05/2015 Document Reviewed: 01/30/2012 Elsevier Interactive Patient Education 2016 [place] Infection When an infection is around the nail, it is called a paronychia. When it appears over the tip of the finger, it is called a felon. These infections are due to minor injuries or cracks in the skin. If they are not treated properly, they can lead to  bone infection and permanent damage to the fingernail. Incision and drainage is necessary if a pus pocket (an abscess) has formed. Antibiotics and pain medicine may also be needed. Keep your hand elevated for the next 2-3 days to reduce swelling and pain. If a pack was placed in the abscess, it should be removed in 1-2 days by your caregiver. Soak the finger in warm water for 20 minutes 4 times daily to help  promote drainage. Keep the hands as dry as possible. Wear protective gloves with cotton liners. See your caregiver for follow-up care as recommended.  HOME CARE INSTRUCTIONS   Keep wound clean, dry and dressed as suggested by your caregiver.  Soak in warm salt water for fifteen minutes, four times per day for bacterial infections.  Your caregiver will prescribe an antibiotic if a bacterial infection is suspected. Take antibiotics as directed and finish the prescription, even if the problem appears to be improving before the medicine is gone.  Only take over-the-counter or prescription medicines for pain, discomfort, or fever as directed by your caregiver. SEEK IMMEDIATE MEDICAL CARE IF:  There is redness, swelling, or increasing pain in the wound.  Pus or any other unusual drainage is coming from the wound.  An unexplained oral temperature above 102 F (38.9 C) develops.  You notice a foul smell coming from the wound or dressing. MAKE SURE YOU:   Understand these instructions.  Monitor your condition.  Contact your caregiver if you are getting worse or not improving.   This information is not intended to replace advice given to you by your health care provider. Make sure you discuss any questions you have with your health care provider.   Document Released: 12/22/2004 Document Revised: 02/06/2012 Document Reviewed: 05/04/2015 Elsevier Interactive Patient Education [employer].

## 2015-09-24 NOTE — ED Notes (Signed)
Pt would like to add he has a throbbing headache; also pt would like to add he had a cystoscopy about a week ago and has not been drinking enough; pt specifically requested an IV; he will discuss with MD

## 2015-09-24 NOTE — ED Notes (Signed)
Patient returns to the ED via EMS for finger pain. Was bitten by a black headed parrot on Monday on the right ring finger. Was already seen and treated tonight for same however returns for pain control. Histrionic presentation of pain is described as "complete throbbing and a headache he did not have before." Patient continues to moan while shaking his right hand about and moving his legs while sitting in the wheelchair. Told EMS he "was not going to walk into the ER he wanted a wheelchair."

## 2015-09-25 ENCOUNTER — Ambulatory Visit: Payer: PRIVATE HEALTH INSURANCE

## 2015-09-25 ENCOUNTER — Other Ambulatory Visit: Payer: PRIVATE HEALTH INSURANCE

## 2015-09-25 LAB — PSA: Prostate Specific Ag, Serum: 0.3 ng/mL (ref 0.0–4.0)

## 2015-09-25 NOTE — Telephone Encounter (Signed)
Patient had appointment scheduled for testosterone but did not show. Total testosterone was already checked and results were back.

## 2015-09-26 ENCOUNTER — Encounter: Payer: Self-pay | Admitting: Emergency Medicine

## 2015-09-26 ENCOUNTER — Emergency Department
Admission: EM | Admit: 2015-09-26 | Discharge: 2015-09-27 | Disposition: A | Discharge status: Other Acute Inpatient Hospital | Payer: 59 | Attending: Emergency Medicine | Admitting: Emergency Medicine

## 2015-09-26 DIAGNOSIS — Z79899 Other long term (current) drug therapy: Secondary | ICD-10-CM | POA: Insufficient documentation

## 2015-09-26 DIAGNOSIS — Y998 Other external cause status: Secondary | ICD-10-CM | POA: Insufficient documentation

## 2015-09-26 DIAGNOSIS — M545 Low back pain, unspecified: Secondary | ICD-10-CM | POA: Insufficient documentation

## 2015-09-26 DIAGNOSIS — Z791 Long term (current) use of non-steroidal anti-inflammatories (NSAID): Secondary | ICD-10-CM | POA: Insufficient documentation

## 2015-09-26 DIAGNOSIS — Z792 Long term (current) use of antibiotics: Secondary | ICD-10-CM | POA: Insufficient documentation

## 2015-09-26 DIAGNOSIS — S61254A Open bite of right ring finger without damage to nail, initial encounter: Secondary | ICD-10-CM | POA: Diagnosis present

## 2015-09-26 DIAGNOSIS — M659 Synovitis and tenosynovitis, unspecified: Secondary | ICD-10-CM | POA: Diagnosis not present

## 2015-09-26 DIAGNOSIS — F419 Anxiety disorder, unspecified: Secondary | ICD-10-CM | POA: Insufficient documentation

## 2015-09-26 DIAGNOSIS — E669 Obesity, unspecified: Secondary | ICD-10-CM | POA: Insufficient documentation

## 2015-09-26 DIAGNOSIS — Y9389 Activity, other specified: Secondary | ICD-10-CM | POA: Diagnosis not present

## 2015-09-26 DIAGNOSIS — W6101XA Bitten by parrot, initial encounter: Secondary | ICD-10-CM | POA: Insufficient documentation

## 2015-09-26 DIAGNOSIS — Y9289 Other specified places as the place of occurrence of the external cause: Secondary | ICD-10-CM | POA: Diagnosis not present

## 2015-09-26 LAB — BASIC METABOLIC PANEL
ANION GAP: 7 (ref 5–15)
BUN: 22 mg/dL — ABNORMAL HIGH (ref 6–20)
CALCIUM: 9.1 mg/dL (ref 8.9–10.3)
CO2: 24 mmol/L (ref 22–32)
CREATININE: 0.79 mg/dL (ref 0.61–1.24)
Chloride: 105 mmol/L (ref 101–111)
Glucose, Bld: 114 mg/dL — ABNORMAL HIGH (ref 65–99)
Potassium: 3.9 mmol/L (ref 3.5–5.1)
SODIUM: 136 mmol/L (ref 135–145)

## 2015-09-26 LAB — CBC
HCT: 44.9 % (ref 40.0–52.0)
Hemoglobin: 14.7 g/dL (ref 13.0–18.0)
MCH: 26.6 pg (ref 26.0–34.0)
MCHC: 32.7 g/dL (ref 32.0–36.0)
MCV: 81.5 fL (ref 80.0–100.0)
PLATELETS: 205 10*3/uL (ref 150–440)
RBC: 5.52 MIL/uL (ref 4.40–5.90)
RDW: 14 % (ref 11.5–14.5)
WBC: 22.2 10*3/uL — AB (ref 3.8–10.6)

## 2015-09-26 MED ORDER — CIPROFLOXACIN IN D5W 400 MG/200ML IV SOLN
400.0000 mg | Freq: Once | INTRAVENOUS | Status: AC
  Administered 2015-09-27: 400 mg via INTRAVENOUS
  Filled 2015-09-26: qty 200

## 2015-09-26 MED ORDER — MORPHINE SULFATE (PF) 4 MG/ML IV SOLN
4.0000 mg | Freq: Once | INTRAVENOUS | Status: AC
  Administered 2015-09-26: 4 mg via INTRAVENOUS

## 2015-09-26 MED ORDER — ONDANSETRON HCL 4 MG/2ML IJ SOLN
4.0000 mg | Freq: Once | INTRAMUSCULAR | Status: AC
  Administered 2015-09-26: 4 mg via INTRAVENOUS
  Filled 2015-09-26: qty 2

## 2015-09-26 MED ORDER — MORPHINE SULFATE (PF) 4 MG/ML IV SOLN
INTRAVENOUS | Status: AC
  Administered 2015-09-26: 4 mg via INTRAVENOUS
  Filled 2015-09-26: qty 1

## 2015-09-26 MED ORDER — VANCOMYCIN HCL IN DEXTROSE 1-5 GM/200ML-% IV SOLN
1000.0000 mg | Freq: Once | INTRAVENOUS | Status: AC
  Administered 2015-09-26: 1000 mg via INTRAVENOUS
  Filled 2015-09-26: qty 200

## 2015-09-26 MED ORDER — MORPHINE SULFATE (PF) 4 MG/ML IV SOLN
4.0000 mg | Freq: Once | INTRAVENOUS | Status: AC
  Administered 2015-09-26: 4 mg via INTRAVENOUS
  Filled 2015-09-26: qty 1

## 2015-09-26 NOTE — ED Provider Notes (Signed)
[facility] Emergency Department Provider Note  ____________________________________________  Time seen: 10 PM  I have reviewed the triage vital signs and the nursing notes.   HISTORY  Chief Complaint Hand Pain and Wound Check    HPI Albert Sanders is a 49 y.o. male who presents with complaints of right fourth digit pain. Patient reports he was bitten by a parrot approximately 4-5 days ago. Seen soon after and given antibiotics and asked to follow up. He reports the pain has become much worse and he is unable to extend his finger and he feels pain traveling into his wrist and hand. He has had chills at home but denies fever. No history of diabetes.    Past Medical History  Diagnosis Date  . Kidney stone   . GERD (gastroesophageal reflux disease)   . Cancer ([place])     leukemia  . Complication of anesthesia     woke up during gallbladder surgery-pt states it takes alot of anesthesia to sedate him   . Urinary calculus   . Clavicular fracture     Patient Active Problem List   Diagnosis Date Noted  . Low back pain 09/26/2015  . Morbid obesity ([place]) 09/26/2015  . Abnormal urine 09/21/2015  . Anxiety about health 09/21/2015  . Hypocalcemia 09/21/2015  . Hypomagnesemia 09/21/2015  . Low testosterone 09/18/2015  . Abnormal weight gain 09/18/2015  . Urinary calculus   . Clavicular fracture   . Flank pain 08/28/2015  . Lymphocytosis 08/20/2015  . Chronic lymphocytic leukemia ([place]) 08/20/2015  . Adenopathy 07/31/2015  . Night sweats 07/31/2015  . Splenomegaly 07/31/2015    Past Surgical History  Procedure Laterality Date  . Cholecystectomy    . Anterior cruciate ligament repair    . Bone marrow biopsy  08-2015  . Biceps tendon repair Right   . Cystoscopy w/ retrogrades Bilateral 09/16/2015    Procedure: CYSTOSCOPY WITH RETROGRADE PYELOGRAM;  Surgeon: Nickie Retort, MD;  Location: [facility] ORS;  Service: Urology;  Laterality: Bilateral;     Current Outpatient Rx  Name  Route  Sig  Dispense  Refill  . amoxicillin-clavulanate (AUGMENTIN) 875-125 MG tablet   Oral   Take 1 tablet by mouth 2 (two) times daily.   14 tablet   0   . DULoxetine (CYMBALTA) 30 MG capsule      One by mouth daily for ten days, then two a day   50 capsule   0   . meloxicam (MOBIC) 15 MG tablet   Oral   Take 1 tablet (15 mg total) by mouth daily.   30 tablet   0   . Multiple Vitamin (MULTIVITAMIN) tablet   Oral   Take 1 tablet by mouth daily.         Marland Kitchen omeprazole (PRILOSEC OTC) 20 MG tablet   Oral   Take 20 mg by mouth every morning.          Marland Kitchen oxyCODONE-acetaminophen (ROXICET) 5-325 MG tablet   Oral   Take 1 tablet by mouth every 6 (six) hours as needed for severe pain.   20 tablet   0     Allergies Contrast media  Family History  Problem Relation Age of Onset  . Diabetes Paternal Uncle   . Cancer Paternal Grandmother     breast  . Diabetes Paternal Grandfather   . Heart disease Neg Hx   . Hypertension Neg Hx   . Stroke Neg Hx   . COPD Neg Hx  Social History Social History  Substance Use Topics  . Smoking status: Never Smoker   . Smokeless tobacco: Current User    Types: Snuff  . Alcohol Use: Yes     Comment: rare    Review of Systems  Constitutional: Negative for fever. Positive for chills Eyes: Negative for visual changes. ENT: Negative for sore throat Cardiovascular: Negative for chest pain. Respiratory: Negative for shortness of breath. Gastrointestinal: Negative for abdominal pain, vomiting and diarrhea. Genitourinary: Negative for dysuria. Musculoskeletal: Negative for back pain. Skin: Swelling and redness to the finger Neurological: Negative for headaches or focal weakness Psychiatric: Mild anxiety    ____________________________________________   PHYSICAL EXAM:  VITAL SIGNS: ED Triage Vitals  Enc Vitals Group     BP 09/26/15 2047 143/96 mmHg     Pulse Rate 09/26/15 2047 82      Resp 09/26/15 2047 18     Temp 09/26/15 2047 98.7 F (37.1 C)     Temp Source 09/26/15 2047 Oral     SpO2 09/26/15 2047 94 %     Weight --      Height --      Head Cir --      Peak Flow --      Pain Score 09/26/15 2058 8     Pain Loc --      Pain Edu? --      Excl. in [place]? --      Constitutional: Alert and oriented. Well appearing and in no distress. Eyes: Conjunctivae are normal.  ENT   Head: Normocephalic and atraumatic.   Mouth/Throat: Mucous membranes are moist. Cardiovascular: Normal rate, regular rhythm. Normal and symmetric distal pulses are present in all extremities. No murmurs, rubs, or gallops. Respiratory: Normal respiratory effort without tachypnea nor retractions. Breath sounds are clear and equal bilaterally.  Gastrointestinal: Soft and non-tender in all quadrants. No distention. There is no CVA tenderness. Genitourinary: deferred Musculoskeletal: Right fourth finger is swollen diffusely and erythematous and held in slight flexion. When I extend his finger he cries out in pain. He is tender all along the flexor tendon. He also reports tenderness at the base of his hand on the palmar aspect. Neurologic:  Normal speech and language. No gross focal neurologic deficits are appreciated. Skin:  Skin is otherwise warm, dry and intact.  Psychiatric: Mood and affect are normal. Patient exhibits appropriate insight and judgment.  ____________________________________________    LABS (pertinent positives/negatives)  Labs Reviewed  CBC - Abnormal; Notable for the following:    WBC 22.2 (*)    All other components within normal limits  BASIC METABOLIC PANEL - Abnormal; Notable for the following:    Glucose, Bld 114 (*)    BUN 22 (*)    All other components within normal limits    ____________________________________________   EKG  None  ____________________________________________    RADIOLOGY I have personally reviewed any xrays that were ordered on this  patient: None  ____________________________________________   PROCEDURES  Procedure(s) performed: none  Critical Care performed: yes  CRITICAL CARE Performed by: Lavonia Drafts   Total critical care time: 30 minutes  Critical care time was exclusive of separately billable procedures and treating other patients.  Critical care was necessary to treat or prevent imminent or life-threatening deterioration.  Critical care was time spent personally by me on the following activities: development of treatment plan with patient and/or surrogate as well as nursing, discussions with consultants, evaluation of patient's response to treatment, examination of patient, obtaining history  from patient or surrogate, ordering and performing treatments and interventions, ordering and review of laboratory studies, ordering and review of radiographic studies, pulse oximetry and re-evaluation of patient's condition.   ____________________________________________   INITIAL IMPRESSION / ASSESSMENT AND PLAN / ED COURSE  Pertinent labs & imaging results that were available during my care of the patient were reviewed by me and considered in my medical decision making (see chart for details).  Patient with critical exam consistent with infectious flexor tenosynovitis of the right fourth finger, he has an elevated WBC and I have concerns the infection is spreading to his hand.  I discussed the case with Dr. Roland Rack of orthopedics and he states this patient will require hand surgeon and recommends transfer.  I ordered vancomycin and Cipro IV as well as blood cultures.  Discussed with hand surgery at [facility] who recommends ED transfer. Discussed with Dr. Milagros Reap of [facility] ED accept the patient in transfer  ____________________________________________   FINAL CLINICAL IMPRESSION(S) / ED DIAGNOSES  Final diagnoses:  Flexor tenosynovitis of finger     Lavonia Drafts, MD 09/27/15 0001

## 2015-09-26 NOTE — ED Notes (Signed)
MD at bedside. 

## 2015-09-26 NOTE — Assessment & Plan Note (Signed)
See after visit summary; weight loss encouraged

## 2015-09-26 NOTE — ED Notes (Addendum)
Pt known to this nurse; pt was seen here twice Wednesday for parrot bite to 4th digit to right hand; tonight finger is more swollen; large area of dark discoloration that was not there on Wednesday; redness; pt unable to say if elevated temp at home, has not been checking; taking antibiotic as prescribed; taking percocet as prescribed for pain with minimal relief; pt was offered oral pain medications in triage but declined; says he'd rather get IV medications

## 2015-09-26 NOTE — Assessment & Plan Note (Addendum)
Patient asked for narcotics, but I am not going to treat him with narcotics; I will be glad to provide meloxicam; weight loss will be important

## 2015-09-27 DIAGNOSIS — K219 Gastro-esophageal reflux disease without esophagitis: Secondary | ICD-10-CM | POA: Insufficient documentation

## 2015-09-27 DIAGNOSIS — M659 Synovitis and tenosynovitis, unspecified: Secondary | ICD-10-CM | POA: Insufficient documentation

## 2015-09-27 DIAGNOSIS — T148XXA Other injury of unspecified body region, initial encounter: Secondary | ICD-10-CM | POA: Insufficient documentation

## 2015-09-28 ENCOUNTER — Telehealth: Payer: Self-pay | Admitting: Family Medicine

## 2015-09-28 NOTE — Telephone Encounter (Signed)
Thank you; chart reviewed He was bitten by a parrot and transferred to [facility], hand surgery

## 2015-09-28 NOTE — Telephone Encounter (Signed)
Spoke with patient and stated he is in the hospital, he has an emergency operation. He will call us back after discharge to schedule a f/u lab appointment.

## 2015-10-01 LAB — CULTURE, BLOOD (ROUTINE X 2)
CULTURE: NO GROWTH
CULTURE: NO GROWTH

## 2015-10-02 ENCOUNTER — Ambulatory Visit (INDEPENDENT_AMBULATORY_CARE_PROVIDER_SITE_OTHER): Payer: PRIVATE HEALTH INSURANCE | Admitting: Family Medicine

## 2015-10-02 ENCOUNTER — Encounter: Payer: Self-pay | Admitting: Family Medicine

## 2015-10-02 VITALS — BP 146/89 | HR 79 | Temp 98.2°F | Wt 270.0 lb

## 2015-10-02 DIAGNOSIS — W5581XA Bitten by other mammals, initial encounter: Secondary | ICD-10-CM | POA: Diagnosis not present

## 2015-10-02 DIAGNOSIS — T148 Other injury of unspecified body region: Secondary | ICD-10-CM | POA: Diagnosis not present

## 2015-10-02 DIAGNOSIS — E559 Vitamin D deficiency, unspecified: Secondary | ICD-10-CM | POA: Diagnosis not present

## 2015-10-02 DIAGNOSIS — E291 Testicular hypofunction: Secondary | ICD-10-CM

## 2015-10-02 DIAGNOSIS — F418 Other specified anxiety disorders: Secondary | ICD-10-CM

## 2015-10-02 DIAGNOSIS — R7989 Other specified abnormal findings of blood chemistry: Secondary | ICD-10-CM

## 2015-10-02 DIAGNOSIS — E669 Obesity, unspecified: Secondary | ICD-10-CM | POA: Diagnosis not present

## 2015-10-02 DIAGNOSIS — C9110 Chronic lymphocytic leukemia of B-cell type not having achieved remission: Secondary | ICD-10-CM | POA: Diagnosis not present

## 2015-10-02 DIAGNOSIS — T148XXA Other injury of unspecified body region, initial encounter: Secondary | ICD-10-CM

## 2015-10-02 MED ORDER — TESTOSTERONE CYPIONATE 100 MG/ML IM SOLN: 100.0000 mg | INTRAMUSCULAR | Status: DC

## 2015-10-02 NOTE — Progress Notes (Signed)
BP 146/89 mmHg  Pulse 79  Temp(Src) 98.2 F (36.8 C)  Wt 270 lb (122.471 kg)  SpO2 98%   Subjective:    Patient ID: Albert Sanders, male    DOB: [birth year], 49 y.o.   MRN: [id number]  HPI: Albert Sanders is a 49 y.o. male  Chief Complaint  Patient presents with  . Follow-up    discuss labs   Interim medical history since our last visit reviewed. Had surgery at [facility], parrot bite on the right 4th finger; no fevers He was bitten on the 4th finger on the right hand by a parrot; terrible pain; he showed me a picture, puncture wound just distal to DIP; this was Wednesday, then he went back late Wed night/Thursday morning and then went back on Saturday; it was "horrific looking" on Saturday; they sent him to [facility], had sugery; taking antibiotics and pain medicines; using bathroom quite well; goes back to Tuesday; he gave the parrot back to his owner; he is right-handed; no fevers now; he has leukemia; we reviewed his [facility] notes and labs, as well as [facility] notes and labs  He had "the kidney infection deal" a few weeks ago too; never really had infections before the leukemia  He is here to talk about his testosterone; he is very interested in starting replacement; we reviewed his recent lab results, 113 one month ago and 116 most recently 2 weeks ago No fam hx of DVT or PE, no personal hx of DVT or PE No pain with intercourse, no blood in semen or urine; no testicular lumps; very low drive; decreased muscle mass; no loss of hair, no testicular atrophy Vit D low at 22  Relevant past medical, surgical, family and social history reviewed and updated as indicated. Allergies and medications reviewed and updated.  Review of Systems  Per HPI unless specifically indicated above     Objective:    BP 146/89 mmHg  Pulse 79  Temp(Src) 98.2 F (36.8 C)  Wt 270 lb (122.471 kg)  SpO2 98%  Wt Readings from Last 3 Encounters:  10/02/15 270 lb (122.471 kg)  09/24/15 285 lb (129.275 kg)   09/23/15 285 lb (129.275 kg)  body mass index is 36.61 kg/(m^2).  Physical Exam  Constitutional: He appears well-developed and well-nourished.  Weight loss 15 pounds over last 2 weeks  Eyes: EOM are normal. No scleral icterus.  Cardiovascular: Normal rate.   Pulmonary/Chest: Effort normal.  Abdominal: He exhibits no distension.  Musculoskeletal:  No proximal erythema along right forearm; no epitrochlear lymphadenopathy; right hand wrapped in bandages, site of infection/surgery covered  Skin: No pallor.  Psychiatric: He has a normal mood and affect.   Results for orders placed or performed during the hospital encounter of 09/26/15  Blood culture (routine x 2)  Result Value Ref Range   Specimen Description BLOOD RIGHT ARM    Special Requests BOTTLES DRAWN AEROBIC AND ANAEROBIC 5CC    Culture NO GROWTH 5 DAYS    Report Status 10/01/2015 FINAL   Blood culture (routine x 2)  Result Value Ref Range   Specimen Description BLOOD LEFT HAND    Special Requests BOTTLES DRAWN AEROBIC AND ANAEROBIC 3CC    Culture NO GROWTH 5 DAYS    Report Status 10/01/2015 FINAL   CBC  Result Value Ref Range   WBC 22.2 (H) 3.8 - 10.6 K/uL   RBC 5.52 4.40 - 5.90 MIL/uL   Hemoglobin 14.7 13.0 - 18.0 g/dL   HCT 44.9  40.0 - 52.0 %   MCV 81.5 80.0 - 100.0 fL   MCH 26.6 26.0 - 34.0 pg   MCHC 32.7 32.0 - 36.0 g/dL   RDW 14.0 11.5 - 14.5 %   Platelets 205 150 - 440 K/uL  Basic metabolic panel  Result Value Ref Range   Sodium 136 135 - 145 mmol/L   Potassium 3.9 3.5 - 5.1 mmol/L   Chloride 105 101 - 111 mmol/L   CO2 24 22 - 32 mmol/L   Glucose, Bld 114 (H) 65 - 99 mg/dL   BUN 22 (H) 6 - 20 mg/dL   Creatinine, Ser 0.79 0.61 - 1.24 mg/dL   Calcium 9.1 8.9 - 10.3 mg/dL   GFR calc non Af Amer >60 >60 mL/min   GFR calc Af Amer >60 >60 mL/min   Anion gap 7 5 - 15      Assessment & Plan:   Problem List Items Addressed This Visit      Other   Chronic lymphocytic leukemia (HCC)    Discussed with  patient, likely affecting his recent propensity to infections      Low testosterone - Primary    Talked with patient about risks/benefits/alternatives to testosterone therapy; these include but are not limited to heart disease, alteration in lipid panel, breast enlargement, leg edema, blood clots, mood changes including depression and anger; he wishes to proceed; will have him return in 6 weeks for recheck, symptoms, labs, etc.; controlled substance speech given      Anxiety about health    Continue the SNRI; I will not prescribe benzo for this patient      Obesity    BMI now down to 36.61, changed diagnosis of morbid obesity to plain obesity      Animal bite    Recent hospitalization and surgery; outside notes reviewed; white count elevation discussed with patient; continue antibiotics and f/u with [facility] providers      Vitamin D deficiency    Take 2,000 iu vitamin D3 daily         Follow up plan: Return in about 6 weeks (around 11/13/2015).  An after-visit summary was printed and given to the patient at [facility].  Please see the patient instructions which may contain other information and recommendations beyond what is mentioned above in the assessment and plan.  Meds ordered this encounter  Medications  . doxycycline (VIBRA-TABS) 100 MG tablet    Sig: Take 100 mg by mouth daily.  Marland Kitchen testosterone cypionate (DEPO-TESTOSTERONE) 100 MG/ML injection    Sig: Inject 1 mL (100 mg total) into the muscle every 14 (fourteen) days. For IM use only    Dispense:  10 mL    Refill:  0   Face-to-face time with patient was more than 25 minutes, >50% time spent counseling and coordination of care

## 2015-10-02 NOTE — Patient Instructions (Addendum)
Vitamin D3 take 2,000 iu daily No other testosterone supplements or boosters Return in 6 weeks, for morning visit and labs

## 2015-10-06 DIAGNOSIS — E559 Vitamin D deficiency, unspecified: Secondary | ICD-10-CM | POA: Insufficient documentation

## 2015-10-06 NOTE — Assessment & Plan Note (Signed)
Take 2,000 iu vitamin D3 daily

## 2015-10-06 NOTE — Assessment & Plan Note (Signed)
Discussed with patient, likely affecting his recent propensity to infections

## 2015-10-06 NOTE — Assessment & Plan Note (Signed)
Continue the SNRI; I will not prescribe benzo for this patient

## 2015-10-06 NOTE — Assessment & Plan Note (Addendum)
Talked with patient about risks/benefits/alternatives to testosterone therapy; these include but are not limited to heart disease, alteration in lipid panel, breast enlargement, leg edema, blood clots, mood changes including depression and anger; he wishes to proceed; will have him return in 6 weeks for recheck, symptoms, labs, etc.; controlled substance speech given

## 2015-10-06 NOTE — Assessment & Plan Note (Signed)
Recent hospitalization and surgery; outside notes reviewed; white count elevation discussed with patient; continue antibiotics and f/u with [facility] providers

## 2015-10-06 NOTE — Assessment & Plan Note (Signed)
BMI now down to 36.61, changed diagnosis of morbid obesity to plain obesity

## 2015-10-27 DIAGNOSIS — S61209A Unspecified open wound of unspecified finger without damage to nail, initial encounter: Secondary | ICD-10-CM | POA: Insufficient documentation

## 2015-11-02 ENCOUNTER — Other Ambulatory Visit: Payer: Self-pay | Admitting: Family Medicine

## 2015-11-02 DIAGNOSIS — M545 Low back pain, unspecified: Secondary | ICD-10-CM

## 2015-11-02 DIAGNOSIS — F418 Other specified anxiety disorders: Secondary | ICD-10-CM

## 2015-11-02 NOTE — Telephone Encounter (Signed)
He needs a refill on Cymbalta. Also he needs a new rx for Testosterone. [facility] can only get the single dose vials and so he's having to pay a copay every time he picks one up. He wants to know if we can cancel that rx at [facility] and he can get a new rx for the 3ml and he'll take that to a pharmacy that has the 53ml vials in stock.

## 2015-11-02 NOTE — Telephone Encounter (Signed)
Pt would like to speak with ML about his testosterone.

## 2015-11-03 MED ORDER — DULOXETINE HCL 60 MG PO CPEP: 60.0000 mg | ORAL_CAPSULE | Freq: Every day | ORAL | Status: DC

## 2015-11-03 NOTE — Telephone Encounter (Signed)
He should be getting two 1 ml vials each month, and keep in mind we're doing this very short term before we draw blood again; the dose may change; he should work out with [place] [address] to get one month (4 weeks of medicine) for one co-pay I sent the other Cymbalta Rx

## 2015-11-03 NOTE — Telephone Encounter (Signed)
Left message to call.

## 2015-11-03 NOTE — Telephone Encounter (Signed)
Patient notified

## 2015-11-04 ENCOUNTER — Other Ambulatory Visit: Payer: Self-pay | Admitting: Family Medicine

## 2015-11-04 NOTE — Telephone Encounter (Signed)
RX was refilled on 11/03/15.

## 2015-11-11 ENCOUNTER — Telehealth: Payer: Self-pay | Admitting: Family Medicine

## 2015-11-11 ENCOUNTER — Ambulatory Visit: Payer: PRIVATE HEALTH INSURANCE | Admitting: Family Medicine

## 2015-11-11 NOTE — Telephone Encounter (Signed)
Due to skin grafts and hand surgries appt slipped his mind. After speaking with him he stated that he had one surgery last Wednesday and will have another one in 2 weeks and will be coming in on jan 11 to follow up. i let pt know we were here for him if he needed [place] in between time and he will call if he needs [place].

## 2015-12-09 ENCOUNTER — Ambulatory Visit: Payer: PRIVATE HEALTH INSURANCE | Admitting: Family Medicine

## 2015-12-11 ENCOUNTER — Other Ambulatory Visit: Payer: Self-pay | Admitting: Family Medicine

## 2015-12-12 NOTE — Telephone Encounter (Signed)
He no showed in December and earlier this week Ask patient to schedule and keep appt please Rx sent as requested

## 2015-12-14 ENCOUNTER — Encounter: Payer: Self-pay | Admitting: Family Medicine

## 2015-12-14 ENCOUNTER — Telehealth: Payer: Self-pay | Admitting: Family Medicine

## 2015-12-14 NOTE — Telephone Encounter (Signed)
Left voice mails on patients phone to call us back. Since he has not called I am sending out a letter. 12/15/15

## 2015-12-21 ENCOUNTER — Other Ambulatory Visit: Payer: Self-pay

## 2015-12-21 DIAGNOSIS — C9110 Chronic lymphocytic leukemia of B-cell type not having achieved remission: Secondary | ICD-10-CM

## 2015-12-23 ENCOUNTER — Ambulatory Visit: Payer: PRIVATE HEALTH INSURANCE | Admitting: Family Medicine

## 2015-12-24 ENCOUNTER — Inpatient Hospital Stay: Payer: Self-pay

## 2015-12-24 ENCOUNTER — Inpatient Hospital Stay: Payer: Self-pay | Admitting: Hematology and Oncology

## 2015-12-28 ENCOUNTER — Ambulatory Visit: Payer: PRIVATE HEALTH INSURANCE | Admitting: Family Medicine

## 2015-12-29 ENCOUNTER — Encounter: Payer: Self-pay | Admitting: Family Medicine

## 2016-01-01 ENCOUNTER — Inpatient Hospital Stay: Payer: Self-pay

## 2016-01-01 ENCOUNTER — Inpatient Hospital Stay: Payer: Self-pay | Admitting: Hematology and Oncology

## 2016-01-04 ENCOUNTER — Ambulatory Visit: Payer: PRIVATE HEALTH INSURANCE | Admitting: Family Medicine

## 2016-01-08 ENCOUNTER — Inpatient Hospital Stay: Payer: Self-pay | Attending: Hematology and Oncology

## 2016-01-08 ENCOUNTER — Inpatient Hospital Stay: Payer: Self-pay

## 2016-02-04 ENCOUNTER — Other Ambulatory Visit: Payer: Self-pay

## 2016-02-04 ENCOUNTER — Ambulatory Visit: Admission: RE | Admit: 2016-02-04 | Payer: Self-pay | Source: Ambulatory Visit

## 2016-02-04 ENCOUNTER — Ambulatory Visit: Payer: Self-pay | Admitting: Hematology and Oncology

## 2016-03-03 ENCOUNTER — Ambulatory Visit: Payer: Self-pay

## 2016-03-10 ENCOUNTER — Other Ambulatory Visit: Payer: Self-pay

## 2016-03-10 ENCOUNTER — Ambulatory Visit: Payer: Self-pay | Admitting: Hematology and Oncology

## 2016-03-30 ENCOUNTER — Telehealth: Payer: Self-pay

## 2016-03-30 NOTE — Telephone Encounter (Signed)
Returned pt's phone regarding pain in RUQ.  Pt stated he was a little upset that he had not heard back from [place].  I apologized to pt and he stated don't worry Albert Sanders.  He explained their was a ball on the ground he bent over for telling himself "fat ass" pick up the ball and when he did he felt a stabbing pain and a blacking out sensation after the pain. He called the on call and stated that they told him someone would be back in touch with him today.  Per Dr. Mike Gip he should report to the ER if pain is there and we see him for CLL that she does not believe is associated with the pain.  I reminded him he missed his last appt and has another coming up on the 17th.  Per pt he couldn't come related to insurance and finanances and would call and reschedule.  Pt verbalizes he has scheduling #.  I explained to pt to please report to ER when he has symptoms he cannot or feels he cannot handle or are severe in nature instead of waiting on office to open.  Pt reported oh this pain I've had before.  No other concerns noted.  Pt reported pain is now better and will go to the ER if needed.

## 2016-04-06 ENCOUNTER — Ambulatory Visit: Payer: Self-pay

## 2016-04-12 ENCOUNTER — Inpatient Hospital Stay: Payer: Self-pay | Admitting: Hematology and Oncology

## 2016-04-12 ENCOUNTER — Inpatient Hospital Stay: Payer: Self-pay

## 2016-04-27 ENCOUNTER — Ambulatory Visit: Admission: RE | Admit: 2016-04-27 | Payer: Self-pay | Source: Ambulatory Visit

## 2016-05-09 ENCOUNTER — Inpatient Hospital Stay: Payer: Self-pay

## 2016-05-09 ENCOUNTER — Inpatient Hospital Stay: Payer: Self-pay | Admitting: Hematology and Oncology

## 2016-06-17 ENCOUNTER — Inpatient Hospital Stay: Payer: Self-pay

## 2016-06-17 ENCOUNTER — Inpatient Hospital Stay: Payer: Self-pay | Admitting: Hematology and Oncology

## 2016-06-19 ENCOUNTER — Encounter: Payer: Self-pay | Admitting: *Deleted

## 2016-08-02 ENCOUNTER — Telehealth: Payer: Self-pay | Admitting: Urology

## 2016-08-02 NOTE — Telephone Encounter (Signed)
Called patient to schedule his 1 year follow up appt and he said that he did not want to schedule one at this time.   Sharyn Lull

## 2016-09-23 ENCOUNTER — Inpatient Hospital Stay: Payer: Self-pay | Admitting: Hematology and Oncology

## 2016-09-23 ENCOUNTER — Inpatient Hospital Stay: Payer: Self-pay

## 2016-09-23 NOTE — Progress Notes (Deleted)
[facility] day:  09/23/16  Chief Complaint: Albert Sanders is a 50 y.o. male with chronic lymphocytic leukemia who is seen for reassessment.  HPI: The patient was last seen in the medical oncology clinic on 09/18/2015.  At that time,   He was lost to follow-up.  peripheral blood flow cytometry was reviewed.  Pathology revealed a CD5 positive/CD23 positive clonal B-cell population with a CLL/SLL phenotype in 27% of leukocytes, which were CD38 negative.  There was a gamma-delta T-cell lymphocytosis representing 2.2% of lymphocytes.  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  We discussed a diagnosis of CLL, not requring treatment.  Patient opted for a bone marrow.  Bone marrow on 09/04/2015 revealed <10% involvement with B-cell lymphoproliferative neoplasm with a B-cell CLL/SLL immunophenotype.  There was mild non-specific dyserythropoiesis and mild increase in reticulin.  Storage iron was present. Flow cytometry revealed CD5+ monotypic (clonal) B cell population (20% of sample) with B-cell CLL/SLL immunophenotype.  Cytogenetics were normal (46, XY).  FISH studies are pending.  Regarding his back and left side pain, he was seen by urology.  He underwent cystoscopy with retrograde pyelogram on 09/16/2015.  There were no abnormalities.  Testosterone level was 113 [phone]) on 08/28/2015.    Symptomatically, he notes back discomfort which radiates to his buttock and leg.  He notes chronic back issues.  He denies any lifting or trauma.  He denies any lower extremity numbness or weakness.  He has gained 20 pounds.  He is eating a lot at night (pasta, cookies, milk).  Past Medical History:  Diagnosis Date  . Cancer ([place])    leukemia  . Clavicular fracture   .  Complication of anesthesia    woke up during gallbladder surgery-pt states it takes alot of anesthesia to sedate him   . GERD (gastroesophageal reflux disease)   . Kidney stone   . Urinary calculus     Past Surgical History:  Procedure Laterality Date  . ANTERIOR CRUCIATE LIGAMENT REPAIR    . BICEPS TENDON REPAIR Right   . BONE MARROW BIOPSY  08-2015  . CHOLECYSTECTOMY    . CYSTOSCOPY W/ RETROGRADES Bilateral 09/16/2015   Procedure: CYSTOSCOPY WITH RETROGRADE PYELOGRAM;  Surgeon: Nickie Retort, MD;  Location: [facility] ORS;  Service: Urology;  Laterality: Bilateral;    Family History  Problem Relation Age of Onset  . Diabetes Paternal Uncle   . Cancer Paternal Grandmother     breast  . Diabetes Paternal Grandfather   . Heart disease Neg Hx   . Hypertension Neg Hx   . Stroke Neg Hx   . COPD Neg Hx     Social History:  reports that he has never smoked. His smokeless tobacco use includes Snuff. He reports that he drinks alcohol. He reports that he does not use drugs.  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 He dips 1 can a day. He rarely drinks alcohol. 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   Allergies:  Allergies  Allergen Reactions  . Contrast Media [Iodinated Diagnostic Agents] Anaphylaxis    Current Medications: Current Outpatient Prescriptions  Medication Sig Dispense Refill  . amoxicillin-clavulanate (AUGMENTIN) 875-125 MG tablet Take 1 tablet by mouth 2 (two) times daily. 14 tablet 0  . DULoxetine (  CYMBALTA) 60 MG capsule Take 1 capsule (60 mg total) by mouth daily. 30 capsule 0  . Multiple Vitamin (MULTIVITAMIN) tablet Take 1 tablet by mouth daily.    Marland Kitchen omeprazole (PRILOSEC OTC) 20 MG tablet Take 20 mg by mouth every morning.     Marland Kitchen oxyCODONE-acetaminophen (ROXICET) 5-325 MG tablet Take 1 tablet by mouth every 6 (six) hours as needed for severe pain. 20 tablet 0  . testosterone cypionate (DEPO-TESTOSTERONE) 100 MG/ML injection Inject 1 mL (100 mg total)  into the muscle every 14 (fourteen) days. For IM use only 10 mL 0   No current facility-administered medications for this visit.     Review of Systems:  GENERAL:  General mild fatigue.  No fever.  Baseline "prolific sweater".  Weight gain of 20 pound since 08/28/2015. PERFORMANCE STATUS (ECOG):  0 HEENT:  No runny nose, mouth sores or tenderness. Lungs: No shortness of breath or cough.  No hemoptysis. Cardiac:  Chest pain with exertion from "time to time".  No palpitations, orthopnea, or PND. GI:  No nausea, vomiting, diarrhea, melena or hematochezia. GU:  Low testosterone.  No urgency, frequency, dysuria, or hematuria. Musculoskeletal:  Chronic back issues.  No joint pain.  No muscle tenderness. Extremities:  No pain or swelling. Skin:  No rashes or skin changes. Neuro:  No headache, numbness or weakness, balance or coordination issues. Endocrine:  No diabetes, thyroid issues, hot flashes or night sweats. Psych:  No mood changes, depression or anxiety. Pain:  No focal pain. Review of systems:  All other systems reviewed and found to be negative.  Physical Exam: There were no vitals taken for this visit. GENERAL:  Well developed, well nourished, heavy set gentleman sitting comfortably in the exam room in no acute distress. MENTAL STATUS:  Alert and oriented to person, place and time. HEAD:  Short blonde crew cut hair.  Normocephalic, atraumatic, face symmetric, no Cushingoid features. EYES:  Blue eyes.  Pupils equal round and reactive to light and accomodation.  No conjunctivitis or scleral icterus. ENT:  Oropharynx clear without lesion.  Tongue normal. Mucous membranes moist.  RESPIRATORY:  Clear to auscultation without rales, wheezes or rhonchi. CARDIOVASCULAR:  Regular rate and rhythm without murmur, rub or gallop. ABDOMEN:  Fully round.  Soft, non-tender, with active bowel sounds, and no hepatomegaly.  Spleen tip palpable.  No masses. BACK:  Bone marrow site unremarkable. SKIN:   No rashes, ulcers or lesions. EXTREMITIES: No edema, no skin discoloration or tenderness.  No palpable cords. LYMPH NODES: No palpable cervical, supraclavicular, axillary or inguinal adenopathy  NEUROLOGICAL: Unremarkable. PSYCH:  Appropriate.   No visits with results within 3 Day(s) from this visit.  Latest known visit with results is:  Admission on 09/26/2015, Discharged on 09/27/2015  Component Date Value Ref Range Status  . WBC 09/26/2015 22.2* 3.8 - 10.6 K/uL Final  . RBC 09/26/2015 5.52  4.40 - 5.90 MIL/uL Final  . Hemoglobin 09/26/2015 14.7  13.0 - 18.0 g/dL Final  . HCT 09/26/2015 44.9  40.0 - 52.0 % Final  . MCV 09/26/2015 81.5  80.0 - 100.0 fL Final  . MCH 09/26/2015 26.6  26.0 - 34.0 pg Final  . MCHC 09/26/2015 32.7  32.0 - 36.0 g/dL Final  . RDW 09/26/2015 14.0  11.5 - 14.5 % Final  . Platelets 09/26/2015 205  150 - 440 K/uL Final  . Sodium 09/26/2015 136  135 - 145 mmol/L Final  . Potassium 09/26/2015 3.9  3.5 - 5.1 mmol/L Final  .  Chloride 09/26/2015 105  101 - 111 mmol/L Final  . CO2 09/26/2015 24  22 - 32 mmol/L Final  . Glucose, Bld 09/26/2015 114* 65 - 99 mg/dL Final  . BUN 09/26/2015 22* 6 - 20 mg/dL Final  . Creatinine, Ser 09/26/2015 0.79  0.61 - 1.24 mg/dL Final  . Calcium 09/26/2015 9.1  8.9 - 10.3 mg/dL Final  . GFR calc non Af Amer 09/26/2015 >60  >60 mL/min Final  . GFR calc Af Amer 09/26/2015 >60  >60 mL/min Final   Comment: (NOTE) The eGFR has been calculated using the CKD EPI equation. This calculation has not been validated in all clinical situations. eGFR's persistently <60 mL/min signify possible Chronic Kidney Disease.   . Anion gap 09/26/2015 7  5 - 15 Final  . Specimen Description 10/01/2015 BLOOD RIGHT ARM   Final  . Special Requests 10/01/2015 BOTTLES DRAWN AEROBIC AND ANAEROBIC 5CC   Final  . Culture 10/01/2015 NO GROWTH 5 DAYS   Final  . Report Status 10/01/2015 10/01/2015 FINAL   Final  . Specimen Description 10/01/2015 BLOOD LEFT HAND    Final  . Special Requests 10/01/2015 BOTTLES DRAWN AEROBIC AND ANAEROBIC 3CC   Final  . Culture 10/01/2015 NO GROWTH 5 DAYS   Final  . Report Status 10/01/2015 10/01/2015 FINAL   Final    Assessment:  Albert Sanders is a 50 y.o. 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  He was noted to have abdominal adenopathy and mild splenomegaly on CT scan in 02/2015 following a fall.  He represented with abdominal pain, fever, and sweats.  Abdominal and pelvic CT scan without contrast on 07/25/2015 revealed abdominal adenopathy with the largest lymph node in the portacaval region measuring 1.9 cm (stable).  There was mild splenomegaly (15 cm).    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  Labs on 08/07/2015 revealed a hematocrit of 43, hemoglobin 13.9, platelets 200,000, white count 13,000 with an ANC of 500. Differential included 50% segs and 42% lymphs with a mild lymphocytosis (5500).  Comprehensive metabolic, LDH, uric acid, sedimentation rate, SPEP, and immunoglobulins were normal.  PET scan on 08/07/2015 revealed no hypermetabolic lymphadenopathy or metastatic disease. There was a mildly enlarged gastrohepatic ligament, portacaval, and right external iliac lymph nodes (1.3-1.9 cm).  He had a top normal spleen size.   Bone marrow on 09/04/2015 revealed <10% involvement with B-cell lymphoproliferative neoplasm with a B-cell CLL/SLL immunophenotype.  There was mild non-specific dyserythropoiesis and mild increase in reticulin.  Storage iron was present. Flow cytometry revealed CD5+ monotypic (clonal) B cell population (20% of sample) with  B-cell CLL/SLL immunophenotype.  Cytogenetics were normal (46, XY).  FISH studies are pending.  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  Cystoscopy with retrograde pyelogram on 09/16/2015 revealed no abnormalities.  Testosterone level was 113 [phone]) on 08/28/2015.   Symptomatically, he has chronic mild fatigued.  He has no B symptoms.  He is gaining a significant amount of weight (20 pounds in 3 weeks).  He has a low testosterone level.  Plan: 1.  Review bone marrow and cystocopy. 2.  Discuss plan for observation regarding CLL. 3.  Follow-up pending FISH studies. 4.  Discuss concern regarding diet and excess weight gain. 5.  Follow-up with PCP regarding low testosterone and weight. 6.  RTC in 3 months for MD assessment and labs (CBC with diff, BMP, LDH, uric acid).   Lequita Asal, MD  09/23/2016, 6:36 AM

## 2016-09-29 ENCOUNTER — Telehealth: Payer: Self-pay | Admitting: *Deleted

## 2016-09-29 ENCOUNTER — Inpatient Hospital Stay (HOSPITAL_BASED_OUTPATIENT_CLINIC_OR_DEPARTMENT_OTHER): Payer: BLUE CROSS/BLUE SHIELD | Admitting: Hematology and Oncology

## 2016-09-29 ENCOUNTER — Inpatient Hospital Stay: Payer: BLUE CROSS/BLUE SHIELD | Attending: Hematology and Oncology

## 2016-09-29 VITALS — BP 126/81 | HR 85 | Temp 98.4°F | Resp 18 | Wt 279.8 lb

## 2016-09-29 DIAGNOSIS — C9110 Chronic lymphocytic leukemia of B-cell type not having achieved remission: Secondary | ICD-10-CM | POA: Diagnosis not present

## 2016-09-29 DIAGNOSIS — Z79899 Other long term (current) drug therapy: Secondary | ICD-10-CM

## 2016-09-29 DIAGNOSIS — R161 Splenomegaly, not elsewhere classified: Secondary | ICD-10-CM

## 2016-09-29 DIAGNOSIS — K219 Gastro-esophageal reflux disease without esophagitis: Secondary | ICD-10-CM | POA: Diagnosis not present

## 2016-09-29 LAB — CBC WITH DIFFERENTIAL/PLATELET
Basophils Absolute: 0 10*3/uL (ref 0–0.1)
Basophils Relative: 0 %
Eosinophils Absolute: 0.1 10*3/uL (ref 0–0.7)
Eosinophils Relative: 1 %
HCT: 43.4 % (ref 40.0–52.0)
Hemoglobin: 14.2 g/dL (ref 13.0–18.0)
Lymphocytes Relative: 37 %
Lymphs Abs: 4.8 10*3/uL — ABNORMAL HIGH (ref 1.0–3.6)
MCH: 26.9 pg (ref 26.0–34.0)
MCHC: 32.7 g/dL (ref 32.0–36.0)
MCV: 82 fL (ref 80.0–100.0)
Monocytes Absolute: 1.2 10*3/uL — ABNORMAL HIGH (ref 0.2–1.0)
Monocytes Relative: 10 %
Neutro Abs: 6.7 10*3/uL — ABNORMAL HIGH (ref 1.4–6.5)
Neutrophils Relative %: 52 %
Platelets: 185 10*3/uL (ref 150–440)
RBC: 5.3 MIL/uL (ref 4.40–5.90)
RDW: 14 % (ref 11.5–14.5)
WBC: 12.9 10*3/uL — ABNORMAL HIGH (ref 3.8–10.6)

## 2016-09-29 LAB — BASIC METABOLIC PANEL
Anion gap: 9 (ref 5–15)
BUN: 26 mg/dL — ABNORMAL HIGH (ref 6–20)
CO2: 25 mmol/L (ref 22–32)
Calcium: 8.9 mg/dL (ref 8.9–10.3)
Chloride: 101 mmol/L (ref 101–111)
Creatinine, Ser: 1.29 mg/dL — ABNORMAL HIGH (ref 0.61–1.24)
GFR calc Af Amer: >60 mL/min (ref >60)
GFR calc non Af Amer: >60 mL/min (ref >60)
Glucose, Bld: 124 mg/dL — ABNORMAL HIGH (ref 65–99)
Potassium: 3.7 mmol/L (ref 3.5–5.1)
Sodium: 135 mmol/L (ref 135–145)

## 2016-09-29 LAB — URIC ACID: Uric Acid, Serum: 6.7 mg/dL (ref 4.4–7.6)

## 2016-09-29 LAB — LACTATE DEHYDROGENASE: LDH: 166 U/L (ref 98–192)

## 2016-09-29 NOTE — Progress Notes (Signed)
[facility] day:  09/29/16  Chief Complaint: Albert Sanders is a 50 y.o. male with chronic lymphocytic leukemia who is seen for reassessment.  HPI: The patient was last seen in the medical oncology clinic on 09/18/2015.  At that time,  he had chronic mild fatigued.  He had no B symptoms.  He was gaining a significant amount of weight (20 pounds in 3 weeks).  He had a low testosterone level.  Exam revealed no adenopathy or hepatosplenomegaly.  He was lost to follow-up.  He states that his insurance ran out.  He reports a bite to the distal right fourth finger by a parrot on 09/22/2015.  He was seen in the [facility] emergency room and prescribed antibiotics and pain medications. He represented a few days later with severe pain and a black finger. He was transported to a [facility] where his wound was debrided. He eventually obtained a skin graft. He has had no other infections.   Symptomatically, he notes sweats "from time to time". He had 2 episodes last week. Sometimes he feels cold.  He feels tired. He states that his testosterone is low (114). He denies any adenopathy, bruising or bleeding.  He had a knot on his right wrist recently. He use one of his testosterone needles to his express the pus from this area.  It is healing well.   Past Medical History:  Diagnosis Date  . Cancer ([place])    leukemia  . Clavicular fracture   . Complication of anesthesia    woke up during gallbladder surgery-pt states it takes alot of anesthesia to sedate him   . GERD (gastroesophageal reflux disease)   . Kidney stone   . Urinary calculus     Past Surgical History:  Procedure Laterality Date  . ANTERIOR CRUCIATE LIGAMENT REPAIR    . BICEPS TENDON REPAIR Right   . BONE MARROW BIOPSY  08-2015  . CHOLECYSTECTOMY    . CYSTOSCOPY W/ RETROGRADES Bilateral 09/16/2015   Procedure: CYSTOSCOPY WITH RETROGRADE PYELOGRAM;  Surgeon: Nickie Retort, MD;  Location: [facility] ORS;   Service: Urology;  Laterality: Bilateral;    Family History  Problem Relation Age of Onset  . Diabetes Paternal Uncle   . Cancer Paternal Grandmother     breast  . Diabetes Paternal Grandfather   . Heart disease Neg Hx   . Hypertension Neg Hx   . Stroke Neg Hx   . COPD Neg Hx     Social History:  reports that he has never smoked. His smokeless tobacco use includes Snuff. He reports that he drinks alcohol. He reports that he does not use drugs.  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 He dips 1 can a day. He rarely drinks alcohol. 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   Allergies:  Allergies  Allergen Reactions  . Contrast Media [Iodinated Diagnostic Agents] Anaphylaxis  . Morphine Itching    Current Medications: Current Outpatient Prescriptions  Medication Sig Dispense Refill  . omeprazole (PRILOSEC OTC) 20 MG tablet Take 20 mg by mouth every morning.     . testosterone cypionate (DEPO-TESTOSTERONE) 100 MG/ML injection Inject 1 mL (100 mg total) into the muscle every 14 (fourteen) days. For IM use only (Patient not taking: Reported on 09/29/2016) 10 mL 0   No current facility-administered medications for this visit.     Review of Systems:  GENERAL:  General fatigue.  No fever.  Off and on  sweats.  Weight down 4 pounds since 08/01/2015. PERFORMANCE STATUS (ECOG):  0 HEENT:  No runny nose, mouth sores or tenderness. Lungs: No shortness of breath or cough.  No hemoptysis. Cardiac:  No chest pain, palpitations, orthopnea, or PND. GI:  No nausea, vomiting, diarrhea, melena or hematochezia. GU:  Low testosterone.  No urgency, frequency, dysuria, or hematuria. Musculoskeletal:  Chronic back issues.  No joint pain.  No muscle tenderness. Extremities:  No pain or swelling.  Interval parrot bite and infection (see HPI). Skin:  No rashes or skin changes. Neuro:  No headache, numbness or weakness, balance or coordination issues. Endocrine:  No diabetes, thyroid issues, hot  flashes or night sweats. Psych:  No mood changes, depression or anxiety. Pain:  No focal pain. Review of systems:  All other systems reviewed and found to be negative.  Physical Exam: Blood pressure 126/81, pulse 85, temperature 98.4 F (36.9 C), temperature source Tympanic, resp. rate 18, weight 279 lb 12.2 oz (126.9 kg). GENERAL:  Well developed, well nourished, heavy set gentleman sitting comfortably in the exam room in no acute distress. MENTAL STATUS:  Alert and oriented to person, place and time. HEAD:  Wearing a Alvan Fusion cap.  Crew cut/shaved hair.  Normocephalic, atraumatic, face symmetric, no Cushingoid features. EYES:  Blue eyes.  Pupils equal round and reactive to light and accomodation.  No conjunctivitis or scleral icterus. ENT:  Oropharynx clear without lesion.  Tongue normal. Mucous membranes moist.  RESPIRATORY:  Clear to auscultation without rales, wheezes or rhonchi. CARDIOVASCULAR:  Regular rate and rhythm without murmur, rub or gallop. ABDOMEN:  Fully round.  Soft, non-tender, with active bowel sounds, and no appreciable hepatosplenomegaly.  No masses. SKIN:  No rashes, ulcers or lesions. EXTREMITIES: Dorsal surface of right wist with deep cavitary lesion (dry).  No edema, no skin discoloration or tenderness.  No palpable cords. LYMPH NODES: No palpable cervical, supraclavicular, axillary or inguinal adenopathy  NEUROLOGICAL: Unremarkable. PSYCH:  Appropriate.    Appointment on 09/29/2016  Component Date Value Ref Range Status  . WBC 09/29/2016 12.9* 3.8 - 10.6 K/uL Final  . RBC 09/29/2016 5.30  4.40 - 5.90 MIL/uL Final  . Hemoglobin 09/29/2016 14.2  13.0 - 18.0 g/dL Final  . HCT 09/29/2016 43.4  40.0 - 52.0 % Final  . MCV 09/29/2016 82.0  80.0 - 100.0 fL Final  . MCH 09/29/2016 26.9  26.0 - 34.0 pg Final  . MCHC 09/29/2016 32.7  32.0 - 36.0 g/dL Final  . RDW 09/29/2016 14.0  11.5 - 14.5 % Final  . Platelets 09/29/2016 PENDING  150 - 400 K/uL Incomplete  .  Neutrophils Relative % 09/29/2016 52  % Final  . Neutro Abs 09/29/2016 6.7* 1.4 - 6.5 K/uL Final  . Lymphocytes Relative 09/29/2016 37  % Final  . Lymphs Abs 09/29/2016 4.8* 1.0 - 3.6 K/uL Final  . Monocytes Relative 09/29/2016 10  % Final  . Monocytes Absolute 09/29/2016 1.2* 0.2 - 1.0 K/uL Final  . Eosinophils Relative 09/29/2016 1  % Final  . Eosinophils Absolute 09/29/2016 0.1  0 - 0.7 K/uL Final  . Basophils Relative 09/29/2016 0  % Final  . Basophils Absolute 09/29/2016 0.0  0 - 0.1 K/uL Final  . Sodium 09/29/2016 135  135 - 145 mmol/L Final  . Potassium 09/29/2016 3.7  3.5 - 5.1 mmol/L Final  . Chloride 09/29/2016 101  101 - 111 mmol/L Final  . CO2 09/29/2016 25  22 - 32 mmol/L Final  . Glucose, Bld  09/29/2016 124* 65 - 99 mg/dL Final  . BUN 09/29/2016 26* 6 - 20 mg/dL Final  . Creatinine, Ser 09/29/2016 1.29* 0.61 - 1.24 mg/dL Final  . Calcium 09/29/2016 8.9  8.9 - 10.3 mg/dL Final  . GFR calc non Af Amer 09/29/2016 >60  >60 mL/min Final  . GFR calc Af Amer 09/29/2016 >60  >60 mL/min Final   Comment: (NOTE) The eGFR has been calculated using the CKD EPI equation. This calculation has not been validated in all clinical situations. eGFR's persistently <60 mL/min signify possible Chronic Kidney Disease.   . Anion gap 09/29/2016 9  5 - 15 Final  . LDH 09/29/2016 166  98 - 192 U/L Final  . Uric Acid, Serum 09/29/2016 6.7  4.4 - 7.6 mg/dL Final    Assessment:  Albert Sanders is a 50 y.o. 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  He was noted to have abdominal adenopathy and mild splenomegaly on CT scan in 02/2015 following a fall.  He represented with abdominal pain, fever, and sweats.  Abdominal and pelvic CT scan without contrast on 07/25/2015 revealed abdominal adenopathy with the largest lymph node in the portacaval region measuring 1.9 cm (stable).  There was mild splenomegaly (15 cm).    Flow cytometry on 08/20/2015 revealed a CD5 positive/CD23 positive clonal  B-cell population with a CLL/SLL phenotype in 27% of leukocytes, which were CD38 negative.  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  Labs on 08/07/2015 revealed a hematocrit of 43, hemoglobin 13.9, platelets 200,000, white count 13,000 with an ANC of 500. Differential included 50% segs and 42% lymphs with a mild lymphocytosis (5500).  Comprehensive metabolic, LDH, uric acid, sedimentation rate, SPEP, and immunoglobulins were normal.  PET scan on 08/07/2015 revealed no hypermetabolic lymphadenopathy or metastatic disease. There was a mildly enlarged gastrohepatic ligament, portacaval, and right external iliac lymph nodes (1.3-1.9 cm).  He had a top normal spleen size.   Bone marrow on 09/04/2015 revealed <10% involvement with B-cell lymphoproliferative neoplasm with a B-cell CLL/SLL immunophenotype.  There was mild non-specific dyserythropoiesis and mild increase in reticulin.  Storage iron was present. Flow cytometry revealed CD5+ monotypic (clonal) B cell population (20% of sample) with B-cell CLL/SLL immunophenotype.  Cytogenetics were normal (46, XY).  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  He has had infections.  He had a severe flexor tenosynovitis of the right 4th finger in 08/2015 after being bitten by a parrot.  He had debridement, antibiotics, and a skin graft.  He had an infection overlying his right wrist recently (unroofed by patient).  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  Cystoscopy with retrograde pyelogram on 09/16/2015 revealed no abnormalities.  Testosterone was 113 [phone]) on 08/28/2015.    Symptomatically, he has chronic fatigue and intermittent sweats.  He has a low testosterone level.  He has no adenopathy or hepatosplenomegaly.  Plan: 1.  Labs today:  CBC  with diff, BMP, LDH, uric acid. 2.  Discuss interim events and infections.  Discuss indications for treatment. 3.  Review FISH studies (P53 mutation). 4.  Follow-up with PCP regarding low testosterone and fatigue. 5. RTC in 3-4 months for MD assessment and labs (CBC with diff, CMP, LDH, uric acid, immunoglobulin levels).   Lequita Asal, MD  09/29/2016, 5:16 PM

## 2016-09-30 ENCOUNTER — Telehealth: Payer: Self-pay | Admitting: Family Medicine

## 2016-09-30 NOTE — Telephone Encounter (Signed)
I was sent a copy of abnormal labs from patient's heme-onc doctor However, I have not seen Mr. Albert Sanders since October 02, 2015 at [facility] --------------------------------- Please call the patient; find out if he is staying at [facility] and if so, he needs to contact them to make an appt for abnormal labs If he's going to see me, he needs an appt to go over his abnormal labs Thank you

## 2016-09-30 NOTE — Telephone Encounter (Signed)
Pt does plan on coming here for his care and he did schedule an appt

## 2016-10-01 ENCOUNTER — Encounter: Payer: Self-pay | Admitting: Hematology and Oncology

## 2016-10-03 ENCOUNTER — Other Ambulatory Visit: Payer: Self-pay | Admitting: *Deleted

## 2016-10-03 DIAGNOSIS — C9110 Chronic lymphocytic leukemia of B-cell type not having achieved remission: Secondary | ICD-10-CM

## 2016-10-06 ENCOUNTER — Encounter: Payer: Self-pay | Admitting: Family Medicine

## 2016-10-06 ENCOUNTER — Ambulatory Visit (INDEPENDENT_AMBULATORY_CARE_PROVIDER_SITE_OTHER): Payer: PRIVATE HEALTH INSURANCE | Admitting: Family Medicine

## 2016-10-06 VITALS — BP 120/62 | HR 93 | Temp 98.3°F | Resp 16 | Wt 281.0 lb

## 2016-10-06 DIAGNOSIS — Z6838 Body mass index (BMI) 38.0-38.9, adult: Secondary | ICD-10-CM

## 2016-10-06 DIAGNOSIS — E6609 Other obesity due to excess calories: Secondary | ICD-10-CM

## 2016-10-06 DIAGNOSIS — C9110 Chronic lymphocytic leukemia of B-cell type not having achieved remission: Secondary | ICD-10-CM

## 2016-10-06 DIAGNOSIS — E349 Endocrine disorder, unspecified: Secondary | ICD-10-CM | POA: Diagnosis not present

## 2016-10-06 DIAGNOSIS — E559 Vitamin D deficiency, unspecified: Secondary | ICD-10-CM

## 2016-10-06 DIAGNOSIS — R739 Hyperglycemia, unspecified: Secondary | ICD-10-CM | POA: Diagnosis not present

## 2016-10-06 DIAGNOSIS — Z23 Encounter for immunization: Secondary | ICD-10-CM | POA: Diagnosis not present

## 2016-10-06 DIAGNOSIS — N529 Male erectile dysfunction, unspecified: Secondary | ICD-10-CM

## 2016-10-06 DIAGNOSIS — L02419 Cutaneous abscess of limb, unspecified: Secondary | ICD-10-CM

## 2016-10-06 DIAGNOSIS — R5383 Other fatigue: Secondary | ICD-10-CM

## 2016-10-06 DIAGNOSIS — N182 Chronic kidney disease, stage 2 (mild): Secondary | ICD-10-CM | POA: Diagnosis not present

## 2016-10-06 DIAGNOSIS — T22231A Burn of second degree of right upper arm, initial encounter: Secondary | ICD-10-CM

## 2016-10-06 DIAGNOSIS — R0683 Snoring: Secondary | ICD-10-CM

## 2016-10-06 DIAGNOSIS — R7989 Other specified abnormal findings of blood chemistry: Secondary | ICD-10-CM

## 2016-10-06 DIAGNOSIS — Z125 Encounter for screening for malignant neoplasm of prostate: Secondary | ICD-10-CM

## 2016-10-06 MED ORDER — SILDENAFIL CITRATE 20 MG PO TABS: 40.0000 mg | ORAL_TABLET | Freq: Every day | ORAL | 2 refills | Status: DC | PRN

## 2016-10-06 MED ORDER — OMEPRAZOLE MAGNESIUM 20 MG PO TBEC: 20.0000 mg | DELAYED_RELEASE_TABLET | ORAL | 1 refills | Status: DC

## 2016-10-06 NOTE — Patient Instructions (Addendum)
Check out the information at familydoctor.org entitled "Nutrition for Weight Loss: What You Need to Know about Fad Diets" Try to lose between 1-2 pounds per week by taking in fewer calories and burning off more calories You can succeed by limiting portions, limiting foods dense in calories and fat, becoming more active, and drinking 8 glasses of water a day (64 ounces) Don't skip meals, especially breakfast, as skipping meals may alter your metabolism Do not use over-the-counter weight loss pills or gimmicks that claim rapid weight loss A healthy BMI (or body mass index) is between 18.5 and 24.9 You can calculate your ideal BMI at the NIH website ClubMonetize.fr  Try to limit saturated fats in your diet (bologna, hot dogs, barbeque, cheeseburgers, hamburgers, steak, bacon, sausage, cheese, etc.) and get more fresh fruits, vegetables, and whole grains  Return to the office tomorrow for fasting labs (nothing to eat after midnight, feel free to hydrate with calorie-free beverages)  Try turmeric as a natural anti-inflammatory (for pain and arthritis). It comes in capsules where you buy aspirin and fish oil, but also as a spice where you buy pepper and garlic powder.  Let me know if you'd like to see an orthopaedist  You received the flu shot today; it should protect you against the flu virus over the coming months; it will take about two weeks for antibodies to develop; do try to stay away from hospitals, nursing homes, and daycares during peak flu season; taking extra vitamin C daily during flu season may help you avoid getting sick

## 2016-10-06 NOTE — Progress Notes (Signed)
BP 120/62   Pulse 93   Temp 98.3 F (36.8 C) (Oral)   Resp 16   Wt 281 lb (127.5 kg)   SpO2 97%   BMI 38.11 kg/m    Subjective:    Patient ID: Albert Sanders, male    DOB: 1966-02-19, 50 y.o.   MRN: [id number]  HPI: Albert Sanders is a 50 y.o. male  Chief Complaint  Patient presents with  . Follow-up   He saw Dr. Mike Gip last week for cancer; pt says the cancer is not coming back Really tired, going to bed at 7:30 or 8:00 pm Energy level is really down He can sleep most of the day on Saturdays More urination Not parched mouth We reviewed his recent  Creatinine had bumped up Has had kidney stones in the past, been a while since last visit No urinating as much during the day Not losing weight No known thyroid in the family Good strong urine stream Low libido, decrease tumescence, loses erection Wife says he snores Worse reflux lately, linked to eating late, sure it's not heart, linked to acid He thinks he is getting lactose intolerance Family hx of diabetes: PGF, Puncle, no first degree relatives  He is having knee pain; not taking any percocets any more Used to take for really pain days On a scale from 0 to 10, never below a 3; can go up to a 7 or 8 Today it has been terrible even after taking ibuprofen (Advil) per package directions and tylenol He has not seen an orthopaedist lately Very active job  Dorsum of RIGHT wrist, had a large boil; drained it with one of his testosterone needles, got a large amount of pus out; much better now ; burned right bicep with hot towel  Depression screen PHQ 2/9 10/06/2016  Decreased Interest 0  Down, Depressed, Hopeless 0  PHQ - 2 Score 0   Relevant past medical, surgical, family and social history reviewed Past Medical History:  Diagnosis Date  . Cancer ([place])    leukemia  . Clavicular fracture   . Complication of anesthesia    woke up during gallbladder surgery-pt states it takes alot of anesthesia to sedate him     . GERD (gastroesophageal reflux disease)   . Kidney stone   . Urinary calculus    Past Surgical History:  Procedure Laterality Date  . ANTERIOR CRUCIATE LIGAMENT REPAIR    . BICEPS TENDON REPAIR Right   . BONE MARROW BIOPSY  08-2015  . CHOLECYSTECTOMY    . CYSTOSCOPY W/ RETROGRADES Bilateral 09/16/2015   Procedure: CYSTOSCOPY WITH RETROGRADE PYELOGRAM;  Surgeon: Nickie Retort, MD;  Location: [facility] ORS;  Service: Urology;  Laterality: Bilateral;   Family History  Problem Relation Age of Onset  . Diabetes Paternal Uncle   . Cancer Paternal Grandmother     breast  . Diabetes Paternal Grandfather   . Heart disease Neg Hx   . Hypertension Neg Hx   . Stroke Neg Hx   . COPD Neg Hx    Social History  Substance Use Topics  . Smoking status: Never Smoker  . Smokeless tobacco: Current User    Types: Snuff  . Alcohol use Yes     Sanders: rare   Interim medical history since last visit reviewed. Allergies and medications reviewed  Review of Systems Per HPI unless specifically indicated above     Objective:    BP 120/62   Pulse 93   Temp 98.3 F (  36.8 C) (Oral)   Resp 16   Wt 281 lb (127.5 kg)   SpO2 97%   BMI 38.11 kg/m   Wt Readings from Last 3 Encounters:  10/06/16 281 lb (127.5 kg)  09/29/16 279 lb 12.2 oz (126.9 kg)  10/02/15 270 lb (122.5 kg)    Physical Exam  Constitutional: He appears well-developed and well-nourished. No distress.  obese  HENT:  Head: Normocephalic and atraumatic.  Eyes: EOM are normal. No scleral icterus.  Neck: No JVD present. No thyroid mass and no thyromegaly present.  Cardiovascular: Normal rate and regular rhythm.   Pulmonary/Chest: Effort normal and breath sounds normal.  Abdominal: Soft. Bowel sounds are normal. He exhibits no distension.  Genitourinary: Rectal exam shows no external hemorrhoid, no internal hemorrhoid, no mass, no tenderness and anal tone normal. Prostate is not enlarged and not tender.  Musculoskeletal: He  exhibits no edema.       Right knee: He exhibits no effusion.       Left knee: He exhibits no effusion.  Crepitus with active flex/ext of both knees  Neurological: He is alert. Coordination normal.  Skin: Skin is warm and dry. Burn (distal right biceps region; no drainage, no proximal erythema, 2nd degree) and lesion (firm indurated erythematous lesion with central eschar on dorsum of right wrist; no prox erythema, no fluctuance) noted. He is not diaphoretic. No pallor.  Psychiatric: He has a normal mood and affect. His behavior is normal. Judgment and thought content normal. His mood appears not anxious. He does not exhibit a depressed mood.   Results for orders placed or performed in visit on 09/29/16  CBC with Differential  Result Value Ref Range   WBC 12.9 (H) 3.8 - 10.6 K/uL   RBC 5.30 4.40 - 5.90 MIL/uL   Hemoglobin 14.2 13.0 - 18.0 g/dL   HCT 43.4 40.0 - 52.0 %   MCV 82.0 80.0 - 100.0 fL   MCH 26.9 26.0 - 34.0 pg   MCHC 32.7 32.0 - 36.0 g/dL   RDW 14.0 11.5 - 14.5 %   Platelets 185 150 - 440 K/uL   Neutrophils Relative % 52 %   Neutro Abs 6.7 (H) 1.4 - 6.5 K/uL   Lymphocytes Relative 37 %   Lymphs Abs 4.8 (H) 1.0 - 3.6 K/uL   Monocytes Relative 10 %   Monocytes Absolute 1.2 (H) 0.2 - 1.0 K/uL   Eosinophils Relative 1 %   Eosinophils Absolute 0.1 0 - 0.7 K/uL   Basophils Relative 0 %   Basophils Absolute 0.0 0 - 0.1 K/uL  Basic metabolic panel  Result Value Ref Range   Sodium 135 135 - 145 mmol/L   Potassium 3.7 3.5 - 5.1 mmol/L   Chloride 101 101 - 111 mmol/L   CO2 25 22 - 32 mmol/L   Glucose, Bld 124 (H) 65 - 99 mg/dL   BUN 26 (H) 6 - 20 mg/dL   Creatinine, Ser 1.29 (H) 0.61 - 1.24 mg/dL   Calcium 8.9 8.9 - 10.3 mg/dL   GFR calc non Af Amer >60 >60 mL/min   GFR calc Af Amer >60 >60 mL/min   Anion gap 9 5 - 15  Lactate dehydrogenase  Result Value Ref Range   LDH 166 98 - 192 U/L  Uric acid  Result Value Ref Range   Uric Acid, Serum 6.7 4.4 - 7.6 mg/dL        Assessment & Plan:   Problem List Items Addressed This Visit  Genitourinary   Erectile dysfunction    Explained that ED is concerning for coronary artery disease, vascular disease; refer to cardiologist for evaluation given his level of fatigue prior to considering testosterone therapy, which can increase risk of heart attack      Relevant Orders   Ambulatory referral to Cardiology   Chronic kidney disease, stage II (mild)    Check creatinine; avoid/limit NSAIDs; use turmeric for pain;hydrate        Other   Vitamin D deficiency    Check level and supplement and replace if needed      Relevant Orders   VITAMIN D 25 Hydroxy (Vit-D Deficiency, Fractures)   Snoring    Patient may have sleep apnea exacerbating fatigue; refer for sleep study      Relevant Orders   Ambulatory referral to Pulmonology   Prostate cancer screening    Check PSA      Relevant Orders   PSA   Obesity    Encouraged weight loss; 1 pound weight loss per week achievable with net reduction of 500 kcal per day consistently, for 52 pounds down by this time next year; I explained that obesity contributes to low testosterone      Relevant Orders   Lipid panel   Low testosterone    Discussed cardiac risk, other risks of supplemental testosterone therapy, including but not limited to DVT, PE, heart attack, heart failure, edema, prostate cancer, etc.      Relevant Orders   Testosterone, free   Testosterone   Ambulatory referral to Cardiology   Hypomagnesemia    Hx of low Mg2+, taking PPI which can decrease absorption of divalent cations; check and replace if needed; reduce or stop PPI if tolerated      Relevant Orders   Magnesium   Hypocalcemia    Check Ca2+, phos      Relevant Orders   COMPLETE METABOLIC PANEL WITH GFR   Hyperglycemia    Discussed recent glucose readings, risk of prediabetes, diabetes; work on weight loss      Relevant Orders   Hemoglobin A1c   Fatigue - Primary     Check thyroid, vit D, testosterone; encouraged weight loss      Relevant Orders   TSH   Ambulatory referral to Pulmonology   Ambulatory referral to Cardiology   Chronic lymphocytic leukemia ([place])    Followed by oncologist; recent counts reviewed      Relevant Medications   acetaminophen (TYLENOL) 500 MG tablet   Burn of right upper arm    No evidence of infection; keep clean, covered      Abscess, wrist    Healing abscess on right wrist; patient reports much improved; eschar formed, no fluctuance; no tracking; seek medical attention if getting worse, but hope this will resolve on its own now that it's been incised and drained (by patient)       Other Visit Diagnoses    Needs flu shot       Relevant Orders   Flu Vaccine QUAD 36+ mos PF IM (Fluarix & Fluzone Quad PF) (Completed)       Follow up plan: No Follow-up on file.  An after-visit summary was printed and given to the patient at [place].  Please see the patient instructions which may contain other information and recommendations beyond what is mentioned above in the assessment and plan.  Meds ordered this encounter  Medications  . acetaminophen (TYLENOL) 500 MG tablet    Sig: Take 500 mg  by mouth.  Marland Kitchen omeprazole (PRILOSEC OTC) 20 MG tablet    Sig: Take 1 tablet (20 mg total) by mouth every morning.    Dispense:  30 tablet    Refill:  1  . sildenafil (REVATIO) 20 MG tablet    Sig: Take 2-5 tablets (40-100 mg total) by mouth daily as needed.    Dispense:  50 tablet    Refill:  2    Orders Placed This Encounter  Procedures  . Flu Vaccine QUAD 36+ mos PF IM (Fluarix & Fluzone Quad PF)  . Lipid panel  . Testosterone, free  . Testosterone  . PSA  . VITAMIN D 25 Hydroxy (Vit-D Deficiency, Fractures)  . COMPLETE METABOLIC PANEL WITH GFR  . Magnesium  . Hemoglobin A1c  . TSH  . Ambulatory referral to Pulmonology  . Ambulatory referral to Cardiology   Face-to-face time with patient was more than 40 minutes,  >50% time spent counseling and coordination of care

## 2016-10-06 NOTE — Assessment & Plan Note (Signed)
Check level and supplement and replace if needed

## 2016-10-07 ENCOUNTER — Other Ambulatory Visit: Payer: Self-pay

## 2016-10-07 DIAGNOSIS — Z125 Encounter for screening for malignant neoplasm of prostate: Secondary | ICD-10-CM

## 2016-10-07 DIAGNOSIS — R0683 Snoring: Secondary | ICD-10-CM | POA: Insufficient documentation

## 2016-10-07 DIAGNOSIS — N529 Male erectile dysfunction, unspecified: Secondary | ICD-10-CM | POA: Insufficient documentation

## 2016-10-07 DIAGNOSIS — R5383 Other fatigue: Secondary | ICD-10-CM | POA: Insufficient documentation

## 2016-10-07 DIAGNOSIS — E559 Vitamin D deficiency, unspecified: Secondary | ICD-10-CM

## 2016-10-07 DIAGNOSIS — L02419 Cutaneous abscess of limb, unspecified: Secondary | ICD-10-CM | POA: Insufficient documentation

## 2016-10-07 DIAGNOSIS — N182 Chronic kidney disease, stage 2 (mild): Secondary | ICD-10-CM | POA: Insufficient documentation

## 2016-10-07 DIAGNOSIS — E6609 Other obesity due to excess calories: Secondary | ICD-10-CM

## 2016-10-07 DIAGNOSIS — R739 Hyperglycemia, unspecified: Secondary | ICD-10-CM

## 2016-10-07 DIAGNOSIS — T22031A Burn of unspecified degree of right upper arm, initial encounter: Secondary | ICD-10-CM | POA: Insufficient documentation

## 2016-10-07 DIAGNOSIS — R7989 Other specified abnormal findings of blood chemistry: Secondary | ICD-10-CM

## 2016-10-07 DIAGNOSIS — Z6838 Body mass index (BMI) 38.0-38.9, adult: Principal | ICD-10-CM

## 2016-10-07 LAB — COMPLETE METABOLIC PANEL WITH GFR
ALBUMIN: 4.1 g/dL (ref 3.6–5.1)
ALK PHOS: 93 U/L (ref 40–115)
ALT: 34 U/L (ref 9–46)
AST: 19 U/L (ref 10–40)
BUN: 20 mg/dL (ref 7–25)
CO2: 19 mmol/L — ABNORMAL LOW (ref 20–31)
Calcium: 9.4 mg/dL (ref 8.6–10.3)
Chloride: 103 mmol/L (ref 98–110)
Creat: 1.01 mg/dL (ref 0.60–1.35)
GFR, EST NON AFRICAN AMERICAN: 87 mL/min (ref >=60)
Glucose, Bld: 116 mg/dL — ABNORMAL HIGH (ref 65–99)
POTASSIUM: 4.9 mmol/L (ref 3.5–5.3)
Sodium: 140 mmol/L (ref 135–146)
Total Bilirubin: 0.4 mg/dL (ref 0.2–1.2)
Total Protein: 7.6 g/dL (ref 6.1–8.1)

## 2016-10-07 LAB — LIPID PANEL
CHOL/HDL RATIO: 3.2 ratio (ref <5.0)
Cholesterol: 116 mg/dL (ref <200)
HDL: 36 mg/dL — AB (ref >40)
LDL CALC: 61 mg/dL (ref <100)
Triglycerides: 94 mg/dL (ref <150)
VLDL: 19 mg/dL (ref <30)

## 2016-10-07 LAB — TSH: TSH: 0.65 m[IU]/L (ref 0.40–4.50)

## 2016-10-07 NOTE — Assessment & Plan Note (Addendum)
Encouraged weight loss; 1 pound weight loss per week achievable with net reduction of 500 kcal per day consistently, for 52 pounds down by this time next year; I explained that obesity contributes to low testosterone

## 2016-10-07 NOTE — Assessment & Plan Note (Signed)
Check creatinine; avoid/limit NSAIDs; use turmeric for pain;hydrate

## 2016-10-07 NOTE — Assessment & Plan Note (Signed)
No evidence of infection; keep clean, covered

## 2016-10-07 NOTE — Assessment & Plan Note (Signed)
Explained that ED is concerning for coronary artery disease, vascular disease; refer to cardiologist for evaluation given his level of fatigue prior to considering testosterone therapy, which can increase risk of heart attack

## 2016-10-07 NOTE — Assessment & Plan Note (Signed)
Check PSA. ?

## 2016-10-07 NOTE — Assessment & Plan Note (Signed)
Healing abscess on right wrist; patient reports much improved; eschar formed, no fluctuance; no tracking; seek medical attention if getting worse, but hope this will resolve on its own now that it's been incised and drained (by patient)

## 2016-10-07 NOTE — Assessment & Plan Note (Signed)
Followed by oncologist; recent counts reviewed

## 2016-10-07 NOTE — Assessment & Plan Note (Signed)
Discussed cardiac risk, other risks of supplemental testosterone therapy, including but not limited to DVT, PE, heart attack, heart failure, edema, prostate cancer, etc.

## 2016-10-07 NOTE — Assessment & Plan Note (Signed)
Hx of low Mg2+, taking PPI which can decrease absorption of divalent cations; check and replace if needed; reduce or stop PPI if tolerated

## 2016-10-07 NOTE — Assessment & Plan Note (Signed)
Check thyroid, vit D, testosterone; encouraged weight loss

## 2016-10-07 NOTE — Assessment & Plan Note (Signed)
Check Ca2+, phos

## 2016-10-07 NOTE — Assessment & Plan Note (Signed)
Patient may have sleep apnea exacerbating fatigue; refer for sleep study

## 2016-10-07 NOTE — Assessment & Plan Note (Signed)
Discussed recent glucose readings, risk of prediabetes, diabetes; work on weight loss

## 2016-10-08 LAB — MAGNESIUM: Magnesium: 2.2 mg/dL (ref 1.5–2.5)

## 2016-10-08 LAB — HEMOGLOBIN A1C
HEMOGLOBIN A1C: 5.6 % (ref <5.7)
Mean Plasma Glucose: 114 mg/dL

## 2016-10-08 LAB — PSA: PSA: 0.2 ng/mL (ref <=4.0)

## 2016-10-08 LAB — VITAMIN D 25 HYDROXY (VIT D DEFICIENCY, FRACTURES): Vit D, 25-Hydroxy: 31 ng/mL (ref 30–100)

## 2016-10-08 LAB — TESTOSTERONE: Testosterone: 180 ng/dL — ABNORMAL LOW (ref 250–827)

## 2016-10-11 LAB — TESTOSTERONE, FREE, LC/MS/MS

## 2016-10-18 ENCOUNTER — Encounter: Payer: Self-pay | Admitting: Family Medicine

## 2016-10-18 ENCOUNTER — Ambulatory Visit: Payer: PRIVATE HEALTH INSURANCE | Admitting: Family Medicine

## 2016-10-18 VITALS — BP 118/78 | HR 91 | Temp 98.6°F | Resp 16 | Ht 72.0 in | Wt 283.4 lb

## 2016-10-18 DIAGNOSIS — Z5321 Procedure and treatment not carried out due to patient leaving prior to being seen by health care provider: Secondary | ICD-10-CM

## 2016-10-21 NOTE — Progress Notes (Signed)
Patient left without being seen.

## 2016-10-26 ENCOUNTER — Ambulatory Visit: Payer: BLUE CROSS/BLUE SHIELD | Admitting: Cardiology

## 2016-11-01 ENCOUNTER — Ambulatory Visit: Payer: PRIVATE HEALTH INSURANCE | Admitting: Family Medicine

## 2016-11-02 ENCOUNTER — Ambulatory Visit: Payer: BLUE CROSS/BLUE SHIELD | Admitting: Cardiology

## 2016-11-24 ENCOUNTER — Encounter: Payer: Self-pay | Admitting: Family Medicine

## 2016-11-24 ENCOUNTER — Ambulatory Visit (INDEPENDENT_AMBULATORY_CARE_PROVIDER_SITE_OTHER): Payer: PRIVATE HEALTH INSURANCE | Admitting: Family Medicine

## 2016-11-24 VITALS — BP 120/84 | HR 87 | Temp 97.7°F | Resp 14 | Wt 291.7 lb

## 2016-11-24 DIAGNOSIS — R7309 Other abnormal glucose: Secondary | ICD-10-CM | POA: Diagnosis not present

## 2016-11-24 DIAGNOSIS — L03113 Cellulitis of right upper limb: Secondary | ICD-10-CM | POA: Diagnosis not present

## 2016-11-24 DIAGNOSIS — E349 Endocrine disorder, unspecified: Secondary | ICD-10-CM

## 2016-11-24 DIAGNOSIS — R0683 Snoring: Secondary | ICD-10-CM

## 2016-11-24 DIAGNOSIS — R7989 Other specified abnormal findings of blood chemistry: Secondary | ICD-10-CM

## 2016-11-24 LAB — GLUCOSE, POCT (MANUAL RESULT ENTRY): POC Glucose: 89 mg/dl (ref 70–99)

## 2016-11-24 MED ORDER — SILDENAFIL CITRATE 100 MG PO TABS: 50.0000 mg | ORAL_TABLET | Freq: Every day | ORAL | 2 refills | Status: DC | PRN

## 2016-11-24 MED ORDER — TESTOSTERONE CYPIONATE 200 MG/ML IM SOLN: 200.0000 mg | INTRAMUSCULAR | 5 refills | Status: DC

## 2016-11-24 MED ORDER — SULFAMETHOXAZOLE-TRIMETHOPRIM 800-160 MG PO TABS: 1.0000 | ORAL_TABLET | Freq: Two times a day (BID) | ORAL | 0 refills | Status: AC

## 2016-11-24 MED ORDER — TESTOSTERONE CYPIONATE 200 MG/ML IM SOLN: 200.0000 mg | INTRAMUSCULAR | 2 refills | Status: DC

## 2016-11-24 NOTE — Assessment & Plan Note (Addendum)
I discussed with the patient the side effects of supplemental testosterone therapy including: enlargement of the prostate gland that may in turn cause LUTS, possible increased risk of prostate cancer, DVTs and/or PEs, possible increased risk of heart attack or stroke, lower sperm count, swelling of the ankles, feet, or body, with or without heart failure, enlarged or painful breasts, sleep apnea, increased prostate specific antigen, mood swings, hypertension and increased red blood cell count. He uses words like "juice" and has a hx of using more medicine that prescribed, as noted today with the sildenafil He still needs to be evaluated by cardiologist before considering testosterone, so I voided the Rx for testerone  In addition, he needs to see the pulmonologist to be ruled out for sleep apnea; referral was made a few months ago Upon further consideration, if he is cleared by cardiologist for testosterone therapy, I think this would best be managed through a urologist's office with injections given there, or done so that the patient doesn't have access to 10 ml vials so he doesn't adjust the dose on his own or give doses more frequently; I am concerned about misuse and noncompliance

## 2016-11-24 NOTE — Patient Instructions (Addendum)
ONCE WE GET CLEARANCE FROM CARDIOLOGY... Give yourself the injection of testosterone, one milliliter intramuscularly every 14 days Do NOT use more Return for repeat digital rectal exam and labs in just under 3 months, about one week AFTER you give yourself an injection Watch for signs/symptoms we discussed and seek medical care for anything worrisome  Start the antibiotics today Please do eat yogurt daily or take a probiotic daily for the next month or two We want to replace the healthy germs in the gut If you notice foul, watery diarrhea in the next two months, schedule an appointment RIGHT AWAY  Check out the information at familydoctor.org entitled "Nutrition for Weight Loss: What You Need to Know about Fad Diets" Try to lose between 1-2 pounds per week by taking in fewer calories and burning off more calories You can succeed by limiting portions, limiting foods dense in calories and fat, becoming more active, and drinking 8 glasses of water a day (64 ounces) Don't skip meals, especially breakfast, as skipping meals may alter your metabolism Do not use over-the-counter weight loss pills or gimmicks that claim rapid weight loss A healthy BMI (or body mass index) is between 18.5 and 24.9 You can calculate your ideal BMI at the [place] website [employer]

## 2016-11-24 NOTE — Progress Notes (Signed)
BP 120/84   Pulse 87   Temp 97.7 F (36.5 C) (Oral)   Resp 14   Wt 291 lb 11.2 oz (132.3 kg)   SpO2 93%   BMI 39.56 kg/m    Subjective:    Patient ID: Albert Sanders Comment, male    DOB: [birth year], 50 y.o.   MRN: [id number]  HPI: Albert Sanders is a 50 y.o. male  Chief Complaint  Patient presents with  . Follow-up  . 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, started with pimple on the right arm; hx of same arm, different location Not allergic to antibiotics Hx of infections Had terrible gas; would burp and have foul smell; did that for a few weeks; had that once before when gallbladder was rotten Having normal BMs, no blood Testosterone level was 180 on 10/07/16; up from 113 on 08/28/15 Weight 290.4; he thinks he weighed around 175 to 180 around age 29 He would love to get down to 235 pounds now as an adult Thyroid function was normal; TSH was 0.65 on 10/07/16 Glucose in the prediabetes range; last food two hours ago; maybe some dry mouth; occasional blurry vision; fingerstick range was 89 here today Vit D had been low; 22 one year ago; came back up to 31 this last check PSA was normal; last DRE was recently He has done 1/2 cc of testosterone every 2 weeks and that wasn't close he says He would like to increase the dose; he used words like "juice" and wanted to either max it or ramp it up when discussing increasing the testosterone dose He does not have sleep apnea he says; however, review of previous note shows that he snores and was actually referred to pulmonologist for evaluation and possible sleep study to rule out OSA Some ED, has taken viagra; he says he has taken up to six pills of what I believe was the generic 20 mg sildenafil at times to get an effect (erection), more than directed on the prescription He has not seen the  cardiologist yet for issues reported previously; wants to just forget about it and get the testosterone started  Depression screen [facility] 2/9 11/24/2016 10/18/2016 10/06/2016  Decreased Interest 0 0 0  Down, Depressed, Hopeless 0 0 0  PHQ - 2 Score 0 0 0   Relevant past medical, surgical, family and social history reviewed Past Medical History:  Diagnosis Date  . Cancer ([place])    leukemia  . Clavicular fracture   . Complication of anesthesia    woke up during gallbladder surgery-pt states it takes alot of anesthesia to sedate him   . GERD (gastroesophageal reflux disease)   . Kidney stone   . Urinary calculus    Past Surgical History:  Procedure Laterality Date  . ANTERIOR CRUCIATE LIGAMENT REPAIR    . BICEPS TENDON REPAIR Right   . BONE MARROW BIOPSY  08-2015  . CHOLECYSTECTOMY    . CYSTOSCOPY W/ RETROGRADES Bilateral 09/16/2015   Procedure: CYSTOSCOPY WITH RETROGRADE PYELOGRAM;  Surgeon: Nickie Retort, MD;  Location: [facility] ORS;  Service: Urology;  Laterality: Bilateral;   Family History  Problem Relation Age of Onset  . Diabetes Paternal Uncle   . Cancer Paternal Grandmother     breast  . Diabetes Paternal Grandfather   . Heart disease Neg Hx   .  Hypertension Neg Hx   . Stroke Neg Hx   . COPD Neg Hx    Social History  Substance Use Topics  . Smoking status: Never Smoker  . Smokeless tobacco: Current User    Types: Snuff  . Alcohol use Yes     Comment: rare   Interim medical history since last visit reviewed. Allergies and medications reviewed  Review of Systems Per HPI unless specifically indicated above     Objective:    BP 120/84   Pulse 87   Temp 97.7 F (36.5 C) (Oral)   Resp 14   Wt 291 lb 11.2 oz (132.3 kg)   SpO2 93%   BMI 39.56 kg/m   Wt Readings from Last 3 Encounters:  11/25/16 292 lb (132.5 kg)  11/24/16 291 lb 11.2 oz (132.3 kg)  10/18/16 283 lb 7 oz (128.6 kg)    Physical Exam  Constitutional: He appears well-developed and  well-nourished.  Weight gain 8+ pounds over last 5 weeks  Eyes: EOM are normal. No scleral icterus.  Cardiovascular: Normal rate and regular rhythm.   Pulmonary/Chest: Effort normal and breath sounds normal.  Abdominal: He exhibits no distension.  Musculoskeletal:  No proximal erythema along right forearm; no epitrochlear lymphadenopathy; right hand wrapped in bandages, site of infection/surgery covered  Skin: He is not diaphoretic. No pallor.  Two erythematous lesions on the extensor surface of the RIGHT forearm; mild erythema, no proximal streaking; no fluctuance  Psychiatric: He has a normal mood and affect. His mood appears not anxious. His speech is not rapid and/or pressured. He does not exhibit a depressed mood.    Results for orders placed or performed in visit on 11/24/16  POCT Glucose (CBG)  Result Value Ref Range   POC Glucose 89 70 - 99 mg/dl      Assessment & Plan:   Problem List Items Addressed This Visit      Other   Snoring    Patient states today that he doesn't have sleep apnea; however, last note reviewed and he was actually referred to a pulmonologist for consideration of a sleep study to rule-out sleep apnea      Low testosterone - Primary    I discussed with the patient the side effects of supplemental testosterone therapy including: enlargement of the prostate gland that may in turn cause LUTS, possible increased risk of prostate cancer, DVTs and/or PEs, possible increased risk of heart attack or stroke, lower sperm count, swelling of the ankles, feet, or body, with or without heart failure, enlarged or painful breasts, sleep apnea, increased prostate specific antigen, mood swings, hypertension and increased red blood cell count. He uses words like "juice" and has a hx of using more medicine that prescribed, as noted today with the sildenafil He still needs to be evaluated by cardiologist before considering testosterone, so I voided the Rx for testerone  In  addition, he needs to see the pulmonologist to be ruled out for sleep apnea; referral was made a few months ago Upon further consideration, if he is cleared by cardiologist for testosterone therapy, I think this would best be managed through a urologist's office with injections given there, or done so that the patient doesn't have access to 10 ml vials so he doesn't adjust the dose on his own or give doses more frequently; I am concerned about misuse and noncompliance       Other Visit Diagnoses    Sugar blood level increased  glucose today was normal   Relevant Orders   POCT Glucose (CBG) (Completed)   Cellulitis of arm, right       start antibiotic; reasons to seek immediate care reviewed      Follow up plan: Return in about 3 months (around 02/15/2017) for visit and fasting labs.  An after-visit summary was printed and given to the patient at [place].  Please see the patient instructions which may contain other information and recommendations beyond what is mentioned above in the assessment and plan. MD note: I changed my mind about prescribing him testosterone upon further consideration, his haste to make light of his chest pain and SHOB and snoring, his desire to just skip any work-up with specialists and just start "juicing" I will therefore not prescribe him testosterone and will call him to explain, and that we'll refer him to a urologist for evaluation and treatment of his low testosterone level; if indicated, it may be safer to use clomid or have injections given by urologist's staff in the office under observation once cleared by cardiology and pulmonology (IF cleared)  Meds ordered this encounter  Medications  . sulfamethoxazole-trimethoprim (BACTRIM DS,SEPTRA DS) 800-160 MG tablet    Sig: Take 1 tablet by mouth 2 (two) times daily.    Dispense:  14 tablet    Refill:  0  . DISCONTD: testosterone cypionate (DEPOTESTOSTERONE CYPIONATE) 200 MG/ML injection    Sig: Inject 1  mL (200 mg total) into the muscle every 14 (fourteen) days.    Dispense:  1 mL    Refill:  5  . DISCONTD: testosterone cypionate (DEPOTESTOSTERONE CYPIONATE) 200 MG/ML injection    Sig: Inject 1 mL (200 mg total) into the muscle every 14 (fourteen) days.    Dispense:  2 mL    Refill:  2  . sildenafil (VIAGRA) 100 MG tablet    Sig: Take 0.5-1 tablets (50-100 mg total) by mouth daily as needed for erectile dysfunction.    Dispense:  10 tablet    Refill:  2    Orders Placed This Encounter  Procedures  . POCT Glucose (CBG)

## 2016-11-25 ENCOUNTER — Encounter: Payer: Self-pay | Admitting: Cardiology

## 2016-11-25 ENCOUNTER — Ambulatory Visit (INDEPENDENT_AMBULATORY_CARE_PROVIDER_SITE_OTHER): Payer: BLUE CROSS/BLUE SHIELD | Admitting: Cardiology

## 2016-11-25 VITALS — BP 110/84 | HR 68 | Ht 72.0 in | Wt 292.0 lb

## 2016-11-25 DIAGNOSIS — R079 Chest pain, unspecified: Secondary | ICD-10-CM

## 2016-11-25 DIAGNOSIS — R0602 Shortness of breath: Secondary | ICD-10-CM

## 2016-11-25 DIAGNOSIS — R5383 Other fatigue: Secondary | ICD-10-CM | POA: Diagnosis not present

## 2016-11-25 NOTE — Progress Notes (Signed)
Cardiology Office Note   Date:  11/25/2016   ID:  ZIERE DOCKEN, DOB 1965-12-20, MRN [id number]  Referring Doctor:  Enid Derry, MD   Cardiologist:   Wende Bushy, MD   Reason for consultation:  Chief Complaint  Patient presents with  . other    New Patient. Referred by Dr.Lada for fatigue. Reviewed meds with pt verbally.      History of Present Illness: Albert Sanders is a 50 y.o. male who presents forFatigue, chest pain, shortness of breath. Symptoms have been going on for about 6 months to one year. He experiences fatigue and lack of energy despite sleeping good number of hours. He had been scheduled for a sleep study, but he canceled it.  Chest pain and shortness of breath with exertion. Chest pain center of the chest, nonradiating discomfort-like sensation, mild to moderate intensity, lasting minutes at a time, resolving with rest. Same with shortness of breath on exertion, mild to moderate in intensity lasting a few minutes at a time. Nonprogressive over time.  No palpitations, loss of consciousness. No abdominal pain.   ROS:  Please see the history of present illness. Aside from mentioned under HPI, all other systems are reviewed and negative.     Past Medical History:  Diagnosis Date  . Cancer ([place])    leukemia  . Clavicular fracture   . Complication of anesthesia    woke up during gallbladder surgery-pt states it takes alot of anesthesia to sedate him   . GERD (gastroesophageal reflux disease)   . Kidney stone   . Urinary calculus     Past Surgical History:  Procedure Laterality Date  . ANTERIOR CRUCIATE LIGAMENT REPAIR    . BICEPS TENDON REPAIR Right   . BONE MARROW BIOPSY  08-2015  . CHOLECYSTECTOMY    . CYSTOSCOPY W/ RETROGRADES Bilateral 09/16/2015   Procedure: CYSTOSCOPY WITH RETROGRADE PYELOGRAM;  Surgeon: Nickie Retort, MD;  Location: [facility] ORS;  Service: Urology;  Laterality: Bilateral;     reports that he has never smoked. His  smokeless tobacco use includes Snuff. He reports that he drinks alcohol. He reports that he does not use drugs.   family history includes Cancer in his paternal grandmother; Diabetes in his paternal grandfather and paternal uncle.   Outpatient Medications Prior to Visit  Medication Sig Dispense Refill  . acetaminophen (TYLENOL) 500 MG tablet Take 500 mg by mouth.    Marland Kitchen omeprazole (PRILOSEC OTC) 20 MG tablet Take 1 tablet (20 mg total) by mouth every morning. 30 tablet 1  . sildenafil (VIAGRA) 100 MG tablet Take 0.5-1 tablets (50-100 mg total) by mouth daily as needed for erectile dysfunction. 10 tablet 2  . sulfamethoxazole-trimethoprim (BACTRIM DS,SEPTRA DS) 800-160 MG tablet Take 1 tablet by mouth 2 (two) times daily. 14 tablet 0   No facility-administered medications prior to visit.      Allergies: Contrast media [iodinated diagnostic agents] and Morphine    PHYSICAL EXAM: VS:  BP 110/84 (BP Location: Right Arm, Patient Position: Sitting, Cuff Size: Large)   Pulse 68   Ht 6' (1.829 m)   Wt 292 lb (132.5 kg)   BMI 39.60 kg/m  , Body mass index is 39.6 kg/m. Wt Readings from Last 3 Encounters:  11/25/16 292 lb (132.5 kg)  11/24/16 291 lb 11.2 oz (132.3 kg)  10/18/16 283 lb 7 oz (128.6 kg)    GENERAL:  well developed, well nourished, obese, not in acute distress HEENT: normocephalic, pink conjunctivae, anicteric  sclerae, no xanthelasma, normal dentition, oropharynx clear NECK:  no neck vein engorgement, JVP normal, no hepatojugular reflux, carotid upstroke brisk and symmetric, no bruit, no thyromegaly, no lymphadenopathy LUNGS:  good respiratory effort, clear to auscultation bilaterally CV:  PMI not displaced, no thrills, no lifts, S1 and S2 within normal limits, no palpable S3 or S4, no murmurs, no rubs, no gallops ABD:  Soft, nontender, nondistended, normoactive bowel sounds, no abdominal aortic bruit, no hepatomegaly, no splenomegaly MS: nontender back, no kyphosis, no  scoliosis, no joint deformities EXT:  2+ DP/PT pulses, no edema, no varicosities, no cyanosis, no clubbing SKIN: warm, nondiaphoretic, normal turgor, no ulcers NEUROPSYCH: alert, oriented to person, place, and time, sensory/motor grossly intact, normal mood, appropriate affect  Recent Labs: 09/29/2016: Hemoglobin 14.2; Platelets 185 10/07/2016: ALT 34; BUN 20; Creat 1.01; Magnesium 2.2; Potassium 4.9; Sodium 140; TSH 0.65   Lipid Panel    Component Value Date/Time   CHOL 116 10/07/2016 0956   TRIG 94 10/07/2016 0956   HDL 36 (L) 10/07/2016 0956   CHOLHDL 3.2 10/07/2016 0956   VLDL 19 10/07/2016 0956   LDLCALC 61 10/07/2016 0956     Other studies Reviewed:  EKG:  The ekg from 11/25/2016 was personally reviewed by me and it revealed sinus rhythm, 68 BPM.  Additional studies/ records that were reviewed personally reviewed by me today include: None available   ASSESSMENT AND PLAN: Fatigue Shortness of breath Chest pain Risk factors for CAD include gender, nicotine use from chewing tobacco. Advised patient to reschedule sleep study. It is important to rule out sleep apnea as a cause for fatigue. Recommend echocardiogram and pharmacologic nuclear stress test for evaluation of chest pain and shortness breath. Patient will be unable to walk on the treadmill for prolonged period of time due to back issues.   Current medicines are reviewed at length with the patient today.  The patient does not have concerns regarding medicines.  Labs/ tests ordered today include:  Orders Placed This Encounter  Procedures  . NM Myocar Multi W/Spect W/Wall Motion / EF  . EKG 12-Lead  . ECHOCARDIOGRAM COMPLETE    I had a lengthy and detailed discussion with the patient regarding diagnoses, prognosis, diagnostic options, treatment options , and side effects of medications.   I counseled the patient on importance of lifestyle modification including heart healthy diet, regular physical activity Once  cardiac workup is completed.   Disposition:   FU with undersigned after tests    Signed, Wende Bushy, MD  11/25/2016 4:00 PM    [place]  This note was generated in part with voice recognition software and I apologize for any typographical errors that were not detected and corrected.

## 2016-11-25 NOTE — Patient Instructions (Addendum)
Testing/Procedures: Your physician has requested that you have an echocardiogram. Echocardiography is a painless test that uses sound waves to create images of your heart. It provides your doctor with information about the size and shape of your heart and how well your heart's chambers and valves are working. This procedure takes approximately one hour. There are no restrictions for this procedure.  [place]  Your caregiver has ordered a Stress Test with nuclear imaging. The purpose of this test is to evaluate the blood supply to your heart muscle. This procedure is referred to as a "Non-Invasive Stress Test." This is because other than having an IV started in your vein, nothing is inserted or "invades" your body. Cardiac stress tests are done to find areas of poor blood flow to the heart by determining the extent of coronary artery disease (CAD). Some patients exercise on a treadmill, which naturally increases the blood flow to your heart, while others who are  unable to walk on a treadmill due to physical limitations have a pharmacologic/chemical stress agent called Lexiscan . This medicine will mimic walking on a treadmill by temporarily increasing your coronary blood flow.   Please note: these test may take anywhere between 2-4 hours to complete  PLEASE REPORT TO [place] AT THE FIRST DESK WILL DIRECT YOU WHERE TO GO  Date of Procedure:_Friday December 03, 2015 at 08:00AM_  Arrival Time for Procedure:_Arrive at 07:45AM to register____    PLEASE NOTIFY THE OFFICE AT LEAST 24 HOURS IN ADVANCE IF YOU ARE UNABLE TO [place].  [phone] AND  PLEASE NOTIFY NUCLEAR MEDICINE AT [facility] AT LEAST 24 HOURS IN ADVANCE IF YOU ARE UNABLE TO KEEP YOUR APPOINTMENT. [phone]  How to prepare for your Myoview test:  1. Do not eat or drink after midnight 2. No caffeine for 24 hours prior to test 3. No smoking 24 hours prior to test. 4. Your medication  may be taken with water.  If your doctor stopped a medication because of this test, do not take that medication. 5. Ladies, please do not wear dresses.  Skirts or pants are appropriate. Please wear a short sleeve shirt. 6. No perfume, cologne or lotion. 7. Wear comfortable walking shoes. No heels!   Follow-Up: Your physician recommends that you schedule a follow-up appointment as needed with Dr. Yvone Neu. We will call you with results and if needed schedule follow up at that time.   It was a pleasure seeing you today here in the office. Please do not hesitate to give [place] a call back if you have any further questions. [place], BSN     Pharmacologic Stress Electrocardiogram A pharmacologic stress electrocardiogram is a heart (cardiac) test that uses nuclear imaging to evaluate the blood supply to your heart. This test may also be called a pharmacologic stress electrocardiography. Pharmacologic means that a medicine is used to increase your heart rate and blood pressure.  This stress test is done to find areas of poor blood flow to the heart by determining the extent of coronary artery disease (CAD). Some people exercise on a treadmill, which naturally increases the blood flow to the heart. For those people unable to exercise on a treadmill, a medicine is used. This medicine stimulates your heart and will cause your heart to beat harder and more quickly, as if you were exercising.  Pharmacologic stress tests can help determine:  The adequacy of blood flow to your heart during increased levels  of activity in order to clear you for discharge home.  The extent of coronary artery blockage caused by CAD.  Your prognosis if you have suffered a heart attack.  The effectiveness of cardiac procedures done, such as an angioplasty, which can increase the circulation in your coronary arteries.  Causes of chest pain or pressure. LET [facility] CARE PROVIDER KNOW ABOUT:  Any allergies  you have.  All medicines you are taking, including vitamins, herbs, eye drops, creams, and over-the-counter medicines.  Previous problems you or members of your family have had with the use of anesthetics.  Any blood disorders you have.  Previous surgeries you have had.  Medical conditions you have.  Possibility of pregnancy, if this applies.  If you are currently breastfeeding. RISKS AND COMPLICATIONS Generally, this is a safe procedure. However, as with any procedure, complications can occur. Possible complications include:  You develop pain or pressure in the following areas:  Chest.  Jaw or neck.  Between your shoulder blades.  Radiating down your left arm.  Headache.  Dizziness or light-headedness.  Shortness of breath.  Increased or irregular heartbeat.  Low blood pressure.  Nausea or vomiting.  Flushing.  Redness going up the arm and slight pain during injection of medicine.  Heart attack (rare). BEFORE THE PROCEDURE   Avoid all forms of caffeine for 24 hours before your test or as directed by your health care provider. This includes coffee, tea (even decaffeinated tea), caffeinated sodas, chocolate, cocoa, and certain pain medicines.  Follow your health care provider's instructions regarding eating and drinking before the test.  Take your medicines as directed at regular times with water unless instructed otherwise. Exceptions may include:  If you have diabetes, ask how you are to take your insulin or pills. It is common to adjust insulin dosing the morning of the test.  If you are taking beta-blocker medicines, it is important to talk to your health care provider about these medicines well before the date of your test. Taking beta-blocker medicines may interfere with the test. In some cases, these medicines need to be changed or stopped 24 hours or more before the test.  If you wear a nitroglycerin patch, it may need to be removed prior to the test.  Ask your health care provider if the patch should be removed before the test.  If you use an inhaler for any breathing condition, bring it with you to the test.  If you are an outpatient, bring a snack so you can eat right after the stress phase of the test.  Do not smoke for 4 hours prior to the test or as directed by your health care provider.  Do not apply lotions, powders, creams, or oils on your chest prior to the test.  Wear comfortable shoes and clothing. Let your health care provider know if you were unable to complete or follow the preparations for your test. PROCEDURE   Multiple patches (electrodes) will be put on your chest. If needed, small areas of your chest may be shaved to get better contact with the electrodes. Once the electrodes are attached to your body, multiple wires will be attached to the electrodes, and your heart rate will be monitored.  An IV access will be started. A nuclear trace (isotope) is given. The isotope may be given intravenously, or it may be swallowed. Nuclear refers to several types of radioactive isotopes, and the nuclear isotope lights up the arteries so that the nuclear images are clear. The  isotope is absorbed by your body. This results in low radiation exposure.  A resting nuclear image is taken to show how your heart functions at rest.  A medicine is given through the IV access.  A second scan is done about 1 hour after the medicine injection and determines how your heart functions under stress.  During this stress phase, you will be connected to an electrocardiogram machine. Your blood pressure and oxygen levels will be monitored. AFTER THE PROCEDURE   Your heart rate and blood pressure will be monitored after the test.  You may return to your normal schedule, including diet,activities, and medicines, unless your health care provider tells you otherwise. This information is not intended to replace advice given to you by your health care  provider. Make sure you discuss any questions you have with your health care provider. Document Released: 04/02/2009 Document Revised: 11/19/2013 Document Reviewed: 07/22/2013 Elsevier Interactive Patient Education  2017 [place]. Echocardiogram An echocardiogram, or echocardiography, uses sound waves (ultrasound) to produce an image of your heart. The echocardiogram is simple, painless, obtained within a short period of time, and offers valuable information to your health care provider. The images from an echocardiogram can provide information such as:  Evidence of coronary artery disease (CAD).  Heart size.  Heart muscle function.  Heart valve function.  Aneurysm detection.  Evidence of a past heart attack.  Fluid buildup around the heart.  Heart muscle thickening.  Assess heart valve function. Tell a health care provider about:  Any allergies you have.  All medicines you are taking, including vitamins, herbs, eye drops, creams, and over-the-counter medicines.  Any problems you or family members have had with anesthetic medicines.  Any blood disorders you have.  Any surgeries you have had.  Any medical conditions you have.  Whether you are pregnant or may be pregnant. What happens before the procedure? No special preparation is needed. Eat and drink normally. What happens during the procedure?  In order to produce an image of your heart, gel will be applied to your chest and a wand-like tool (transducer) will be moved over your chest. The gel will help transmit the sound waves from the transducer. The sound waves will harmlessly bounce off your heart to allow the heart images to be captured in real-time motion. These images will then be recorded.  You may need an IV to receive a medicine that improves the quality of the pictures. What happens after the procedure? You may return to your normal schedule including diet, activities, and medicines, unless your health  care provider tells you otherwise. This information is not intended to replace advice given to you by your health care provider. Make sure you discuss any questions you have with your health care provider. Document Released: 11/11/2000 Document Revised: 07/02/2016 Document Reviewed: 07/22/2013 Elsevier Interactive Patient Education  2017 [employer].

## 2016-12-02 ENCOUNTER — Ambulatory Visit: Payer: BLUE CROSS/BLUE SHIELD

## 2016-12-02 ENCOUNTER — Telehealth: Payer: Self-pay | Admitting: Cardiology

## 2016-12-02 NOTE — Telephone Encounter (Signed)
[facility] Nuclear Department calling to let us know patient did not show for his Myoview this morning

## 2016-12-04 ENCOUNTER — Telehealth: Payer: Self-pay | Admitting: Family Medicine

## 2016-12-04 DIAGNOSIS — R0683 Snoring: Secondary | ICD-10-CM

## 2016-12-04 DIAGNOSIS — R7989 Other specified abnormal findings of blood chemistry: Secondary | ICD-10-CM

## 2016-12-04 NOTE — Assessment & Plan Note (Signed)
Patient states today that he doesn't have sleep apnea; however, last note reviewed and he was actually referred to a pulmonologist for consideration of a sleep study to rule-out sleep apnea

## 2016-12-04 NOTE — Telephone Encounter (Signed)
I will call patient on Monday to f/u on cardiology visit Also, to f/u on the pulmonology visit (referral entered in November) to r/o sleep apnea; this must be done before anyone considers him for testosterone therapy Lastly, I'll talk to him about my personal reservations about prescribing testosterone, risk of misuse, will refer to urology for consideration of other therapy or monitored injections

## 2016-12-06 NOTE — Telephone Encounter (Signed)
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

## 2016-12-06 NOTE — Telephone Encounter (Signed)
No answer and voice mail is full.

## 2016-12-06 NOTE — Telephone Encounter (Signed)
Patient states that he didn't show up for stress test due to bad weather on Friday 12/02/16. He stated that he did want to reschedule but that he is at work right now and requested that I call him back after 3:30 pm today. He verbalized understanding of our conversation and had no further questions at this time. Will call him back later today to schedule.

## 2016-12-07 NOTE — Telephone Encounter (Signed)
No answer and voice mail is full.

## 2016-12-07 NOTE — Telephone Encounter (Signed)
Patient states he is at work and needs me to call him back after 3:30PM. Let him know that I called yesterday but it went to voicemail and it was full so could not leave a message. He verbalized understanding and had no further questions.

## 2016-12-07 NOTE — Telephone Encounter (Signed)
Patient notified in detail, will need all 3 referrals, pulmonologist,cardio and urology, when they called first time referral was placed he did not schedule.

## 2016-12-08 NOTE — Telephone Encounter (Signed)
No answer. Voicemail full.

## 2016-12-09 ENCOUNTER — Encounter: Payer: Self-pay | Admitting: *Deleted

## 2016-12-09 NOTE — Telephone Encounter (Signed)
No answer/Voicemail full. Will mail letter to patient to call when available to reschedule stress test.

## 2016-12-14 NOTE — Assessment & Plan Note (Signed)
Refer to pulmonologist for consideration of sleep study, evaluate for possible sleep apnea

## 2016-12-14 NOTE — Telephone Encounter (Signed)
He's already seeing Dr. Yvone Neu, so I entered new referral for pulm, and new referral for urology; thank you

## 2016-12-14 NOTE — Assessment & Plan Note (Signed)
Referring to urologist for evaluation, treatment options

## 2016-12-22 ENCOUNTER — Other Ambulatory Visit: Payer: BLUE CROSS/BLUE SHIELD

## 2016-12-22 ENCOUNTER — Other Ambulatory Visit: Payer: PRIVATE HEALTH INSURANCE

## 2016-12-22 ENCOUNTER — Encounter: Payer: Self-pay | Admitting: *Deleted

## 2016-12-30 ENCOUNTER — Encounter: Payer: Self-pay | Admitting: *Deleted

## 2017-01-12 ENCOUNTER — Inpatient Hospital Stay: Payer: BLUE CROSS/BLUE SHIELD

## 2017-01-12 ENCOUNTER — Inpatient Hospital Stay: Payer: BLUE CROSS/BLUE SHIELD | Admitting: Hematology and Oncology

## 2017-01-12 NOTE — Progress Notes (Deleted)
[facility] day:  01/12/17  Chief Complaint: Albert Sanders is a 51 y.o. male with chronic lymphocytic leukemia who is seen for 3 month assessment.  HPI: The patient was last seen in the medical oncology clinic on 09/29/2016.  At that time, he had chronic fatigue and intermittent sweats.  He had a low testosterone level.  Exam revealed no adenopathy or hepatosplenomegaly.  CBC revealed a hematocrit of 43.4, hemoglobin 14.2, MCV 82, platelets 185,000, WBC 12,900.  ANC was 6700.   he had chronic mild fatigue.  He had no B symptoms.  He was gaining a significant amount of weight (20 pounds in 3 weeks).  He had a low testosterone level.  Exam revealed no adenopathy or hepatosplenomegaly.  He was lost to follow-up.  He states that his insurance ran out.  He reports a bite to the distal right fourth finger by a parrot on 09/22/2015.  He was seen in the [facility] emergency room and prescribed antibiotics and pain medications. He represented a few days later with severe pain and a black finger. He was transported to a [facility] where his wound was debrided. He eventually obtained a skin graft. He has had no other infections.   Symptomatically, he notes sweats "from time to time". He had 2 episodes last week. Sometimes he feels cold.  He feels tired. He states that his testosterone is low (114). He denies any adenopathy, bruising or bleeding.  He had a knot on his right wrist recently. He use one of his testosterone needles to his express the pus from this area.  It is healing well.   Past Medical History:  Diagnosis Date  . Cancer ([place])    leukemia  . Clavicular fracture   . Complication of anesthesia    woke up during gallbladder surgery-pt states it takes alot of anesthesia to sedate him   . GERD (gastroesophageal reflux disease)   . Kidney stone   . Urinary calculus     Past Surgical History:  Procedure Laterality Date  . ANTERIOR CRUCIATE LIGAMENT  REPAIR    . BICEPS TENDON REPAIR Right   . BONE MARROW BIOPSY  08-2015  . CHOLECYSTECTOMY    . CYSTOSCOPY W/ RETROGRADES Bilateral 09/16/2015   Procedure: CYSTOSCOPY WITH RETROGRADE PYELOGRAM;  Surgeon: Nickie Retort, MD;  Location: [facility] ORS;  Service: Urology;  Laterality: Bilateral;    Family History  Problem Relation Age of Onset  . Diabetes Paternal Uncle   . Cancer Paternal Grandmother     breast  . Diabetes Paternal Grandfather   . Heart disease Neg Hx   . Hypertension Neg Hx   . Stroke Neg Hx   . COPD Neg Hx     Social History:  reports that he has never smoked. His smokeless tobacco use includes Snuff. He reports that he drinks alcohol. He reports that he does not use drugs.  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 He dips 1 can a day. He rarely drinks alcohol. 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   Allergies:  Allergies  Allergen Reactions  . Contrast Media [Iodinated Diagnostic Agents] Anaphylaxis  . Morphine Itching    Current Medications: Current Outpatient Prescriptions  Medication Sig Dispense Refill  . acetaminophen (TYLENOL) 500 MG tablet Take 500 mg by mouth.    Marland Kitchen omeprazole (PRILOSEC OTC) 20 MG tablet Take 1 tablet (20 mg total) by mouth every morning. 30 tablet 1  .  sildenafil (VIAGRA) 100 MG tablet Take 0.5-1 tablets (50-100 mg total) by mouth daily as needed for erectile dysfunction. 10 tablet 2   No current facility-administered medications for this visit.     Review of Systems:  GENERAL:  General fatigue.  No fever.  Off and on sweats.  Weight down 4 pounds since 08/01/2015. PERFORMANCE STATUS (ECOG):  0 HEENT:  No runny nose, mouth sores or tenderness. Lungs: No shortness of breath or cough.  No hemoptysis. Cardiac:  No chest pain, palpitations, orthopnea, or PND. GI:  No nausea, vomiting, diarrhea, melena or hematochezia. GU:  Low testosterone.  No urgency, frequency, dysuria, or hematuria. Musculoskeletal:  Chronic back issues.   No joint pain.  No muscle tenderness. Extremities:  No pain or swelling.  Interval parrot bite and infection (see HPI). Skin:  No rashes or skin changes. Neuro:  No headache, numbness or weakness, balance or coordination issues. Endocrine:  No diabetes, thyroid issues, hot flashes or night sweats. Psych:  No mood changes, depression or anxiety. Pain:  No focal pain. Review of systems:  All other systems reviewed and found to be negative.  Physical Exam: There were no vitals taken for this visit. GENERAL:  Well developed, well nourished, heavy set gentleman sitting comfortably in the exam room in no acute distress. MENTAL STATUS:  Alert and oriented to person, place and time. HEAD:  Wearing a [employer] cap.  Crew cut/shaved hair.  Normocephalic, atraumatic, face symmetric, no Cushingoid features. EYES:  Blue eyes.  Pupils equal round and reactive to light and accomodation.  No conjunctivitis or scleral icterus. ENT:  Oropharynx clear without lesion.  Tongue normal. Mucous membranes moist.  RESPIRATORY:  Clear to auscultation without rales, wheezes or rhonchi. CARDIOVASCULAR:  Regular rate and rhythm without murmur, rub or gallop. ABDOMEN:  Fully round.  Soft, non-tender, with active bowel sounds, and no appreciable hepatosplenomegaly.  No masses. SKIN:  No rashes, ulcers or lesions. EXTREMITIES: Dorsal surface of right wist with deep cavitary lesion (dry).  No edema, no skin discoloration or tenderness.  No palpable cords. LYMPH NODES: No palpable cervical, supraclavicular, axillary or inguinal adenopathy  NEUROLOGICAL: Unremarkable. PSYCH:  Appropriate.    No visits with results within 3 Day(s) from this visit.  Latest known visit with results is:  Office Visit on 11/24/2016  Component Date Value Ref Range Status  . POC Glucose 11/24/2016 89  70 - 99 mg/dl Final    Assessment:  Albert Sanders is a 51 y.o. 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  He was noted to  have abdominal adenopathy and mild splenomegaly on CT scan in 02/2015 following a fall.  He represented with abdominal pain, fever, and sweats.  Abdominal and pelvic CT scan without contrast on 07/25/2015 revealed abdominal adenopathy with the largest lymph node in the portacaval region measuring 1.9 cm (stable).  There was mild splenomegaly (15 cm).    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  Labs on 08/07/2015 revealed a hematocrit of 43, hemoglobin 13.9, platelets 200,000, white count 13,000 with an ANC of 500. Differential included 50% segs and 42% lymphs with a mild lymphocytosis (5500).  Comprehensive metabolic,  LDH, uric acid, sedimentation rate, SPEP, and immunoglobulins were normal.  PET scan on 08/07/2015 revealed no hypermetabolic lymphadenopathy or metastatic disease. There was a mildly enlarged gastrohepatic ligament, portacaval, and right external iliac lymph nodes (1.3-1.9 cm).  He had a top normal spleen size.   Bone marrow on 09/04/2015 revealed <10% involvement with B-cell lymphoproliferative neoplasm with a B-cell CLL/SLL immunophenotype.  There was mild non-specific dyserythropoiesis and mild increase in reticulin.  Storage iron was present. Flow cytometry revealed CD5+ monotypic (clonal) B cell population (20% of sample) with B-cell CLL/SLL immunophenotype.  Cytogenetics were normal (46, XY).  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  He has had infections.  He had a severe flexor tenosynovitis of the right 4th finger in 08/2015 after being bitten by a parrot.  He had debridement, antibiotics, and a skin graft.  He  had an infection overlying his right wrist recently (unroofed by patient).  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  Cystoscopy with retrograde pyelogram on 09/16/2015 revealed no abnormalities.  Testosterone was 113 [phone]) on 08/28/2015.    Symptomatically, he has chronic fatigue and intermittent sweats.  He has a low testosterone level.  He has no adenopathy or hepatosplenomegaly.  Plan: 1.  Labs today:  CBC with diff, CMP, uric acid, immunoglobulin levels.  2.  Discuss interim events and infections.  Discuss indications for treatment. 3.  Review FISH studies (P53 mutation). 4.  Follow-up with PCP regarding low testosterone and fatigue. 5. RTC in 3-4 months for MD assessment and labs (CBC with diff, CMP, LDH, uric acid, immunoglobulin levels).   Lequita Asal, MD  01/12/2017, 6:45 AM

## 2017-01-20 ENCOUNTER — Inpatient Hospital Stay: Payer: BLUE CROSS/BLUE SHIELD | Admitting: Hematology and Oncology

## 2017-01-20 ENCOUNTER — Inpatient Hospital Stay: Payer: BLUE CROSS/BLUE SHIELD

## 2017-01-20 NOTE — Progress Notes (Deleted)
[name] day:  01/20/17  Chief Complaint: Albert Sanders is a 50 y.o. male with chronic lymphocytic leukemia who is seen for 3 month assessment.  HPI: The patient was last seen in the medical oncology clinic on 09/29/2016.  At that time, he had chronic fatigue and intermittent sweats.  He had a low testosterone level.  Exam revealed no adenopathy or hepatosplenomegaly.  CBC revealed a hematocrit of 43.4, hemoglobin 14.2, MCV 82, platelets 185,000, WBC 12,900.  ANC was 6700.   he had chronic mild fatigue.  He had no B symptoms.  He was gaining a significant amount of weight (20 pounds in 3 weeks).  He had a low testosterone level.  Exam revealed no adenopathy or hepatosplenomegaly.  He was lost to follow-up.  He states that his insurance ran out.  He reports a bite to the distal right fourth finger by a parrot on 09/22/2015.  He was seen in the [facility] emergency room and prescribed antibiotics and pain medications. He represented a few days later with severe pain and a black finger. He was transported to a [facility] where his wound was debrided. He eventually obtained a skin graft. He has had no other infections.   Symptomatically, he notes sweats "from time to time". He had 2 episodes last week. Sometimes he feels cold.  He feels tired. He states that his testosterone is low (114). He denies any adenopathy, bruising or bleeding.  He had a knot on his right wrist recently. He use one of his testosterone needles to his express the pus from this area.  It is healing well.   Past Medical History:  Diagnosis Date  . Cancer ([place])    leukemia  . Clavicular fracture   . Complication of anesthesia    woke up during gallbladder surgery-pt states it takes alot of anesthesia to sedate him   . GERD (gastroesophageal reflux disease)   . Kidney stone   . Urinary calculus     Past Surgical History:  Procedure Laterality Date  . ANTERIOR CRUCIATE LIGAMENT  REPAIR    . BICEPS TENDON REPAIR Right   . BONE MARROW BIOPSY  08-2015  . CHOLECYSTECTOMY    . CYSTOSCOPY W/ RETROGRADES Bilateral 09/16/2015   Procedure: CYSTOSCOPY WITH RETROGRADE PYELOGRAM;  Surgeon: Nickie Retort, MD;  Location: [facility] ORS;  Service: Urology;  Laterality: Bilateral;    Family History  Problem Relation Age of Onset  . Diabetes Paternal Uncle   . Cancer Paternal Grandmother     breast  . Diabetes Paternal Grandfather   . Heart disease Neg Hx   . Hypertension Neg Hx   . Stroke Neg Hx   . COPD Neg Hx     Social History:  reports that he has never smoked. His smokeless tobacco use includes Snuff. He reports that he drinks alcohol. He reports that he does not use drugs.  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 He dips 1 can a day. He rarely drinks alcohol. 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   Allergies:  Allergies  Allergen Reactions  . Contrast Media [Iodinated Diagnostic Agents] Anaphylaxis  . Morphine Itching    Current Medications: Current Outpatient Prescriptions  Medication Sig Dispense Refill  . acetaminophen (TYLENOL) 500 MG tablet Take 500 mg by mouth.    Marland Kitchen omeprazole (PRILOSEC OTC) 20 MG tablet Take 1 tablet (20 mg total) by mouth every morning. 30 tablet 1  .  sildenafil (VIAGRA) 100 MG tablet Take 0.5-1 tablets (50-100 mg total) by mouth daily as needed for erectile dysfunction. 10 tablet 2   No current facility-administered medications for this visit.     Review of Systems:  GENERAL:  General fatigue.  No fever.  Off and on sweats.  Weight down 4 pounds since 08/01/2015. PERFORMANCE STATUS (ECOG):  0 HEENT:  No runny nose, mouth sores or tenderness. Lungs: No shortness of breath or cough.  No hemoptysis. Cardiac:  No chest pain, palpitations, orthopnea, or PND. GI:  No nausea, vomiting, diarrhea, melena or hematochezia. GU:  Low testosterone.  No urgency, frequency, dysuria, or hematuria. Musculoskeletal:  Chronic back issues.   No joint pain.  No muscle tenderness. Extremities:  No pain or swelling.  Interval parrot bite and infection (see HPI). Skin:  No rashes or skin changes. Neuro:  No headache, numbness or weakness, balance or coordination issues. Endocrine:  No diabetes, thyroid issues, hot flashes or night sweats. Psych:  No mood changes, depression or anxiety. Pain:  No focal pain. Review of systems:  All other systems reviewed and found to be negative.  Physical Exam: There were no vitals taken for this visit. GENERAL:  Well developed, well nourished, heavy set gentleman sitting comfortably in the exam room in no acute distress. MENTAL STATUS:  Alert and oriented to person, place and time. HEAD:  Wearing a [employer] cap.  Crew cut/shaved hair.  Normocephalic, atraumatic, face symmetric, no Cushingoid features. EYES:  Blue eyes.  Pupils equal round and reactive to light and accomodation.  No conjunctivitis or scleral icterus. ENT:  Oropharynx clear without lesion.  Tongue normal. Mucous membranes moist.  RESPIRATORY:  Clear to auscultation without rales, wheezes or rhonchi. CARDIOVASCULAR:  Regular rate and rhythm without murmur, rub or gallop. ABDOMEN:  Fully round.  Soft, non-tender, with active bowel sounds, and no appreciable hepatosplenomegaly.  No masses. SKIN:  No rashes, ulcers or lesions. EXTREMITIES: Dorsal surface of right wist with deep cavitary lesion (dry).  No edema, no skin discoloration or tenderness.  No palpable cords. LYMPH NODES: No palpable cervical, supraclavicular, axillary or inguinal adenopathy  NEUROLOGICAL: Unremarkable. PSYCH:  Appropriate.    No visits with results within 3 Day(s) from this visit.  Latest known visit with results is:  Office Visit on 11/24/2016  Component Date Value Ref Range Status  . POC Glucose 11/24/2016 89  70 - 99 mg/dl Final    Assessment:  Albert Sanders is a 51 y.o. 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  He was noted to  have abdominal adenopathy and mild splenomegaly on CT scan in 02/2015 following a fall.  He represented with abdominal pain, fever, and sweats.  Abdominal and pelvic CT scan without contrast on 07/25/2015 revealed abdominal adenopathy with the largest lymph node in the portacaval region measuring 1.9 cm (stable).  There was mild splenomegaly (15 cm).    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  Labs on 08/07/2015 revealed a hematocrit of 43, hemoglobin 13.9, platelets 200,000, white count 13,000 with an ANC of 500. Differential included 50% segs and 42% lymphs with a mild lymphocytosis (5500).  Comprehensive metabolic,  LDH, uric acid, sedimentation rate, SPEP, and immunoglobulins were normal.  PET scan on 08/07/2015 revealed no hypermetabolic lymphadenopathy or metastatic disease. There was a mildly enlarged gastrohepatic ligament, portacaval, and right external iliac lymph nodes (1.3-1.9 cm).  He had a top normal spleen size.   Bone marrow on 09/04/2015 revealed <10% involvement with B-cell lymphoproliferative neoplasm with a B-cell CLL/SLL immunophenotype.  There was mild non-specific dyserythropoiesis and mild increase in reticulin.  Storage iron was present. Flow cytometry revealed CD5+ monotypic (clonal) B cell population (20% of sample) with B-cell CLL/SLL immunophenotype.  Cytogenetics were normal (46, XY).  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  He has had infections.  He had a severe flexor tenosynovitis of the right 4th finger in 08/2015 after being bitten by a parrot.  He had debridement, antibiotics, and a skin graft.  He  had an infection overlying his right wrist recently (unroofed by patient).  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  Cystoscopy with retrograde pyelogram on 09/16/2015 revealed no abnormalities.  Testosterone was 113 [phone]) on 08/28/2015.    Symptomatically, he has chronic fatigue and intermittent sweats.  He has a low testosterone level.  He has no adenopathy or hepatosplenomegaly.  Plan: 1.  Labs today:  CBC with diff, CMP, uric acid, immunoglobulin levels.  2.  Discuss interim events and infections.  Discuss indications for treatment. 3.  Review FISH studies (P53 mutation). 4.  Follow-up with PCP regarding low testosterone and fatigue. 5. RTC in 3-4 months for MD assessment and labs (CBC with diff, CMP, LDH, uric acid, immunoglobulin levels).   Lequita Asal, MD  01/20/2017, 4:21 PM

## 2017-02-02 ENCOUNTER — Inpatient Hospital Stay: Payer: BLUE CROSS/BLUE SHIELD | Admitting: Hematology and Oncology

## 2017-02-02 ENCOUNTER — Inpatient Hospital Stay: Payer: BLUE CROSS/BLUE SHIELD

## 2017-02-02 NOTE — Progress Notes (Deleted)
[facility] day:  02/02/17  Chief Complaint: Albert Sanders is a 51 y.o. male with chronic lymphocytic leukemia who is seen for 3 month assessment.  HPI: The patient was last seen in the medical oncology clinic on 09/29/2016.  At that time, he had chronic fatigue and intermittent sweats.  He had a low testosterone level.  Exam revealed no adenopathy or hepatosplenomegaly.  CBC revealed a hematocrit of 43.4, hemoglobin 14.2, MCV 82, platelets 185,000, WBC 12,900.  ANC was 6700.   he had chronic mild fatigue.  He had no B symptoms.  He was gaining a significant amount of weight (20 pounds in 3 weeks).  He had a low testosterone level.  Exam revealed no adenopathy or hepatosplenomegaly.  He was lost to follow-up.  He states that his insurance ran out.  He reports a bite to the distal right fourth finger by a parrot on 09/22/2015.  He was seen in the [facility] emergency room and prescribed antibiotics and pain medications. He represented a few days later with severe pain and a black finger. He was transported to a [facility] where his wound was debrided. He eventually obtained a skin graft. He has had no other infections.   Symptomatically, he notes sweats "from time to time". He had 2 episodes last week. Sometimes he feels cold.  He feels tired. He states that his testosterone is low (114). He denies any adenopathy, bruising or bleeding.  He had a knot on his right wrist recently. He use one of his testosterone needles to his express the pus from this area.  It is healing well.   Past Medical History:  Diagnosis Date  . Cancer ([place])    leukemia  . Clavicular fracture   . Complication of anesthesia    woke up during gallbladder surgery-pt states it takes alot of anesthesia to sedate him   . GERD (gastroesophageal reflux disease)   . Kidney stone   . Urinary calculus     Past Surgical History:  Procedure Laterality Date  . ANTERIOR CRUCIATE LIGAMENT  REPAIR    . BICEPS TENDON REPAIR Right   . BONE MARROW BIOPSY  08-2015  . CHOLECYSTECTOMY    . CYSTOSCOPY W/ RETROGRADES Bilateral 09/16/2015   Procedure: CYSTOSCOPY WITH RETROGRADE PYELOGRAM;  Surgeon: Nickie Retort, MD;  Location: [facility] ORS;  Service: Urology;  Laterality: Bilateral;    Family History  Problem Relation Age of Onset  . Diabetes Paternal Uncle   . Cancer Paternal Grandmother     breast  . Diabetes Paternal Grandfather   . Heart disease Neg Hx   . Hypertension Neg Hx   . Stroke Neg Hx   . COPD Neg Hx     Social History:  reports that he has never smoked. His smokeless tobacco use includes Snuff. He reports that he drinks alcohol. He reports that he does not use drugs.  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 He dips 1 can a day. He rarely drinks alcohol. 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   Allergies:  Allergies  Allergen Reactions  . Contrast Media [Iodinated Diagnostic Agents] Anaphylaxis  . Morphine Itching    Current Medications: Current Outpatient Prescriptions  Medication Sig Dispense Refill  . acetaminophen (TYLENOL) 500 MG tablet Take 500 mg by mouth.    Marland Kitchen omeprazole (PRILOSEC OTC) 20 MG tablet Take 1 tablet (20 mg total) by mouth every morning. 30 tablet 1  .  sildenafil (VIAGRA) 100 MG tablet Take 0.5-1 tablets (50-100 mg total) by mouth daily as needed for erectile dysfunction. 10 tablet 2   No current facility-administered medications for this visit.     Review of Systems:  GENERAL:  General fatigue.  No fever.  Off and on sweats.  Weight down 4 pounds since 08/01/2015. PERFORMANCE STATUS (ECOG):  0 HEENT:  No runny nose, mouth sores or tenderness. Lungs: No shortness of breath or cough.  No hemoptysis. Cardiac:  No chest pain, palpitations, orthopnea, or PND. GI:  No nausea, vomiting, diarrhea, melena or hematochezia. GU:  Low testosterone.  No urgency, frequency, dysuria, or hematuria. Musculoskeletal:  Chronic back issues.   No joint pain.  No muscle tenderness. Extremities:  No pain or swelling.  Interval parrot bite and infection (see HPI). Skin:  No rashes or skin changes. Neuro:  No headache, numbness or weakness, balance or coordination issues. Endocrine:  No diabetes, thyroid issues, hot flashes or night sweats. Psych:  No mood changes, depression or anxiety. Pain:  No focal pain. Review of systems:  All other systems reviewed and found to be negative.  Physical Exam: There were no vitals taken for this visit. GENERAL:  Well developed, well nourished, heavy set gentleman sitting comfortably in the exam room in no acute distress. MENTAL STATUS:  Alert and oriented to person, place and time. HEAD:  Wearing a [place] [employer] cap.  Crew cut/shaved hair.  Normocephalic, atraumatic, face symmetric, no Cushingoid features. EYES:  Blue eyes.  Pupils equal round and reactive to light and accomodation.  No conjunctivitis or scleral icterus. ENT:  Oropharynx clear without lesion.  Tongue normal. Mucous membranes moist.  RESPIRATORY:  Clear to auscultation without rales, wheezes or rhonchi. CARDIOVASCULAR:  Regular rate and rhythm without murmur, rub or gallop. ABDOMEN:  Fully round.  Soft, non-tender, with active bowel sounds, and no appreciable hepatosplenomegaly.  No masses. SKIN:  No rashes, ulcers or lesions. EXTREMITIES: Dorsal surface of right wist with deep cavitary lesion (dry).  No edema, no skin discoloration or tenderness.  No palpable cords. LYMPH NODES: No palpable cervical, supraclavicular, axillary or inguinal adenopathy  NEUROLOGICAL: Unremarkable. PSYCH:  Appropriate.    No visits with results within 3 Day(s) from this visit.  Latest known visit with results is:  Office Visit on 11/24/2016  Component Date Value Ref Range Status  . POC Glucose 11/24/2016 89  70 - 99 mg/dl Final    Assessment:  Albert Sanders is a 51 y.o. 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  He was noted to  have abdominal adenopathy and mild splenomegaly on CT scan in 02/2015 following a fall.  He represented with abdominal pain, fever, and sweats.  Abdominal and pelvic CT scan without contrast on 07/25/2015 revealed abdominal adenopathy with the largest lymph node in the portacaval region measuring 1.9 cm (stable).  There was mild splenomegaly (15 cm).    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  Labs on 08/07/2015 revealed a hematocrit of 43, hemoglobin 13.9, platelets 200,000, white count 13,000 with an ANC of 500. Differential included 50% segs and 42% lymphs with a mild lymphocytosis (5500).  Comprehensive metabolic,  LDH, uric acid, sedimentation rate, SPEP, and immunoglobulins were normal.  PET scan on 08/07/2015 revealed no hypermetabolic lymphadenopathy or metastatic disease. There was a mildly enlarged gastrohepatic ligament, portacaval, and right external iliac lymph nodes (1.3-1.9 cm).  He had a top normal spleen size.   Bone marrow on 09/04/2015 revealed <10% involvement with B-cell lymphoproliferative neoplasm with a B-cell CLL/SLL immunophenotype.  There was mild non-specific dyserythropoiesis and mild increase in reticulin.  Storage iron was present. Flow cytometry revealed CD5+ monotypic (clonal) B cell population (20% of sample) with B-cell CLL/SLL immunophenotype.  Cytogenetics were normal (46, XY).  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  He has had infections.  He had a severe flexor tenosynovitis of the right 4th finger in 08/2015 after being bitten by a parrot.  He had debridement, antibiotics, and a skin graft.  He  had an infection overlying his right wrist recently (unroofed by patient).  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  Cystoscopy with retrograde pyelogram on 09/16/2015 revealed no abnormalities.  Testosterone was 113 [phone]) on 08/28/2015.    Symptomatically, he has chronic fatigue and intermittent sweats.  He has a low testosterone level.  He has no adenopathy or hepatosplenomegaly.  Plan: 1.  Labs today:  CBC with diff, CMP, uric acid, immunoglobulin levels.  2.  Discuss interim events and infections.  Discuss indications for treatment. 3.  Review FISH studies (P53 mutation). 4.  Follow-up with PCP regarding low testosterone and fatigue. 5.  RTC in 3-4 months for MD assessment and labs (CBC with diff, CMP, LDH, uric acid, immunoglobulin levels).   Lequita Asal, MD  02/02/2017, 5:45 AM

## 2017-02-17 ENCOUNTER — Inpatient Hospital Stay: Payer: Self-pay

## 2017-02-17 ENCOUNTER — Inpatient Hospital Stay: Payer: Self-pay | Admitting: Hematology and Oncology

## 2017-02-17 NOTE — Progress Notes (Deleted)
[facility] day:  02/17/17  Chief Complaint: Albert Sanders is a 51 y.o. male with chronic lymphocytic leukemia who is seen for 3 month assessment.  HPI: The patient was last seen in the medical oncology clinic on 09/29/2016.  At that time, he had chronic fatigue and intermittent sweats.  He had a low testosterone level.  Exam revealed no adenopathy or hepatosplenomegaly.  CBC revealed a hematocrit of 43.4, hemoglobin 14.2, MCV 82, platelets 185,000, WBC 12,900.  ANC was 6700.   he had chronic mild fatigue.  He had no B symptoms.  He was gaining a significant amount of weight (20 pounds in 3 weeks).  He had a low testosterone level.  Exam revealed no adenopathy or hepatosplenomegaly.  He was lost to follow-up.  He states that his insurance ran out.  He reports a bite to the distal right fourth finger by a parrot on 09/22/2015.  He was seen in the [facility] emergency room and prescribed antibiotics and pain medications. He represented a few days later with severe pain and a black finger. He was transported to a [facility] where his wound was debrided. He eventually obtained a skin graft. He has had no other infections.   Symptomatically, he notes sweats "from time to time". He had 2 episodes last week. Sometimes he feels cold.  He feels tired. He states that his testosterone is low (114). He denies any adenopathy, bruising or bleeding.  He had a knot on his right wrist recently. He use one of his testosterone needles to his express the pus from this area.  It is healing well.   Past Medical History:  Diagnosis Date  . Cancer ([place])    leukemia  . Clavicular fracture   . Complication of anesthesia    woke up during gallbladder surgery-pt states it takes alot of anesthesia to sedate him   . GERD (gastroesophageal reflux disease)   . Kidney stone   . Urinary calculus     Past Surgical History:  Procedure Laterality Date  . ANTERIOR CRUCIATE LIGAMENT  REPAIR    . BICEPS TENDON REPAIR Right   . BONE MARROW BIOPSY  08-2015  . CHOLECYSTECTOMY    . CYSTOSCOPY W/ RETROGRADES Bilateral 09/16/2015   Procedure: CYSTOSCOPY WITH RETROGRADE PYELOGRAM;  Surgeon: Nickie Retort, MD;  Location: [facility] ORS;  Service: Urology;  Laterality: Bilateral;    Family History  Problem Relation Age of Onset  . Diabetes Paternal Uncle   . Cancer Paternal Grandmother     breast  . Diabetes Paternal Grandfather   . Heart disease Neg Hx   . Hypertension Neg Hx   . Stroke Neg Hx   . COPD Neg Hx     Social History:  reports that he has never smoked. His smokeless tobacco use includes Snuff. He reports that he drinks alcohol. He reports that he does not use drugs.  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 He dips 1 can a day. He rarely drinks alcohol. 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   Allergies:  Allergies  Allergen Reactions  . Contrast Media [Iodinated Diagnostic Agents] Anaphylaxis  . Morphine Itching    Current Medications: Current Outpatient Prescriptions  Medication Sig Dispense Refill  . acetaminophen (TYLENOL) 500 MG tablet Take 500 mg by mouth.    Marland Kitchen omeprazole (PRILOSEC OTC) 20 MG tablet Take 1 tablet (20 mg total) by mouth every morning. 30 tablet 1  .  sildenafil (VIAGRA) 100 MG tablet Take 0.5-1 tablets (50-100 mg total) by mouth daily as needed for erectile dysfunction. 10 tablet 2   No current facility-administered medications for this visit.     Review of Systems:  GENERAL:  General fatigue.  No fever.  Off and on sweats.  Weight down 4 pounds since 08/01/2015. PERFORMANCE STATUS (ECOG):  0 HEENT:  No runny nose, mouth sores or tenderness. Lungs: No shortness of breath or cough.  No hemoptysis. Cardiac:  No chest pain, palpitations, orthopnea, or PND. GI:  No nausea, vomiting, diarrhea, melena or hematochezia. GU:  Low testosterone.  No urgency, frequency, dysuria, or hematuria. Musculoskeletal:  Chronic back issues.   No joint pain.  No muscle tenderness. Extremities:  No pain or swelling.  Interval parrot bite and infection (see HPI). Skin:  No rashes or skin changes. Neuro:  No headache, numbness or weakness, balance or coordination issues. Endocrine:  No diabetes, thyroid issues, hot flashes or night sweats. Psych:  No mood changes, depression or anxiety. Pain:  No focal pain. Review of systems:  All other systems reviewed and found to be negative.  Physical Exam: There were no vitals taken for this visit. GENERAL:  Well developed, well nourished, heavy set gentleman sitting comfortably in the exam room in no acute distress. MENTAL STATUS:  Alert and oriented to person, place and time. HEAD:  Wearing a [place] [employer] cap.  Crew cut/shaved hair.  Normocephalic, atraumatic, face symmetric, no Cushingoid features. EYES:  Blue eyes.  Pupils equal round and reactive to light and accomodation.  No conjunctivitis or scleral icterus. ENT:  Oropharynx clear without lesion.  Tongue normal. Mucous membranes moist.  RESPIRATORY:  Clear to auscultation without rales, wheezes or rhonchi. CARDIOVASCULAR:  Regular rate and rhythm without murmur, rub or gallop. ABDOMEN:  Fully round.  Soft, non-tender, with active bowel sounds, and no appreciable hepatosplenomegaly.  No masses. SKIN:  No rashes, ulcers or lesions. EXTREMITIES: Dorsal surface of right wist with deep cavitary lesion (dry).  No edema, no skin discoloration or tenderness.  No palpable cords. LYMPH NODES: No palpable cervical, supraclavicular, axillary or inguinal adenopathy  NEUROLOGICAL: Unremarkable. PSYCH:  Appropriate.    No visits with results within 3 Day(s) from this visit.  Latest known visit with results is:  Office Visit on 11/24/2016  Component Date Value Ref Range Status  . POC Glucose 11/24/2016 89  70 - 99 mg/dl Final    Assessment:  Albert Sanders is a 51 y.o. 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  He was noted to  have abdominal adenopathy and mild splenomegaly on CT scan in 02/2015 following a fall.  He represented with abdominal pain, fever, and sweats.  Abdominal and pelvic CT scan without contrast on 07/25/2015 revealed abdominal adenopathy with the largest lymph node in the portacaval region measuring 1.9 cm (stable).  There was mild splenomegaly (15 cm).    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  Labs on 08/07/2015 revealed a hematocrit of 43, hemoglobin 13.9, platelets 200,000, white count 13,000 with an ANC of 500. Differential included 50% segs and 42% lymphs with a mild lymphocytosis (5500).  Comprehensive metabolic,  LDH, uric acid, sedimentation rate, SPEP, and immunoglobulins were normal.  PET scan on 08/07/2015 revealed no hypermetabolic lymphadenopathy or metastatic disease. There was a mildly enlarged gastrohepatic ligament, portacaval, and right external iliac lymph nodes (1.3-1.9 cm).  He had a top normal spleen size.   Bone marrow on 09/04/2015 revealed <10% involvement with B-cell lymphoproliferative neoplasm with a B-cell CLL/SLL immunophenotype.  There was mild non-specific dyserythropoiesis and mild increase in reticulin.  Storage iron was present. Flow cytometry revealed CD5+ monotypic (clonal) B cell population (20% of sample) with B-cell CLL/SLL immunophenotype.  Cytogenetics were normal (46, XY).  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  He has had infections.  He had a severe flexor tenosynovitis of the right 4th finger in 08/2015 after being bitten by a parrot.  He had debridement, antibiotics, and a skin graft.  He  had an infection overlying his right wrist recently (unroofed by patient).  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  Cystoscopy with retrograde pyelogram on 09/16/2015 revealed no abnormalities.  Testosterone was 113 [phone]) on 08/28/2015.    Symptomatically, he has chronic fatigue and intermittent sweats.  He has a low testosterone level.  He has no adenopathy or hepatosplenomegaly.  Plan: 1.  Labs today:  CBC with diff, CMP, uric acid, immunoglobulin levels.  2.  Discuss interim events and infections.  Discuss indications for treatment. 3.  Review FISH studies (P53 mutation). 4.  Follow-up with PCP regarding low testosterone and fatigue. 5.  RTC in 3-4 months for MD assessment and labs (CBC with diff, CMP, LDH, uric acid, immunoglobulin levels).   Lequita Asal, MD  02/17/2017, 6:11 AM

## 2017-02-23 ENCOUNTER — Ambulatory Visit: Payer: PRIVATE HEALTH INSURANCE | Admitting: Family Medicine

## 2017-05-30 NOTE — Telephone Encounter (Signed)
done

## 2017-10-03 DIAGNOSIS — M179 Osteoarthritis of knee, unspecified: Secondary | ICD-10-CM | POA: Insufficient documentation

## 2017-10-03 DIAGNOSIS — M1710 Unilateral primary osteoarthritis, unspecified knee: Secondary | ICD-10-CM | POA: Insufficient documentation

## 2017-10-03 DIAGNOSIS — S86919A Strain of unspecified muscle(s) and tendon(s) at lower leg level, unspecified leg, initial encounter: Secondary | ICD-10-CM | POA: Insufficient documentation

## 2017-10-30 ENCOUNTER — Telehealth: Payer: Self-pay

## 2017-10-30 NOTE — Telephone Encounter (Signed)
Tried contacting patient to schedule appt. Was not able to leave voice message due to vm not being set up.

## 2017-10-30 NOTE — Telephone Encounter (Signed)
Please call pt and have him scheduled for pre op exam for [facility]. Thanks

## 2017-10-31 NOTE — Telephone Encounter (Signed)
Spoke with patient and appointment has been made for 11/06/17

## 2017-11-06 ENCOUNTER — Ambulatory Visit: Payer: PRIVATE HEALTH INSURANCE | Admitting: Family Medicine

## 2017-11-07 ENCOUNTER — Ambulatory Visit (INDEPENDENT_AMBULATORY_CARE_PROVIDER_SITE_OTHER): Payer: Self-pay | Admitting: Family Medicine

## 2017-11-07 ENCOUNTER — Encounter: Payer: Self-pay | Admitting: Family Medicine

## 2017-11-07 VITALS — BP 118/70 | HR 95 | Temp 98.0°F | Ht 72.0 in | Wt 290.8 lb

## 2017-11-07 DIAGNOSIS — Z01818 Encounter for other preprocedural examination: Secondary | ICD-10-CM

## 2017-11-07 DIAGNOSIS — C9110 Chronic lymphocytic leukemia of B-cell type not having achieved remission: Secondary | ICD-10-CM

## 2017-11-07 DIAGNOSIS — Z6839 Body mass index (BMI) 39.0-39.9, adult: Secondary | ICD-10-CM

## 2017-11-07 DIAGNOSIS — N182 Chronic kidney disease, stage 2 (mild): Secondary | ICD-10-CM

## 2017-11-07 DIAGNOSIS — Z1211 Encounter for screening for malignant neoplasm of colon: Secondary | ICD-10-CM

## 2017-11-07 DIAGNOSIS — N529 Male erectile dysfunction, unspecified: Secondary | ICD-10-CM

## 2017-11-07 DIAGNOSIS — E6609 Other obesity due to excess calories: Secondary | ICD-10-CM

## 2017-11-07 DIAGNOSIS — Z7189 Other specified counseling: Secondary | ICD-10-CM

## 2017-11-07 DIAGNOSIS — C9190 Lymphoid leukemia, unspecified not having achieved remission: Secondary | ICD-10-CM

## 2017-11-07 DIAGNOSIS — R0683 Snoring: Secondary | ICD-10-CM

## 2017-11-07 MED ORDER — SILDENAFIL CITRATE 20 MG PO TABS: 20.0000 mg | ORAL_TABLET | Freq: Every day | ORAL | 0 refills | Status: DC | PRN

## 2017-11-07 NOTE — Assessment & Plan Note (Signed)
Cardiologist advised him last year to have his sleep study rescheduled; patient has unfortunately not done that yet; I urged him to work on weight loss as well

## 2017-11-07 NOTE — Assessment & Plan Note (Signed)
I reviewed the [place] web site for the last 2 years; see copy in the chart; I openly and non-judgmentally shared this information with the patient today, including his increased risk of fatal accidental overdose using the overdose risk score; he did not want to talk about the [place] from 2017; I encouraged him to be open with his surgeon and discuss pain management strategies for post-op care to avoid any risk of addiction or escalation of pain medicine; we are here to care for him and want him to feel that he can be open and honest with [place] (his providers) so we can provide the best care; I also discussed the STOP ACT and current guidelines for pain medicine prescribing

## 2017-11-07 NOTE — Assessment & Plan Note (Signed)
Patent suspects this is from low testosterone; explained marker of heart disease
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
## 2017-11-07 NOTE — Assessment & Plan Note (Signed)
See form

## 2017-11-07 NOTE — Assessment & Plan Note (Addendum)
Encouraged weight loss; I showed him that his BMI is less than one point away from "morbid obesity" range and encouraged him to lose weight over the coming year

## 2017-11-07 NOTE — Assessment & Plan Note (Signed)
Last GFR was 87; will check creatinine today

## 2017-11-07 NOTE — Patient Instructions (Signed)
We'll get labs today We'll refer you to the cardiologist for pre-op clearance, given the erectile dysfunction Check out the information at familydoctor.org entitled "Nutrition for Weight Loss: What You Need to Know about Fad Diets" Try to lose between 1-2 pounds per week by taking in fewer calories and burning off more calories You can succeed by limiting portions, limiting foods dense in calories and fat, becoming more active, and drinking 8 glasses of water a day (64 ounces) Don't skip meals, especially breakfast, as skipping meals may alter your metabolism Do not use over-the-counter weight loss pills or gimmicks that claim rapid weight loss A healthy BMI (or body mass index) is between 18.5 and 24.9 You can calculate your ideal BMI at the [place] website [employer]fr

## 2017-11-07 NOTE — Assessment & Plan Note (Signed)
Will check CBC today; patient has not been back to see his heme-onc specialist in over a year

## 2017-11-08 ENCOUNTER — Encounter: Payer: Self-pay | Admitting: Family Medicine

## 2017-11-08 ENCOUNTER — Ambulatory Visit: Payer: PRIVATE HEALTH INSURANCE | Admitting: Family Medicine

## 2017-11-08 DIAGNOSIS — R7303 Prediabetes: Secondary | ICD-10-CM | POA: Insufficient documentation

## 2017-11-08 LAB — CBC WITH DIFFERENTIAL/PLATELET
Basophils Absolute: 65 cells/uL (ref 0–200)
Basophils Relative: 0.4 %
Eosinophils Absolute: 130 cells/uL (ref 15–500)
Eosinophils Relative: 0.8 %
HCT: 45.6 % (ref 38.5–50.0)
Hemoglobin: 14.8 g/dL (ref 13.2–17.1)
Lymphs Abs: 7970 cells/uL — ABNORMAL HIGH (ref 850–3900)
MCH: 26 pg — ABNORMAL LOW (ref 27.0–33.0)
MCHC: 32.5 g/dL (ref 32.0–36.0)
MCV: 80 fL (ref 80.0–100.0)
MPV: 11.1 fL (ref 7.5–12.5)
Monocytes Relative: 6.1 %
Neutro Abs: 7047 cells/uL (ref 1500–7800)
Neutrophils Relative %: 43.5 %
Platelets: 250 10*3/uL (ref 140–400)
RBC: 5.7 10*6/uL (ref 4.20–5.80)
RDW: 13.3 % (ref 11.0–15.0)
Total Lymphocyte: 49.2 %
WBC mixed population: 988 cells/uL — ABNORMAL HIGH (ref 200–950)
WBC: 16.2 10*3/uL — ABNORMAL HIGH (ref 3.8–10.8)

## 2017-11-08 LAB — COMPLETE METABOLIC PANEL WITH GFR
AG Ratio: 1.5 (calc) (ref 1.0–2.5)
ALT: 30 U/L (ref 9–46)
AST: 19 U/L (ref 10–35)
Albumin: 4.4 g/dL (ref 3.6–5.1)
Alkaline phosphatase (APISO): 90 U/L (ref 40–115)
BUN: 20 mg/dL (ref 7–25)
CO2: 30 mmol/L (ref 20–32)
Calcium: 9.5 mg/dL (ref 8.6–10.3)
Chloride: 102 mmol/L (ref 98–110)
Creat: 0.86 mg/dL (ref 0.70–1.33)
GFR, Est African American: 117 mL/min/{1.73_m2} (ref >=60)
GFR, Est Non African American: 101 mL/min/{1.73_m2} (ref >=60)
Globulin: 3 g/dL (calc) (ref 1.9–3.7)
Glucose, Bld: 87 mg/dL (ref 65–99)
Potassium: 4.3 mmol/L (ref 3.5–5.3)
Sodium: 139 mmol/L (ref 135–146)
Total Bilirubin: 0.5 mg/dL (ref 0.2–1.2)
Total Protein: 7.4 g/dL (ref 6.1–8.1)

## 2017-11-08 LAB — URINALYSIS W MICROSCOPIC + REFLEX CULTURE
Bacteria, UA: NONE SEEN /HPF
Bilirubin Urine: NEGATIVE
Glucose, UA: NEGATIVE
Hgb urine dipstick: NEGATIVE
Hyaline Cast: NONE SEEN /LPF
Ketones, ur: NEGATIVE
Leukocyte Esterase: NEGATIVE
Nitrites, Initial: NEGATIVE
Protein, ur: NEGATIVE
RBC / HPF: NONE SEEN /HPF (ref 0–2)
Specific Gravity, Urine: 1.024 (ref 1.001–1.03)
Squamous Epithelial / LPF: NONE SEEN /HPF (ref <=5)
WBC, UA: NONE SEEN /HPF (ref 0–5)
pH: <=5 (ref 5.0–8.0)

## 2017-11-08 LAB — HEMOGLOBIN A1C
EAG (MMOL/L): 6.5 (calc)
Hgb A1c MFr Bld: 5.7 % of total Hgb — ABNORMAL HIGH (ref <5.7)
Mean Plasma Glucose: 117 (calc)

## 2017-11-08 LAB — NO CULTURE INDICATED

## 2017-11-09 ENCOUNTER — Telehealth: Payer: Self-pay

## 2017-11-09 NOTE — Telephone Encounter (Signed)
Copied from [place] #[id number]. Topic: Inquiry >> Nov 08, 2017  1:02 PM Oliver Pila B wrote: Reason for CRM: pt wanted a phone call from the practice needed to discuss a concern, pt did not specify

## 2017-11-10 NOTE — Telephone Encounter (Signed)
2nd attempt left detailed voicemail

## 2017-11-14 ENCOUNTER — Ambulatory Visit: Payer: Self-pay | Admitting: Nurse Practitioner

## 2017-11-14 ENCOUNTER — Other Ambulatory Visit: Payer: Self-pay | Admitting: Hematology and Oncology

## 2017-11-17 ENCOUNTER — Telehealth: Payer: Self-pay

## 2017-11-17 ENCOUNTER — Telehealth: Payer: Self-pay | Admitting: Family Medicine

## 2017-11-17 NOTE — Telephone Encounter (Signed)
Received incoming call from pt stating that he is not planning to do the necessary things needed to get his pre-op forms completed. He wants to know if the provider would be willing to sign his clearance forms without having the needed appts (cardiologist, sleep study ect) completed. Also of note pt states that he believes his weight was entered incorrectly. Offered pt to come back in and have weight repeated.

## 2017-11-17 NOTE — Telephone Encounter (Signed)
I cannot provide surgical clearance for this patient He did not keep follow-up with his heme-one doctor for his leukemia He did not keep the appointment to see the cardiologist If they operate on him, it is without my express clearance because of his noncompliance and health issues I cannot clear him for surgery

## 2017-11-17 NOTE — Telephone Encounter (Signed)
-----   Message from Dennard Schaumann, [place] sent at 11/17/2017 11:17 AM EST ----- Dr. Edythe Lynn with [facility] would like for you to give her a call regarding this patient and his surgical clearance.  Please call her at [phone] ext [phone].

## 2017-11-17 NOTE — Telephone Encounter (Signed)
He did not see his heme-onc doctor for his leukemia; his WBC was 16.2k He did not keep the appointment with the cardiologist (no show, 11/14/17) I spoke with the patient at his request I explained that I am not going to provide medical clearance to him It will be entirely up to him and his surgeon if the surgeon wishes to proceed, but I cannot provide medical clearance and sign off on him and accept any responsibility if something happens I do not give clearance He will call his surgeon

## 2017-11-17 NOTE — Telephone Encounter (Signed)
° °  [facility] Pre-operative Risk Assessment    Request for surgical clearance:  1. What type of surgery is being performed?  Left Knee Hardware Removal   2. When is this surgery scheduled? 01-08-18  3. Are there any medications that need to be held prior to surgery and how long? Unknown   4. Practice name and name of physician performing surgery? [facility] Dr. Kurtis Bushman   5. What is your office phone and fax number? Tel [phone] Fax [phone]  6. Anesthesia type (None, local, MAC, general) ? Block    _________________________________________________________________   (provider comments below)

## 2017-11-17 NOTE — Telephone Encounter (Signed)
Pt scheduled for Feb 11 left knee hardware removal; needs cardiac clearance. Pt saw Dr. Yvone Neu 12/17. He did not proceed with echo or nuc med study.  Pt reports feeling fine but Dr. Sanda Klein wants him to have cardiac clearance. He would like a sooner appt and apologized for not showing for scheduled Dec 18 OV w/Chris Sharolyn Douglas, NP. Placed on wait list per pt request.

## 2017-11-17 NOTE — Telephone Encounter (Signed)
I spoke with Judeen Hammans The surgery is actually going to be with a block and small incision; she just wanted me to be aware of that I read her my note from earlier; the type of anesthesia does not change my clearance I still cannot give clearance Anything they chose to do is without my clearance If they decide to have proceed with the patient signing something to accept responsibility if something happens, that is totally up to them, but I cannot be responsible and give clearance Explained to Judeen Hammans his previous chest pain, visit to Dr. Yvone Neu, need for stress test and echo and sleep study; he never followed through so all of that is not done He also has leukemia; may affect wound healing, infection; want surgeon to be aware of that; she says he is aware of the leukemia

## 2017-11-20 NOTE — Telephone Encounter (Signed)
appt 12/21/17 with Thurmond Butts, [place].

## 2017-11-24 ENCOUNTER — Encounter: Payer: Self-pay | Admitting: *Deleted

## 2017-11-24 ENCOUNTER — Ambulatory Visit (INDEPENDENT_AMBULATORY_CARE_PROVIDER_SITE_OTHER): Payer: Worker's Compensation | Admitting: Physician Assistant

## 2017-11-24 ENCOUNTER — Encounter: Payer: Self-pay | Admitting: Physician Assistant

## 2017-11-24 VITALS — BP 120/66 | HR 84 | Ht 73.0 in | Wt 285.0 lb

## 2017-11-24 DIAGNOSIS — K219 Gastro-esophageal reflux disease without esophagitis: Secondary | ICD-10-CM

## 2017-11-24 DIAGNOSIS — I208 Other forms of angina pectoris: Secondary | ICD-10-CM

## 2017-11-24 DIAGNOSIS — R079 Chest pain, unspecified: Secondary | ICD-10-CM

## 2017-11-24 DIAGNOSIS — C9190 Lymphoid leukemia, unspecified not having achieved remission: Secondary | ICD-10-CM | POA: Diagnosis not present

## 2017-11-24 DIAGNOSIS — Z01810 Encounter for preprocedural cardiovascular examination: Secondary | ICD-10-CM

## 2017-11-24 DIAGNOSIS — C9110 Chronic lymphocytic leukemia of B-cell type not having achieved remission: Secondary | ICD-10-CM

## 2017-11-24 NOTE — Patient Instructions (Addendum)
Medication Instructions: - Your physician recommends that you continue on your current medications as directed. Please refer to the Current Medication list given to you today.  Labwork: - none ordered  Procedures/Testing: - Your physician has requested that you have an exercise stress myoview.   [place]  Your caregiver has ordered a Stress Test with nuclear imaging. The purpose of this test is to evaluate the blood supply to your heart muscle. This procedure is referred to as a "Non-Invasive Stress Test." This is because other than having an IV started in your vein, nothing is inserted or "invades" your body. Cardiac stress tests are done to find areas of poor blood flow to the heart by determining the extent of coronary artery disease (CAD). Some patients exercise on a treadmill, which naturally increases the blood flow to your heart, while others who are  unable to walk on a treadmill due to physical limitations have a pharmacologic/chemical stress agent called Lexiscan . This medicine will mimic walking on a treadmill by temporarily increasing your coronary blood flow.   Please note: these test may take anywhere between 2-4 hours to complete  PLEASE REPORT TO [facility] AT THE FIRST DESK WILL DIRECT YOU WHERE TO GO  Date of Procedure:________Monday 12/31/18_____________________________  Arrival Time for Procedure:______8:45 am________________________  Instructions regarding medication:   - You may take all of your regular medications the morning of the test with enough water to get them down safely  PLEASE NOTIFY THE OFFICE AT LEAST 24 HOURS IN ADVANCE IF YOU ARE UNABLE TO [place].  [phone] AND  PLEASE NOTIFY NUCLEAR MEDICINE AT [facility] AT LEAST 24 HOURS IN ADVANCE IF YOU ARE UNABLE TO KEEP YOUR APPOINTMENT. [phone]  How to prepare for your Myoview test:  1. Do not eat or drink after midnight 2. No caffeine for 24 hours  prior to test 3. No smoking 24 hours prior to test. 4. Your medication may be taken with water.  If your doctor stopped a medication because of this test, do not take that medication. 5. Ladies, please do not wear dresses.  Skirts or pants are appropriate. Please wear a short sleeve shirt. 6. No perfume, cologne or lotion. 7. Wear comfortable walking shoes. No heels!   Follow-Up: - Your physician recommends that you schedule a follow-up appointment in: 3 months with Dr. Saunders Revel.   Any Additional Special Instructions Will Be Listed Below (If Applicable).     If you need a refill on your cardiac medications before your next appointment, please call your pharmacy.

## 2017-11-24 NOTE — Progress Notes (Signed)
Cardiology Office Note Date:  11/24/2017  Patient ID:  Albert, Sanders September 08, 1966, MRN [id number] PCP:  Arnetha Courser, MD  Cardiologist:  Formerly, Dr. Yvone Neu, MD    Chief Complaint: Preoperative cardiac evaluation   History of Present Illness: Albert Sanders is a 51 y.o. male with history of CLL, prior remote cocaine abuse, morbid obesity, low back pain, nephrolithiasis, snoring, ED, and GERD who presents for pre-operative cardiac evaluation.   He was previously seen by Dr. Yvone Neu on 11/25/2016 for evaluation of chest pain, SOB, and fatigue for ~ 6-12 months at that time. He had previously undergone CTA abdomen in 04/2016 for LUQ pain that showed splenomegaly with mildly enlarged lymph nodes consistent with CLL. Exam also showed mild cardiomegaly with a dilated left coronary sinus. Follow up chest MRA in 04/2016 at [facility] for evaluation of his chest/LUQ pain and splenomegaly was negative for PE and noted mild splenomegaly and borderline enlarged right axillary lymph node. At his visit in 10/2016, his chest pain and SOB were associated with exertion, described as non-radiating and discomfort-like, lasting minutes at a time and resolved with rest. Stress test and echocardiogram were recommended and ordered, but were not completed by the patient. He reports "I just felt like I didn't need those tests." He was also advised to undergo a sleep study which has not been completed.   He is scheduled for left knee hardware removal and PCP has requested cardiac clearance.   He comes in today doing well, though has noted a worsening of his reflux with increased belching over the past month. He does report an improvement in his chest pain but continues to note SOB and is fatigued during the day. Works as a Development worker, community. Occasionally notes working reflux and belching when exerting himself. Currently, symptom free. Able to achieve > 4 METs.     Past Medical History:  Diagnosis Date  . Clavicular fracture     . CLL (chronic lymphocytic leukemia) ([place])   . Complication of anesthesia    woke up during gallbladder surgery-pt states it takes alot of anesthesia to sedate him   . Erectile dysfunction   . GERD (gastroesophageal reflux disease)   . Morbid obesity ([place])   . Urinary calculus     Past Surgical History:  Procedure Laterality Date  . ANTERIOR CRUCIATE LIGAMENT REPAIR    . BICEPS TENDON REPAIR Right   . BONE MARROW BIOPSY  08-2015  . CHOLECYSTECTOMY    . CYSTOSCOPY W/ RETROGRADES Bilateral 09/16/2015   Procedure: CYSTOSCOPY WITH RETROGRADE PYELOGRAM;  Surgeon: Nickie Retort, MD;  Location: [facility] ORS;  Service: Urology;  Laterality: Bilateral;    Current Meds  Medication Sig  . acetaminophen (TYLENOL) 500 MG tablet Take 500 mg by mouth.  Marland Kitchen omeprazole (PRILOSEC OTC) 20 MG tablet Take 1 tablet (20 mg total) by mouth every morning.  . sildenafil (REVATIO) 20 MG tablet Take 1-2 tablets (20-40 mg total) by mouth daily as needed.  . traMADol (ULTRAM) 50 MG tablet tramadol 50 mg tablet  Take 1 tablet every 4 hours by oral route.    Allergies:   Contrast media [iodinated diagnostic agents]   Social History:  The patient  reports that  has never smoked. His smokeless tobacco use includes snuff. He reports that he drinks alcohol. He reports that he does not use drugs.   Family History:  The patient's family history includes Cancer in his paternal grandmother; Diabetes in his paternal grandfather and paternal uncle.  ROS:   Review of Systems  Constitutional: Positive for malaise/fatigue. Negative for chills, diaphoresis, fever and weight loss.  HENT: Negative for congestion.   Eyes: Negative for discharge and redness.  Respiratory: Positive for shortness of breath. Negative for cough, hemoptysis, sputum production and wheezing.   Cardiovascular: Negative for chest pain, palpitations, orthopnea, claudication, leg swelling and PND.  Gastrointestinal: Positive for heartburn. Negative  for abdominal pain, blood in stool, melena, nausea and vomiting.  Genitourinary: Negative for hematuria.  Musculoskeletal: Positive for joint pain. Negative for falls and myalgias.  Skin: Negative for rash.  Neurological: Negative for dizziness, tingling, tremors, sensory change, speech change, focal weakness, loss of consciousness and weakness.  Endo/Heme/Allergies: Does not bruise/bleed easily.  Psychiatric/Behavioral: Positive for substance abuse. The patient is not nervous/anxious.   All other systems reviewed and are negative.    PHYSICAL EXAM:  VS:  BP 120/66 (BP Location: Left Arm, Patient Position: Sitting, Cuff Size: Large)   Pulse 84   Ht _0  (1.854 m)   Wt 285 lb (129.3 kg)   BMI 37.60 kg/m  BMI: Body mass index is 37.6 kg/m.  Physical Exam  Constitutional: He is oriented to person, place, and time. He appears well-developed and well-nourished.  HENT:  Head: Normocephalic and atraumatic.  Eyes: Right eye exhibits no discharge. Left eye exhibits no discharge.  Neck: Normal range of motion. No JVD present.  Cardiovascular: Normal rate, regular rhythm, S1 normal, S2 normal and normal heart sounds. Exam reveals no distant heart sounds, no friction rub, no midsystolic click and no opening snap.  No murmur heard. Pulses:      Posterior tibial pulses are 2+ on the right side, and 2+ on the left side.  Pulmonary/Chest: Effort normal and breath sounds normal. No respiratory distress. He has no decreased breath sounds. He has no wheezes. He has no rales. He exhibits no tenderness.  Abdominal: Soft. He exhibits no distension. There is no tenderness.  Obese  Musculoskeletal: He exhibits no edema.  Neurological: He is alert and oriented to person, place, and time.  Skin: Skin is warm and dry. No cyanosis. Nails show no clubbing.  Psychiatric: He has a normal mood and affect. His speech is normal and behavior is normal. Judgment and thought content normal.     EKG:  Was ordered  and interpreted by me today. Shows NSR, 84 bpm, rare PVC, no acute st/t changes  Recent Labs: 11/07/2017: ALT 30; BUN 20; Creat 0.86; Hemoglobin 14.8; Platelets 250; Potassium 4.3; Sodium 139  No results found for requested labs within last 8760 hours.   Estimated Creatinine Clearance: 144.9 mL/min (by C-G formula based on SCr of 0.86 mg/dL).   Wt Readings from Last 3 Encounters:  11/24/17 285 lb (129.3 kg)  11/07/17 290 lb 12.8 oz (131.9 kg)  11/25/16 292 lb (132.5 kg)     Other studies reviewed: Additional studies/records reviewed today include: summarized above  ASSESSMENT AND PLAN:  1. Chest pain with moderate risk of cardiac etiology: -Currently chest pain free -Schedule treadmill Myoview to evaluate for high risk ischemia -Discussed with patient I cannot accurately assess him for non-cardiac surgery without a stress test given his prior episodes of chest pain/SOB with recommendation of stress testing and current reflux-like symptoms that may in fact be his anginal equivalent  -Recommend echo pending stress test results -Risk factors include prior cocaine abuse, morbid obesity, and male -Of note, patient decided to take a phone call near the end of our visit today  2. Preoperative cardiac evaluation: -Schedule stress test as above (to be performed on 11/27/17) -Further assessment of his cardiac risk pending at this time -He has only been assessed from a cardiac perspective PREOPERATIVE CARDIAC RISK ASSESSMENT:   Revised Cardiac Risk Index:  High Risk Surgery (defined as Intraperitoneal, intrathoracic or suprainguinal vascular): low; (left knee hardware removal)  Active CAD: yes; (reflux is a possible anginal equivalent)  CHF: no  Cerebrovascular Disease: no   Diabetes: no; On Insulin: no  CKD (Cr >~ 2): no   Total: 1 Estimated Risk of Adverse Outcome: low risk for low risk surgery. Estimated Risk of MI, PE, VF/VT (Cardiac Arrest), Complete Heart Block: 0.9  %   ACC/AHA Guidelines for "Clearance":  Step 1 - Need for Emergency Surgery: no   If Yes - go straight to OR with perioperative surveillance  Step 2 - Active Cardiac Conditions (Unstable Angina, Decompensated HF, Significant  Arrhytmias - Complete HB, Mobitz II, Symptomatic VT or SVT, Severe Aortic Stenosis - mean gradient > 40 mmHg, Valve area < 1.0 cm2): reflux being possible anginal equivalent    If Yes - Evaluate & Treat per ACC/AHA Guidelines  Step 3 -  Low Risk Surgery: yes  If Yes --> proceed to OR  If No --> Step 4  Step 4 - Functional Capacity >= 4 METS without symptoms: yes  If Yes --> proceed to OR  If No --> Step 5  Step 5 --  Clinical Risk Factors (CRF)  - Zero --> proceed to OR  3. CLL: -Recent CBC noted -Agree with recommendation that he schedule an appointment with Hem/Onc for medical surgical clearance  4. Morbid obesity/snoring: -Agree with rescheduling of sleep study  5. Hyperglycemia:  -Followed by PCP  Disposition: F/u with Dr. Saunders Revel in 3 months  Current medicines are reviewed at length with the patient today.  The patient did not have any concerns regarding medicines.  Melvern Banker PA-C 11/24/2017 2:29 PM     [address] [place], [place] [address] [phone]

## 2017-11-27 ENCOUNTER — Ambulatory Visit
Admission: RE | Admit: 2017-11-27 | Discharge: 2017-11-27 | Disposition: A | Discharge status: Home / Self Care | Payer: Worker's Compensation | Source: Ambulatory Visit | Attending: Physician Assistant | Admitting: Physician Assistant

## 2017-11-27 DIAGNOSIS — R079 Chest pain, unspecified: Secondary | ICD-10-CM | POA: Diagnosis not present

## 2017-11-27 LAB — NM MYOCAR MULTI W/SPECT W/WALL MOTION / EF
CHL CUP NUCLEAR SRS: 0
CHL CUP RESTING HR STRESS: 65 {beats}/min
CSEPEW: 7 METS
CSEPHR: 90 %
CSEPPHR: 153 {beats}/min
Exercise duration (min): 5 min
Exercise duration (sec): 51 s
LV dias vol: 151 mL (ref 62–150)
LVSYSVOL: 73 mL
SDS: 2
SSS: 4
TID: 0.89

## 2017-11-27 MED ORDER — TECHNETIUM TC 99M TETROFOSMIN IV KIT
13.0000 | PACK | Freq: Once | INTRAVENOUS | Status: AC | PRN
  Administered 2017-11-27: 12.91 via INTRAVENOUS

## 2017-11-27 MED ORDER — TECHNETIUM TC 99M TETROFOSMIN IV KIT
30.0000 | PACK | Freq: Once | INTRAVENOUS | Status: AC | PRN
  Administered 2017-11-27: 30.353 via INTRAVENOUS

## 2017-12-13 ENCOUNTER — Encounter: Payer: Self-pay | Admitting: Physician Assistant

## 2017-12-13 NOTE — Telephone Encounter (Signed)
This encounter was created in error - please disregard.

## 2017-12-13 NOTE — Telephone Encounter (Signed)
Please fax clearance form to [phone]

## 2017-12-13 NOTE — Telephone Encounter (Signed)
Clearance routed over to number listed via Epic.

## 2017-12-14 ENCOUNTER — Telehealth: Payer: Self-pay | Admitting: *Deleted

## 2017-12-14 NOTE — Telephone Encounter (Signed)
Patient is scheduled on 12/22/17 for Lab/MD  He stated date and time would suit him just fine. Due to his surgery being on 01/08/18. Also a reminder letter will be sent out as well.

## 2017-12-15 ENCOUNTER — Telehealth: Payer: Self-pay | Admitting: Hematology and Oncology

## 2017-12-15 NOTE — Telephone Encounter (Signed)
  We have not seen him in awhile.  I believe he is on the schedule to be seen.  M

## 2017-12-21 ENCOUNTER — Other Ambulatory Visit: Payer: Self-pay | Admitting: *Deleted

## 2017-12-21 ENCOUNTER — Ambulatory Visit: Payer: Self-pay | Admitting: Physician Assistant

## 2017-12-21 DIAGNOSIS — C9110 Chronic lymphocytic leukemia of B-cell type not having achieved remission: Secondary | ICD-10-CM

## 2017-12-22 ENCOUNTER — Inpatient Hospital Stay: Payer: Self-pay | Attending: Hematology and Oncology

## 2017-12-22 ENCOUNTER — Inpatient Hospital Stay (HOSPITAL_BASED_OUTPATIENT_CLINIC_OR_DEPARTMENT_OTHER): Payer: Self-pay | Admitting: Hematology and Oncology

## 2017-12-22 ENCOUNTER — Encounter: Payer: Self-pay | Admitting: Urgent Care

## 2017-12-22 VITALS — BP 117/80 | HR 79 | Temp 97.0°F | Resp 20 | Wt 273.5 lb

## 2017-12-22 DIAGNOSIS — C9110 Chronic lymphocytic leukemia of B-cell type not having achieved remission: Secondary | ICD-10-CM | POA: Insufficient documentation

## 2017-12-22 LAB — CBC WITH DIFFERENTIAL/PLATELET
Basophils Absolute: 0.3 10*3/uL — ABNORMAL HIGH (ref 0–0.1)
Basophils Relative: 2 %
Eosinophils Absolute: 0.1 10*3/uL (ref 0–0.7)
Eosinophils Relative: 1 %
HCT: 44.3 % (ref 40.0–52.0)
Hemoglobin: 14.2 g/dL (ref 13.0–18.0)
Lymphocytes Relative: 38 %
Lymphs Abs: 4.5 10*3/uL — ABNORMAL HIGH (ref 1.0–3.6)
MCH: 26 pg (ref 26.0–34.0)
MCHC: 32.1 g/dL (ref 32.0–36.0)
MCV: 80.9 fL (ref 80.0–100.0)
Monocytes Absolute: 0.7 10*3/uL (ref 0.2–1.0)
Monocytes Relative: 6 %
Neutro Abs: 6.3 10*3/uL (ref 1.4–6.5)
Neutrophils Relative %: 53 %
Platelets: 201 10*3/uL (ref 150–440)
RBC: 5.47 MIL/uL (ref 4.40–5.90)
RDW: 14.1 % (ref 11.5–14.5)
WBC: 12 10*3/uL — ABNORMAL HIGH (ref 3.8–10.6)

## 2017-12-22 LAB — COMPREHENSIVE METABOLIC PANEL
ALT: 44 U/L (ref 17–63)
AST: 31 U/L (ref 15–41)
Albumin: 4.2 g/dL (ref 3.5–5.0)
Alkaline Phosphatase: 89 U/L (ref 38–126)
Anion gap: 7 (ref 5–15)
BUN: 15 mg/dL (ref 6–20)
CO2: 29 mmol/L (ref 22–32)
Calcium: 9.3 mg/dL (ref 8.9–10.3)
Chloride: 98 mmol/L — ABNORMAL LOW (ref 101–111)
Creatinine, Ser: 0.78 mg/dL (ref 0.61–1.24)
GFR calc Af Amer: >60 mL/min (ref >60)
GFR calc non Af Amer: >60 mL/min (ref >60)
Glucose, Bld: 122 mg/dL — ABNORMAL HIGH (ref 65–99)
Potassium: 4.2 mmol/L (ref 3.5–5.1)
Sodium: 134 mmol/L — ABNORMAL LOW (ref 135–145)
Total Bilirubin: 1 mg/dL (ref 0.3–1.2)
Total Protein: 7.5 g/dL (ref 6.5–8.1)

## 2017-12-22 LAB — LACTATE DEHYDROGENASE: LDH: 115 U/L (ref 98–192)

## 2017-12-22 LAB — URIC ACID: Uric Acid, Serum: 6 mg/dL (ref 4.4–7.6)

## 2017-12-22 NOTE — Progress Notes (Signed)
Patient here today for surgical clearance.  Offers no complaints.

## 2017-12-22 NOTE — Progress Notes (Signed)
[facility] day:  12/22/17  Chief Complaint: Albert Sanders is a 52 y.o. male with chronic lymphocytic leukemia who is seen for reassessment prior to upcoming surgery.  HPI: The patient was last seen in the medical oncology clinic on 09/29/2016.  At that time, he was seen for reassessment after being lost to follow-up.  Symptomatically, he had chronic fatigue and intermittent sweats.  He had a low testosterone level.  Exam revealed no adenopathy or hepatosplenomegaly.  CBC revealed a hematocrit of 43.4, hemoglobin 14.2, MCV 82, platelets 185,000, WBC 12,900 with an ANC of 6700.  ALC was 4800.  He was again lost to follow-up.  CBC on 11/07/2017 revealed a hematocrit of 45.6, hemoglobin 14.8, MCV 80, platelets 250,000, WBC 16,200 with an ANC of 7047.  ALC was 7970.  He is scheduled for left knee hardware removal on 01/08/2018.  Symptomatically, he feels "pretty good".  He notes that he twisted his left knee.  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   Plan is to "remove a screw".  He has had no issues with infections, bruising or bleeding.  He denies any B symptoms.  Weight has been stable at 270 -275 pounds for "a long time".  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  He states that he was seen by cardiology for surgical clearance.  They "did a test".   Past Medical History:  Diagnosis Date  . Clavicular fracture   . CLL (chronic lymphocytic leukemia) ([place])   . Complication of anesthesia    woke up during gallbladder surgery-pt states it takes alot of anesthesia to sedate him   . Erectile dysfunction   . GERD (gastroesophageal reflux disease)   . Morbid obesity ([place])   . Urinary calculus     Past Surgical History:  Procedure Laterality Date  . ANTERIOR CRUCIATE LIGAMENT REPAIR    . BICEPS TENDON REPAIR Right   . BONE MARROW BIOPSY  08-2015  . CHOLECYSTECTOMY    . CYSTOSCOPY W/ RETROGRADES Bilateral 09/16/2015   Procedure: CYSTOSCOPY WITH RETROGRADE PYELOGRAM;  Surgeon: Nickie Retort, MD;  Location: [facility] ORS;  Service: Urology;  Laterality: Bilateral;    Family History  Problem Relation Age of Onset  . Diabetes Paternal Uncle   . Cancer Paternal Grandmother        breast  . Diabetes Paternal Grandfather   . CAD Maternal Grandmother   . Heart disease Neg Hx   . Hypertension Neg Hx   . Stroke Neg Hx   . COPD Neg Hx     Social History:  reports that  has never smoked. His smokeless tobacco use includes snuff. He reports that he drinks alcohol. He reports that he does not use drugs.  He notes exposure to various toxins. He dips 1 can a day. He rarely drinks alcohol.  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  He works as a Development worker, community.  He lives in [place].  He is alone today.  Allergies:  Allergies  Allergen Reactions  . Contrast Media [Iodinated Diagnostic Agents] Anaphylaxis    Current Medications: Current Outpatient Medications  Medication Sig Dispense Refill  . acetaminophen (TYLENOL) 500 MG tablet Take 1,500 mg by mouth 3 (three) times daily as needed for moderate pain or headache.     . ibuprofen (ADVIL,MOTRIN) 200 MG tablet Take 800 mg by mouth 3 (three) times daily as needed for headache or moderate pain.    Marland Kitchen  omeprazole (PRILOSEC OTC) 20 MG tablet Take 1 tablet (20 mg total) by mouth every morning. (Patient taking differently: Take 40 mg by mouth every morning. ) 30 tablet 1  . oxymetazoline (AFRIN) 0.05 % nasal spray Place 1 spray into both nostrils daily as needed for congestion.    . sildenafil (REVATIO) 20 MG tablet Take 1-2 tablets (20-40 mg total) by mouth daily as needed. (Patient taking differently: Take 80 mg by mouth daily as needed (erectile dysfunction). ) 20 tablet 0  . traMADol (ULTRAM) 50 MG tablet Take 50 mg by mouth every 4 hours as needed for pain     No current facility-administered medications for this visit.     Review of Systems:  GENERAL:  Feels "pretty good".   "Dog tired in the evening after working".  No fevers.  No night sweats in "a long time".  Weight up and down, typically 270 - 275 pounds. PERFORMANCE STATUS (ECOG):  0 HEENT:  No runny nose, mouth sores or tenderness. Lungs: No shortness of breath or cough.  No hemoptysis. Cardiac:  No chest pain, palpitations, orthopnea, or PND. GI:  No nausea, vomiting, diarrhea, melena or hematochezia. GU:  Low testosterone.  No urgency, frequency, dysuria, or hematuria. Musculoskeletal:  Chronic back issues.  No joint pain.  No muscle tenderness. Extremities:  No pain or swelling.  Interval parrot bite and infection (see HPI). Skin:  No rashes or skin changes. Neuro:  No headache, numbness or weakness, balance or coordination issues. Endocrine:  No diabetes, thyroid issues, hot flashes or night sweats. Psych:  No mood changes, depression or anxiety. Pain:  No focal pain. Review of systems:  All other systems reviewed and found to be negative.  Physical Exam: Blood pressure 117/80, pulse 79, temperature (!) 97 F (36.1 C), temperature source Tympanic, resp. rate 20, weight 273 lb 8 oz (124.1 kg). GENERAL:  Well developed, well nourished, heavy set gentleman sitting comfortably in the exam room in no acute distress. MENTAL STATUS:  Alert and oriented to person, place and time. HEAD:  Short gray hair.  Normocephalic, atraumatic, face symmetric, no Cushingoid features. EYES:  Blue eyes.  Pupils equal round and reactive to light and accomodation.  No conjunctivitis or scleral icterus. ENT:  Oropharynx clear without lesion.  Tongue normal. Mucous membranes moist.  RESPIRATORY:  Clear to auscultation without rales, wheezes or rhonchi. CARDIOVASCULAR:  Regular rate and rhythm without murmur, rub or gallop. ABDOMEN:  Fully round.  Soft, non-tender, with active bowel sounds, and no appreciable hepatosplenomegaly.  No masses. SKIN:  No rashes, ulcers or lesions. EXTREMITIES: No edema, no skin discoloration or  tenderness.  No palpable cords. LYMPH NODES: No palpable cervical, supraclavicular, axillary or inguinal adenopathy  NEUROLOGICAL: Unremarkable. PSYCH:  Appropriate.    Appointment on 12/22/2017  Component Date Value Ref Range Status  . Uric Acid, Serum 12/22/2017 6.0  4.4 - 7.6 mg/dL Final   Performed at [facility], [address] [place], [place] [address]  . LDH 12/22/2017 115  98 - 192 U/L Final   Performed at [facility], [address] [place], [place] [address]  . Sodium 12/22/2017 134* 135 - 145 mmol/L Final  . Potassium 12/22/2017 4.2  3.5 - 5.1 mmol/L Final  . Chloride 12/22/2017 98* 101 - 111 mmol/L Final  . CO2 12/22/2017 29  22 - 32 mmol/L Final  . Glucose, Bld 12/22/2017 122* 65 - 99 mg/dL Final  . BUN 12/22/2017 15  6 - 20 mg/dL  Final  . Creatinine, Ser 12/22/2017 0.78  0.61 - 1.24 mg/dL Final  . Calcium 12/22/2017 9.3  8.9 - 10.3 mg/dL Final  . Total Protein 12/22/2017 7.5  6.5 - 8.1 g/dL Final  . Albumin 12/22/2017 4.2  3.5 - 5.0 g/dL Final  . AST 12/22/2017 31  15 - 41 U/L Final  . ALT 12/22/2017 44  17 - 63 U/L Final  . Alkaline Phosphatase 12/22/2017 89  38 - 126 U/L Final  . Total Bilirubin 12/22/2017 1.0  0.3 - 1.2 mg/dL Final  . GFR calc non Af Amer 12/22/2017 >60  >60 mL/min Final  . GFR calc Af Amer 12/22/2017 >60  >60 mL/min Final   Comment: (NOTE) The eGFR has been calculated using the CKD EPI equation. This calculation has not been validated in all clinical situations. eGFR's persistently <60 mL/min signify possible Chronic Kidney Disease.   Georgiann Hahn gap 12/22/2017 7  5 - 15 Final   Performed at [facility], [address] [place], [place] [address]  . WBC 12/22/2017 12.0* 3.8 - 10.6 K/uL Final  . RBC 12/22/2017 5.47  4.40 - 5.90 MIL/uL Final  . Hemoglobin 12/22/2017 14.2  13.0 - 18.0 g/dL Final  . HCT 12/22/2017 44.3  40.0 - 52.0 % Final  . MCV 12/22/2017 80.9  80.0 - 100.0 fL Final  . MCH 12/22/2017 26.0  26.0 - 34.0  pg Final  . MCHC 12/22/2017 32.1  32.0 - 36.0 g/dL Final  . RDW 12/22/2017 14.1  11.5 - 14.5 % Final  . Platelets 12/22/2017 201  150 - 440 K/uL Final  . Neutrophils Relative % 12/22/2017 53  % Final  . Neutro Abs 12/22/2017 6.3  1.4 - 6.5 K/uL Final  . Lymphocytes Relative 12/22/2017 38  % Final  . Lymphs Abs 12/22/2017 4.5* 1.0 - 3.6 K/uL Final  . Monocytes Relative 12/22/2017 6  % Final  . Monocytes Absolute 12/22/2017 0.7  0.2 - 1.0 K/uL Final  . Eosinophils Relative 12/22/2017 1  % Final  . Eosinophils Absolute 12/22/2017 0.1  0 - 0.7 K/uL Final  . Basophils Relative 12/22/2017 2  % Final  . Basophils Absolute 12/22/2017 0.3* 0 - 0.1 K/uL Final   Performed at [facility], [address] [place], [place] [address]  . IgG (Immunoglobin G), Serum 12/22/2017 1,175  700 - 1,600 mg/dL Final  . IgA 12/22/2017 298  90 - 386 mg/dL Final  . IgM (Immunoglobulin M), Srm 12/22/2017 101  20 - 172 mg/dL Final   Comment: (NOTE) Performed At: [facility] [place], [place] [id number] Rush Farmer MD [id number] Performed at [facility], [address], [place],  [address]     Assessment:  Albert Sanders is a 52 y.o. 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  He was noted to have abdominal adenopathy and mild splenomegaly on CT scan in 02/2015 following a fall.  He represented with abdominal pain, fever, and sweats.  Abdominal and pelvic CT scan without contrast on 07/25/2015 revealed abdominal adenopathy with the largest lymph node in the portacaval region measuring 1.9 cm (stable).  There was mild splenomegaly (15 cm).    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  As there were findings of a significant population of monoclonal B cells, but less than the threshold for CLL of 5000, this was felt to be possibly  indicative of an emerging B-cell chronic lymphocytic leukemia or small lymphocytic lymphoma.  Labs on 08/07/2015 revealed a hematocrit of 43, hemoglobin 13.9, platelets 200,000, white count 13,000 with an ANC of 500. Differential included 50% segs and 42% lymphs with a mild lymphocytosis (5500).  Comprehensive metabolic, LDH, uric acid, sedimentation rate, SPEP, and immunoglobulins were normal.  PET scan on 08/07/2015 revealed no hypermetabolic lymphadenopathy or metastatic disease. There was a mildly enlarged gastrohepatic ligament, portacaval, and right external iliac lymph nodes (1.3-1.9 cm).  He had a top normal spleen size.   Bone marrow on 09/04/2015 revealed <10% involvement with B-cell lymphoproliferative neoplasm with a B-cell CLL/SLL immunophenotype.  There was mild non-specific dyserythropoiesis and mild increase in reticulin.  Storage iron was present. Flow cytometry revealed CD5+ monotypic (clonal) B cell population (20% of sample) with B-cell CLL/SLL immunophenotype.  Cytogenetics were normal (46, XY).  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  He has had infections.  He had a severe flexor tenosynovitis of the right 4th finger in 08/2015 after being bitten by a parrot.  He had debridement, antibiotics, and a skin graft.  He had an infection overlying his right wrist recently (unroofed by patient).  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  Cystoscopy with retrograde pyelogram on 09/16/2015 revealed no abnormalities.  Testosterone was [phone]) on 08/28/2015.    Symptomatically, he denies any B symptoms.  He denies any infections, bruising or bleeding.  He is scheduled for left knee surgery.  Exam reveals no adenopathy or hepatosplenomegaly.  Hemoglobin and platelet count are normal.  WBC is mildly elevated (12,000) with an [place] is 6300.  ALC is 4500.  Plan: 1.  Labs today:  CBC with diff, CMP, LDH, uric acid, immunoglobulin levels. 2.  Discuss interim events.   Patient doing well.  No indications for treatment. 3.  RTC in 6 months for MD assessment and labs (CBC with diff, CMP, LDH, uric acid).   Lequita Asal, MD  12/22/2017, 4:35 PM

## 2017-12-23 LAB — IGG, IGA, IGM
IgA: 298 mg/dL (ref 90–386)
IgG (Immunoglobin G), Serum: 1175 mg/dL (ref 700–1600)
IgM (Immunoglobulin M), Srm: 101 mg/dL (ref 20–172)

## 2017-12-24 ENCOUNTER — Encounter: Payer: Self-pay | Admitting: Hematology and Oncology

## 2017-12-26 ENCOUNTER — Ambulatory Visit: Payer: Self-pay | Admitting: Orthopedic Surgery

## 2018-01-01 ENCOUNTER — Inpatient Hospital Stay
Admission: RE | Admit: 2018-01-01 | Discharge: 2018-01-01 | Disposition: A | Discharge status: Home / Self Care | Payer: Self-pay | Source: Ambulatory Visit

## 2018-01-01 NOTE — Pre-Procedure Instructions (Signed)
Lequita Asal, MD  Physician  Oncology      Progress Notes  Signed     Encounter Date:  12/22/2017                 Signed                     Expand widget buttonCollapse widget button     Hide copied text   Hover for detailscustomization button                                                             [facility] day:  12/22/17     Chief Complaint: Albert Sanders is a 52 y.o. male with chronic lymphocytic leukemia who is seen for reassessment prior to upcoming surgery.     HPI: The patient was last seen in the medical oncology clinic on 09/29/2016.  At that time, he was seen for reassessment after being lost to follow-up.  Symptomatically, he had chronic fatigue and intermittent sweats.  He had a low testosterone level.  Exam revealed no adenopathy or hepatosplenomegaly.  CBC revealed a hematocrit of 43.4, hemoglobin 14.2, MCV 82, platelets 185,000, WBC 12,900 with an ANC of 6700.  ALC was 4800.     He was again lost to follow-up.  CBC on 11/07/2017 revealed a hematocrit of 45.6, hemoglobin 14.8, MCV 80, platelets 250,000, WBC 16,200 with an ANC of 7047.  ALC was 7970.     He is scheduled for left knee hardware removal on 01/08/2018.     Symptomatically, he feels "pretty good".  He notes that he twisted his left knee.  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   Plan is to "remove a screw".  He has had no issues with infections, bruising or bleeding.  He denies any B symptoms.  Weight has been stable at 270 -275 pounds for "a long time".  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     He states that he was seen by cardiology for surgical clearance.  They "did a test".             Past Medical History:    Diagnosis   Date    .   Clavicular fracture        .   CLL (chronic lymphocytic  leukemia) ([place])        .   Complication of anesthesia            woke up during gallbladder surgery-pt states it takes alot of anesthesia to sedate him     .   Erectile dysfunction        .   GERD (gastroesophageal reflux disease)        .   Morbid obesity ([place])        .   Urinary calculus                    Past Surgical History:    Procedure   Laterality   Date    .   ANTERIOR CRUCIATE LIGAMENT REPAIR            .   BICEPS TENDON REPAIR  Right        .   BONE MARROW BIOPSY       08-2015    .   CHOLECYSTECTOMY            .   CYSTOSCOPY W/ RETROGRADES   Bilateral   09/16/2015        Procedure: CYSTOSCOPY WITH RETROGRADE PYELOGRAM;  Surgeon: Nickie Retort, MD;  Location: [facility] ORS;  Service: Urology;  Laterality: Bilateral;                Family History    Problem   Relation   Age of Onset    .   Diabetes   Paternal Uncle        .   Cancer   Paternal Grandmother                breast    .   Diabetes   Paternal Grandfather        .   CAD   Maternal Grandmother        .   Heart disease   Neg Hx        .   Hypertension   Neg Hx        .   Stroke   Neg Hx        .   COPD   Neg Hx              Social History:  reports that  has never smoked. His smokeless tobacco use includes snuff. He reports that he drinks alcohol. He reports that he does not use drugs.  He notes exposure to various toxins. He dips 1 can a day. He rarely drinks alcohol.  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  He works as a Development worker, community.  He lives in [place].  He is alone today.     Allergies:        Allergies    Allergen   Reactions    .   Contrast Media [Iodinated Diagnostic Agents]   Anaphylaxis          Current Medications:         Current Outpatient Medications    Medication   Sig   Dispense   Refill     .   acetaminophen (TYLENOL) 500 MG tablet   Take 1,500 mg by mouth 3 (three) times daily as needed for moderate pain or headache.             .   ibuprofen (ADVIL,MOTRIN) 200 MG tablet   Take 800 mg by mouth 3 (three) times daily as needed for headache or moderate pain.            Marland Kitchen   omeprazole (PRILOSEC OTC) 20 MG tablet   Take 1 tablet (20 mg total) by mouth every morning. (Patient taking differently: Take 40 mg by mouth every morning. )   30 tablet   1    .   oxymetazoline (AFRIN) 0.05 % nasal spray   Place 1 spray into both nostrils daily as needed for congestion.            .   sildenafil (REVATIO) 20 MG tablet   Take 1-2 tablets (20-40 mg total) by mouth daily as needed. (Patient taking differently: Take 80 mg by mouth daily as needed (erectile dysfunction). )   20 tablet   0    .   traMADol (ULTRAM) 50 MG tablet   Take 50  mg by mouth every 4 hours as needed for pain                No current facility-administered medications for this visit.           Review of Systems:   GENERAL:  Feels "pretty good".  "Dog tired in the evening after working".  No fevers.  No night sweats in "a long time".  Weight up and down, typically 270 - 275 pounds.  PERFORMANCE STATUS (ECOG):  0  HEENT:  No runny nose, mouth sores or tenderness.  Lungs: No shortness of breath or cough.  No hemoptysis.  Cardiac:  No chest pain, palpitations, orthopnea, or PND.  GI:  No nausea, vomiting, diarrhea, melena or hematochezia.  GU:  Low testosterone.  No urgency, frequency, dysuria, or hematuria.  Musculoskeletal:  Chronic back issues.  No joint pain.  No muscle tenderness.  Extremities:  No pain or swelling.  Interval parrot bite and infection (see HPI).  Skin:  No rashes or skin changes.  Neuro:  No headache, numbness or weakness, balance or coordination issues.  Endocrine:  No diabetes, thyroid issues, hot flashes or night  sweats.  Psych:  No mood changes, depression or anxiety.  Pain:  No focal pain.  Review of systems:  All other systems reviewed and found to be negative.     Physical Exam:  Blood pressure 117/80, pulse 79, temperature (!) 97 F (36.1 C), temperature source Tympanic, resp. rate 20, weight 273 lb 8 oz (124.1 kg).  GENERAL:  Well developed, well nourished, heavy set gentleman sitting comfortably in the exam room in no acute distress.  MENTAL STATUS:  Alert and oriented to person, place and time.  HEAD:  Short gray hair.  Normocephalic, atraumatic, face symmetric, no Cushingoid features.  EYES:  Blue eyes.  Pupils equal round and reactive to light and accomodation.  No conjunctivitis or scleral icterus.  ENT:  Oropharynx clear without lesion.  Tongue normal. Mucous membranes moist.   RESPIRATORY:  Clear to auscultation without rales, wheezes or rhonchi.  CARDIOVASCULAR:  Regular rate and rhythm without murmur, rub or gallop.  ABDOMEN:  Fully round.  Soft, non-tender, with active bowel sounds, and no appreciable hepatosplenomegaly.  No masses.  SKIN:  No rashes, ulcers or lesions.  EXTREMITIES: No edema, no skin discoloration or tenderness.  No palpable cords.  LYMPH NODES: No palpable cervical, supraclavicular, axillary or inguinal adenopathy   NEUROLOGICAL: Unremarkable.  PSYCH:  Appropriate.                 Appointment on 12/22/2017    Component   Date   Value   Ref Range   Status    .   Uric Acid, Serum   12/22/2017   6.0    4.4 - 7.6 mg/dL   Final        Performed at [facility], [address] [place], [place] [address]    .   LDH   12/22/2017   115    98 - 192 U/L   Final        Performed at [facility], [address] [place], [place] [address]    .   Sodium   12/22/2017   134*   135 - 145 mmol/L   Final    .   Potassium   12/22/2017   4.2    3.5 - 5.1 mmol/L   Final     .   Chloride  12/22/2017   98*   101 - 111 mmol/L   Final    .   CO2   12/22/2017   29    22 - 32 mmol/L   Final    .   Glucose, Bld   12/22/2017   122*   65 - 99 mg/dL   Final    .   BUN   12/22/2017   15    6 - 20 mg/dL   Final    .   Creatinine, Ser   12/22/2017   0.78    0.61 - 1.24 mg/dL   Final    .   Calcium   12/22/2017   9.3    8.9 - 10.3 mg/dL   Final    .   Total Protein   12/22/2017   7.5    6.5 - 8.1 g/dL   Final    .   Albumin   12/22/2017   4.2    3.5 - 5.0 g/dL   Final    .   AST   12/22/2017   31    15 - 41 U/L   Final    .   ALT   12/22/2017   44    17 - 63 U/L   Final    .   Alkaline Phosphatase   12/22/2017   89    38 - 126 U/L   Final    .   Total Bilirubin   12/22/2017   1.0    0.3 - 1.2 mg/dL   Final    .   GFR calc non Af Amer   12/22/2017   >60    >60 mL/min   Final    .   GFR calc Af Amer   12/22/2017   >60    >60 mL/min   Final        Comment: (NOTE)  The eGFR has been calculated using the CKD EPI equation.  This calculation has not been validated in all clinical situations.  eGFR's persistently <60 mL/min signify possible Chronic Kidney  Disease.       Georgiann Hahn gap   12/22/2017   7    5 - 15   Final        Performed at [facility], [address] [place], [place] [address]    .   WBC   12/22/2017   12.0*   3.8 - 10.6 K/uL   Final    .   RBC   12/22/2017   5.47    4.40 - 5.90 MIL/uL   Final    .   Hemoglobin   12/22/2017   14.2    13.0 - 18.0 g/dL   Final    .   HCT   12/22/2017   44.3    40.0 - 52.0 %   Final    .   MCV   12/22/2017   80.9    80.0 - 100.0 fL   Final    .   MCH   12/22/2017   26.0    26.0 - 34.0 pg   Final    .   MCHC   12/22/2017   32.1    32.0 - 36.0 g/dL    Final    .   RDW   12/22/2017   14.1    11.5 - 14.5 %   Final    .   Platelets   12/22/2017  201    150 - 440 K/uL   Final    .   Neutrophils Relative %   12/22/2017   53    %   Final    .   Neutro Abs   12/22/2017   6.3    1.4 - 6.5 K/uL   Final    .   Lymphocytes Relative   12/22/2017   38    %   Final    .   Lymphs Abs   12/22/2017   4.5*   1.0 - 3.6 K/uL   Final    .   Monocytes Relative   12/22/2017   6    %   Final    .   Monocytes Absolute   12/22/2017   0.7    0.2 - 1.0 K/uL   Final    .   Eosinophils Relative   12/22/2017   1    %   Final    .   Eosinophils Absolute   12/22/2017   0.1    0 - 0.7 K/uL   Final    .   Basophils Relative   12/22/2017   2    %   Final    .   Basophils Absolute   12/22/2017   0.3*   0 - 0.1 K/uL   Final        Performed at [facility], [address] [place], [place] [address]    .   IgG (Immunoglobin G), Serum   12/22/2017   1,175    700 - 1,600 mg/dL   Final    .   IgA   12/22/2017   298    90 - 386 mg/dL   Final    .   IgM (Immunoglobulin M), Srm   12/22/2017   101    20 - 172 mg/dL   Final        Comment: (NOTE)  Performed At: [facility]  [place], [place] [id number]  Rush Farmer MD RR:[id number]  Performed at [facility], [address] [place], [place]  [address]             Assessment:  KAHLEN MORAIS is a 52 y.o. 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  He was noted to have abdominal adenopathy and mild splenomegaly on CT scan in 02/2015 following a fall.  He represented with abdominal pain, fever, and sweats.  Abdominal and pelvic CT scan without contrast on 07/25/2015 revealed abdominal adenopathy with the largest lymph node in the portacaval region measuring 1.9 cm (stable).  There was  mild splenomegaly (15 cm).       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     Labs on 08/07/2015 revealed a hematocrit of 43, hemoglobin 13.9, platelets 200,000, white count 13,000 with an ANC of 500. Differential included 50% segs and 42% lymphs with a mild lymphocytosis (5500).  Comprehensive metabolic, LDH, uric acid, sedimentation rate, SPEP, and immunoglobulins were normal.     PET scan on 08/07/2015 revealed no hypermetabolic lymphadenopathy or metastatic disease. There was a mildly enlarged gastrohepatic  ligament, portacaval, and right external iliac lymph nodes (1.3-1.9 cm).  He had a top normal spleen size.      Bone marrow on 09/04/2015 revealed <10% involvement with B-cell lymphoproliferative neoplasm with a B-cell CLL/SLL immunophenotype.  There was mild non-specific dyserythropoiesis and mild increase in reticulin.  Storage iron was present. Flow cytometry revealed CD5+ monotypic (clonal) B cell population (20% of sample) with B-cell CLL/SLL immunophenotype.  Cytogenetics were normal (46, XY).  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     He has had infections.  He had a severe flexor tenosynovitis of the right 4th finger in 08/2015 after being bitten by a parrot.  He had debridement, antibiotics, and a skin graft.  He had an infection overlying his right wrist recently (unroofed by patient).     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  Cystoscopy with retrograde pyelogram on 09/16/2015 revealed no abnormalities.  Testosterone was 113 [phone]) on 08/28/2015.       Symptomatically, he denies any B  symptoms.  He denies any infections, bruising or bleeding.  He is scheduled for left knee surgery.  Exam reveals no adenopathy or hepatosplenomegaly.  Hemoglobin and platelet count are normal.  WBC is mildly elevated (12,000) with an [place] is 6300.  ALC is 4500.     Plan:  1.  Labs today:  CBC with diff, CMP, LDH, uric acid, immunoglobulin levels.  2.  Discuss interim events.  Patient doing well.  No indications for treatment.  3.  RTC in 6 months for MD assessment and labs (CBC with diff, CMP, LDH, uric acid).        Lequita Asal, MD   12/22/2017, 4:35 PM           Electronically signed by Lequita Asal, MD at 12/24/2017  3:51 PM               Office Visit on 12/22/2017                  Detailed Report

## 2018-01-02 ENCOUNTER — Telehealth: Payer: Self-pay | Admitting: Cardiovascular Disease

## 2018-01-02 ENCOUNTER — Encounter
Admission: RE | Admit: 2018-01-02 | Discharge: 2018-01-02 | Disposition: A | Discharge status: Home / Self Care | Payer: Self-pay | Source: Ambulatory Visit | Attending: Orthopedic Surgery | Admitting: Orthopedic Surgery

## 2018-01-02 ENCOUNTER — Other Ambulatory Visit: Payer: Self-pay

## 2018-01-02 HISTORY — DX: Unspecified osteoarthritis, unspecified site: M19.90

## 2018-01-02 HISTORY — DX: Personal history of urinary calculi: Z87.442

## 2018-01-02 NOTE — Patient Instructions (Signed)
Your procedure is scheduled on: 01-08-18  Report to Same Day Surgery 2nd floor medical mall [facility] Entrance-take elevator on left to 2nd floor.  Check in with surgery information desk.) To find out your arrival time please call [phone] between 1PM - 3PM on 01-05-18  Remember: Instructions that are not followed completely may result in serious medical risk, up to and including death, or upon the discretion of your surgeon and anesthesiologist your surgery may need to be rescheduled.    _x___ 1. Do not eat food after midnight the night before your procedure. NO GUM OR CANDY AFTER MIDNIGHT.  You may drink clear liquids up to 2 hours before you are scheduled to arrive at the hospital for your procedure.  Do not drink clear liquids within 2 hours of your scheduled arrival to the hospital.  Clear liquids include  --Water or Apple juice without pulp  --Clear carbohydrate beverage such as ClearFast or Gatorade  --Black Coffee or Clear Tea (No milk, no creamers, do not add anything to  the coffee or Tea      __x__ 2. No Alcohol for 24 hours before or after surgery.   __x__3. No Smoking for 24 prior to surgery-DO NOT DIP 6 HOURS PRIOR TO SURGERY   ____  4. Bring all medications with you on the day of surgery if instructed.    __x__ 5. Notify your doctor if there is any change in your medical condition     (cold, fever, infections).     Do not wear jewelry, make-up, hairpins, clips or nail polish.  Do not wear lotions, powders, or perfumes. You may wear deodorant.  Do not shave 48 hours prior to surgery. Men may shave face and neck.  Do not bring valuables to the hospital.    [facility] is not responsible for any belongings or valuables.               Contacts, dentures or bridgework may not be worn into surgery.  Leave your suitcase in the car. After surgery it may be brought to your room.  For patients admitted to the hospital, discharge time is determined by your treatment  team.   Patients discharged the day of surgery will not be allowed to drive home.  You will need someone to drive you home and stay with you the night of your procedure.    Please read over the following fact sheets that you were given:   [facility] Preparing for Surgery and or MRSA Information   _x___ TAKE THE FOLLOWING MEDICATION THE MORNING OF SURGERY WITH A SMALL SIP OF WATER. These include:  1. PRILOSEC  2. TAKE AN EXTRA PRILOSEC ON Sunday NIGHT BEFORE BED  3.  4.  5.  6.  ____Fleets enema or Magnesium Citrate as directed.   ____ Use CHG Soap or sage wipes as directed on instruction sheet   ____ Use inhalers on the day of surgery and bring to hospital day of surgery  ____ Stop Metformin and Janumet 2 days prior to surgery.    ____ Take 1/2 of usual insulin dose the night before surgery and none on the morning surgery.   ____ Follow recommendations from Cardiologist, Pulmonologist or PCP regarding stopping Aspirin, Coumadin, Plavix ,Eliquis, Effient, or Pradaxa, and Pletal.  X____Stop Anti-inflammatories such as Advil, Aleve, IBUPROFEN, Motrin, Naproxen, Naprosyn, Goodies powders or aspirin products NOW- OK to take Tylenol OR TRAMADOL IF NEEDED   ____ Stop supplements until after surgery.  ____ Bring C-Pap to the hospital.

## 2018-01-02 NOTE — Pre-Procedure Instructions (Signed)
Albert Mu, PA-C  Physician Assistant Certified  Cardiology  Progress Notes  Signed  Encounter Date:  11/24/2017          Signed      Expand All Collapse All       []Hide copied text  []Hover for details      Cardiology Office Note Date:  11/24/2017  Patient ID:  Albert Sanders Jul 01, 1966, MRN [id number] PCP:  Arnetha Courser, MD    Cardiologist:  Formerly, Dr. Yvone Neu, MD    Chief Complaint: Preoperative cardiac evaluation   History of Present Illness: Albert Sanders is a 52 y.o. male with history of CLL, prior remote cocaine abuse, morbid obesity, low back pain, nephrolithiasis, snoring, ED, and GERD who presents for pre-operative cardiac evaluation.   He was previously seen by Dr. Yvone Neu on 11/25/2016 for evaluation of chest pain, SOB, and fatigue for ~ 6-12 months at that time. He had previously undergone CTA abdomen in 04/2016 for LUQ pain that showed splenomegaly with mildly enlarged lymph nodes consistent with CLL. Exam also showed mild cardiomegaly with a dilated left coronary sinus. Follow up chest MRA in 04/2016 at [facility] for evaluation of his chest/LUQ pain and splenomegaly was negative for PE and noted mild splenomegaly and borderline enlarged right axillary lymph node. At his visit in 10/2016, his chest pain and SOB were associated with exertion, described as non-radiating and discomfort-like, lasting minutes at a time and resolved with rest. Stress test and echocardiogram were recommended and ordered, but were not completed by the patient. He reports "I just felt like I didn't need those tests." He was also advised to undergo a sleep study which has not been completed.   He is scheduled for left knee hardware removal and PCP has requested cardiac clearance.   He comes in today doing well, though has noted a worsening of his reflux with increased belching over the past month. He does report an improvement in his chest pain but continues to note SOB and is  fatigued during the day. Works as a Development worker, community. Occasionally notes working reflux and belching when exerting himself. Currently, symptom free. Able to achieve > 4 METs.         Past Medical History:  Diagnosis Date  . Clavicular fracture   . CLL (chronic lymphocytic leukemia) ([place])   . Complication of anesthesia    woke up during gallbladder surgery-pt states it takes alot of anesthesia to sedate him   . Erectile dysfunction   . GERD (gastroesophageal reflux disease)   . Morbid obesity ([place])   . Urinary calculus          Past Surgical History:  Procedure Laterality Date  . ANTERIOR CRUCIATE LIGAMENT REPAIR    . BICEPS TENDON REPAIR Right   . BONE MARROW BIOPSY  08-2015  . CHOLECYSTECTOMY    . CYSTOSCOPY W/ RETROGRADES Bilateral 09/16/2015   Procedure: CYSTOSCOPY WITH RETROGRADE PYELOGRAM;  Surgeon: Nickie Retort, MD;  Location: [facility] ORS;  Service: Urology;  Laterality: Bilateral;    ActiveMedications      Current Meds  Medication Sig  . acetaminophen (TYLENOL) 500 MG tablet Take 500 mg by mouth.  Marland Kitchen omeprazole (PRILOSEC OTC) 20 MG tablet Take 1 tablet (20 mg total) by mouth every morning.  . sildenafil (REVATIO) 20 MG tablet Take 1-2 tablets (20-40 mg total) by mouth daily as needed.  . traMADol (ULTRAM) 50 MG tablet tramadol 50 mg tablet  Take 1 tablet every 4 hours  by oral route.      Allergies:   Contrast media [iodinated diagnostic agents]   Social History:  The patient  reports that  has never smoked. His smokeless tobacco use includes snuff. He reports that he drinks alcohol. He reports that he does not use drugs.   Family History:  The patient's family history includes Cancer in his paternal grandmother; Diabetes in his paternal grandfather and paternal uncle.  ROS:   Review of Systems  Constitutional: Positive for malaise/fatigue. Negative for chills, diaphoresis, fever and weight loss.  HENT: Negative for congestion.   Eyes:  Negative for discharge and redness.  Respiratory: Positive for shortness of breath. Negative for cough, hemoptysis, sputum production and wheezing.   Cardiovascular: Negative for chest pain, palpitations, orthopnea, claudication, leg swelling and PND.  Gastrointestinal: Positive for heartburn. Negative for abdominal pain, blood in stool, melena, nausea and vomiting.  Genitourinary: Negative for hematuria.  Musculoskeletal: Positive for joint pain. Negative for falls and myalgias.  Skin: Negative for rash.  Neurological: Negative for dizziness, tingling, tremors, sensory change, speech change, focal weakness, loss of consciousness and weakness.  Endo/Heme/Allergies: Does not bruise/bleed easily.  Psychiatric/Behavioral: Positive for substance abuse. The patient is not nervous/anxious.   All other systems reviewed and are negative.    PHYSICAL EXAM:  VS:  BP 120/66 (BP Location: Left Arm, Patient Position: Sitting, Cuff Size: Large)   Pulse 84   Ht 6' 1" (1.854 m)   Wt 285 lb (129.3 kg)   BMI 37.60 kg/m  BMI: Body mass index is 37.6 kg/m.  Physical Exam  Constitutional: He is oriented to person, place, and time. He appears well-developed and well-nourished.  HENT:  Head: Normocephalic and atraumatic.  Eyes: Right eye exhibits no discharge. Left eye exhibits no discharge.  Neck: Normal range of motion. No JVD present.  Cardiovascular: Normal rate, regular rhythm, S1 normal, S2 normal and normal heart sounds. Exam reveals no distant heart sounds, no friction rub, no midsystolic click and no opening snap.  No murmur heard. Pulses:      Posterior tibial pulses are 2+ on the right side, and 2+ on the left side.  Pulmonary/Chest: Effort normal and breath sounds normal. No respiratory distress. He has no decreased breath sounds. He has no wheezes. He has no rales. He exhibits no tenderness.  Abdominal: Soft. He exhibits no distension. There is no tenderness.  Obese  Musculoskeletal: He  exhibits no edema.  Neurological: He is alert and oriented to person, place, and time.  Skin: Skin is warm and dry. No cyanosis. Nails show no clubbing.  Psychiatric: He has a normal mood and affect. His speech is normal and behavior is normal. Judgment and thought content normal.     EKG:  Was ordered and interpreted by me today. Shows NSR, 84 bpm, rare PVC, no acute st/t changes  Recent Labs: 11/07/2017: ALT 30; BUN 20; Creat 0.86; Hemoglobin 14.8; Platelets 250; Potassium 4.3; Sodium 139  No results found for requested labs within last 8760 hours.   Estimated Creatinine Clearance: 144.9 mL/min (by C-G formula based on SCr of 0.86 mg/dL).      Wt Readings from Last 3 Encounters:  11/24/17 285 lb (129.3 kg)  11/07/17 290 lb 12.8 oz (131.9 kg)  11/25/16 292 lb (132.5 kg)     Other studies reviewed: Additional studies/records reviewed today include: summarized above  ASSESSMENT AND PLAN:  1. Chest pain with moderate risk of cardiac etiology: -Currently chest pain free -Schedule treadmill Myoview to  evaluate for high risk ischemia -Discussed with patient I cannot accurately assess him for non-cardiac surgery without a stress test given his prior episodes of chest pain/SOB with recommendation of stress testing and current reflux-like symptoms that may in fact be his anginal equivalent  -Recommend echo pending stress test results -Risk factors include prior cocaine abuse, morbid obesity, and male -Of note, patient decided to take a phone call near the end of our visit today  2. Preoperative cardiac evaluation: -Schedule stress test as above (to be performed on 11/27/17) -Further assessment of his cardiac risk pending at this time -He has only been assessed from a cardiac perspective PREOPERATIVE CARDIAC RISK ASSESSMENT:   Revised Cardiac Risk Index:            High Risk Surgery (defined as Intraperitoneal, intrathoracic or suprainguinal vascular): low; (left knee  hardware removal)            Active CAD: yes; (reflux is a possible anginal equivalent)            CHF: no            Cerebrovascular Disease: no             Diabetes: no; On Insulin: no            CKD (Cr >~ 2): no   Total: 1 Estimated Risk of Adverse Outcome: low risk for low risk surgery. Estimated Risk of MI, PE, VF/VT (Cardiac Arrest), Complete Heart Block: 0.9 %   ACC/AHA Guidelines for "Clearance":            Step 1 - Need for Emergency Surgery: no             If Yes - go straight to OR with perioperative surveillance            Step 2 - Active Cardiac Conditions (Unstable Angina, Decompensated HF, Significant  Arrhytmias - Complete HB, Mobitz II, Symptomatic VT or SVT, Severe Aortic Stenosis - mean gradient > 40 mmHg, Valve area < 1.0 cm2): reflux being possible anginal equivalent              If Yes - Evaluate & Treat per ACC/AHA Guidelines            Step 3 -  Low Risk Surgery: yes            If Yes --> proceed to OR            If No --> Step 4            Step 4 - Functional Capacity >= 4 METS without symptoms: yes            If Yes --> proceed to OR            If No --> Step 5            Step 5 --  Clinical Risk Factors (CRF)  - Zero --> proceed to OR  3. CLL: -Recent CBC noted -Agree with recommendation that he schedule an appointment with Hem/Onc for medical surgical clearance  4. Morbid obesity/snoring: -Agree with rescheduling of sleep study  5. Hyperglycemia:  -Followed by PCP  Disposition: F/u with Dr. Saunders Revel in 3 months  Current medicines are reviewed at length with the patient today.  The patient did not have any concerns regarding medicines.  Melvern Banker PA-C 11/24/2017 2:29 PM     [address] [place],  [place] [address] [phone]          Electronically signed by Albert Mu, PA-C at 11/24/2017 3:41 PM     Office Visit on 11/24/2017        Detailed Report

## 2018-01-02 NOTE — Telephone Encounter (Signed)
   [facility] Pre-operative Risk Assessment    Request for surgical clearance:  1. What type of surgery is being performed? Surgical hardware removal left tibia  2. When is this surgery scheduled? Not listed  3. What type of clearance is required (medical clearance vs. Pharmacy clearance to hold med vs. Both)? Not listed  4. Are there any medications that need to be held prior to surgery and how long? Not listed  5. Practice name and name of physician performing surgery? Dr. Kurtis Bushman [facility]  6. What is your office phone and fax number? [phone], [phone]  7. Anesthesia type (None, local, MAC, general) ? Local/intern block   Albert Sanders 01/02/2018, 4:27 PM  _________________________________________________________________   (provider comments below)

## 2018-01-03 NOTE — Telephone Encounter (Signed)
Please  Call [facility] to discuss clearance. She states the proper form is not being faxed back for clearance

## 2018-01-03 NOTE — Telephone Encounter (Signed)
Clearance routed to number listed. 

## 2018-01-03 NOTE — Telephone Encounter (Signed)
This was faxed to the surgeon's office on 11/30/2017 at 11:36 AM.

## 2018-01-03 NOTE — Telephone Encounter (Signed)
Spoke with Sherri at [employer] and she states that they need [place] to resend information. Faxed last office visit and stress test results to number provided and spoke with Sherri as well. She was appreciative for [place] resending this information with no further questions at this time.

## 2018-01-07 MED ORDER — CEFAZOLIN SODIUM-DEXTROSE 2-4 GM/100ML-% IV SOLN
2.0000 g | INTRAVENOUS | Status: AC
  Administered 2018-01-08: 2 g via INTRAVENOUS

## 2018-01-08 ENCOUNTER — Encounter
Admission: RE | Disposition: A | Discharge status: Home / Self Care | Payer: Self-pay | Source: Ambulatory Visit | Attending: Orthopedic Surgery

## 2018-01-08 ENCOUNTER — Ambulatory Visit: Payer: Worker's Compensation | Admitting: Anesthesiology

## 2018-01-08 ENCOUNTER — Ambulatory Visit
Admission: RE | Admit: 2018-01-08 | Discharge: 2018-01-08 | Disposition: A | Discharge status: Home / Self Care | Payer: Worker's Compensation | Source: Ambulatory Visit | Attending: Orthopedic Surgery | Admitting: Orthopedic Surgery

## 2018-01-08 ENCOUNTER — Encounter: Payer: Self-pay | Admitting: Anesthesiology

## 2018-01-08 DIAGNOSIS — F419 Anxiety disorder, unspecified: Secondary | ICD-10-CM | POA: Diagnosis not present

## 2018-01-08 DIAGNOSIS — M1712 Unilateral primary osteoarthritis, left knee: Secondary | ICD-10-CM | POA: Insufficient documentation

## 2018-01-08 DIAGNOSIS — M25562 Pain in left knee: Secondary | ICD-10-CM | POA: Diagnosis not present

## 2018-01-08 DIAGNOSIS — K219 Gastro-esophageal reflux disease without esophagitis: Secondary | ICD-10-CM | POA: Diagnosis not present

## 2018-01-08 DIAGNOSIS — Y831 Surgical operation with implant of artificial internal device as the cause of abnormal reaction of the patient, or of later complication, without mention of misadventure at the time of the procedure: Secondary | ICD-10-CM | POA: Diagnosis not present

## 2018-01-08 DIAGNOSIS — N289 Disorder of kidney and ureter, unspecified: Secondary | ICD-10-CM | POA: Diagnosis not present

## 2018-01-08 DIAGNOSIS — Z859 Personal history of malignant neoplasm, unspecified: Secondary | ICD-10-CM | POA: Diagnosis not present

## 2018-01-08 DIAGNOSIS — Z9049 Acquired absence of other specified parts of digestive tract: Secondary | ICD-10-CM | POA: Insufficient documentation

## 2018-01-08 DIAGNOSIS — T8482XA Fibrosis due to internal orthopedic prosthetic devices, implants and grafts, initial encounter: Secondary | ICD-10-CM | POA: Diagnosis present

## 2018-01-08 DIAGNOSIS — Z79899 Other long term (current) drug therapy: Secondary | ICD-10-CM | POA: Diagnosis not present

## 2018-01-08 HISTORY — PX: HARDWARE REMOVAL: SHX979

## 2018-01-08 SURGERY — REMOVAL, HARDWARE
Anesthesia: General | Site: Knee | Laterality: Left | Wound class: Clean

## 2018-01-08 MED ORDER — OXYCODONE HCL 5 MG/5ML PO SOLN: 5.0000 mg | Freq: Once | ORAL | Status: DC | PRN

## 2018-01-08 MED ORDER — HYDROCODONE-ACETAMINOPHEN 5-325 MG PO TABS: 1.0000 | ORAL_TABLET | ORAL | Status: DC | PRN

## 2018-01-08 MED ORDER — LACTATED RINGERS IV SOLN: INTRAVENOUS | Status: DC

## 2018-01-08 MED ORDER — ONDANSETRON HCL 4 MG PO TABS: 4.0000 mg | ORAL_TABLET | Freq: Four times a day (QID) | ORAL | Status: DC | PRN

## 2018-01-08 MED ORDER — MIDAZOLAM HCL 2 MG/2ML IJ SOLN
INTRAMUSCULAR | Status: DC | PRN
  Administered 2018-01-08: 2 mg via INTRAVENOUS

## 2018-01-08 MED ORDER — FENTANYL CITRATE (PF) 100 MCG/2ML IJ SOLN: 25.0000 ug | INTRAMUSCULAR | Status: DC | PRN

## 2018-01-08 MED ORDER — ONDANSETRON HCL 4 MG/2ML IJ SOLN
INTRAMUSCULAR | Status: DC | PRN
  Administered 2018-01-08: 4 mg via INTRAVENOUS

## 2018-01-08 MED ORDER — BUPIVACAINE-EPINEPHRINE (PF) 0.5% -1:200000 IJ SOLN
INTRAMUSCULAR | Status: AC
  Filled 2018-01-08: qty 30

## 2018-01-08 MED ORDER — PROPOFOL 10 MG/ML IV BOLUS
INTRAVENOUS | Status: AC
  Filled 2018-01-08: qty 20

## 2018-01-08 MED ORDER — SODIUM CHLORIDE 0.9 % IR SOLN
Status: DC | PRN
  Administered 2018-01-08: 1000 mL

## 2018-01-08 MED ORDER — LIDOCAINE HCL (CARDIAC) 20 MG/ML IV SOLN
INTRAVENOUS | Status: DC | PRN
  Administered 2018-01-08: 100 mg via INTRAVENOUS

## 2018-01-08 MED ORDER — SODIUM CHLORIDE FLUSH 0.9 % IV SOLN
INTRAVENOUS | Status: AC
  Filled 2018-01-08: qty 10

## 2018-01-08 MED ORDER — HYDROCODONE-ACETAMINOPHEN 5-325 MG PO TABS: 1.0000 | ORAL_TABLET | ORAL | 0 refills | Status: DC | PRN

## 2018-01-08 MED ORDER — LACTATED RINGERS IV SOLN
INTRAVENOUS | Status: DC
  Administered 2018-01-08: 13:00:00 via INTRAVENOUS

## 2018-01-08 MED ORDER — DOCUSATE SODIUM 100 MG PO CAPS: 100.0000 mg | ORAL_CAPSULE | Freq: Every day | ORAL | 2 refills | Status: DC | PRN

## 2018-01-08 MED ORDER — BACITRACIN 50000 UNITS IM SOLR
INTRAMUSCULAR | Status: AC
  Filled 2018-01-08: qty 1

## 2018-01-08 MED ORDER — FENTANYL CITRATE (PF) 100 MCG/2ML IJ SOLN
INTRAMUSCULAR | Status: DC | PRN
  Administered 2018-01-08: 100 ug via INTRAVENOUS

## 2018-01-08 MED ORDER — DEXAMETHASONE SODIUM PHOSPHATE 10 MG/ML IJ SOLN
INTRAMUSCULAR | Status: DC | PRN
  Administered 2018-01-08: 10 mg via INTRAVENOUS

## 2018-01-08 MED ORDER — OXYCODONE-ACETAMINOPHEN 5-325 MG PO TABS
ORAL_TABLET | ORAL | Status: AC
  Filled 2018-01-08: qty 1

## 2018-01-08 MED ORDER — MIDAZOLAM HCL 2 MG/2ML IJ SOLN
INTRAMUSCULAR | Status: AC
  Filled 2018-01-08: qty 2

## 2018-01-08 MED ORDER — MORPHINE SULFATE (PF) 4 MG/ML IV SOLN: 1.0000 mg | INTRAVENOUS | Status: DC | PRN

## 2018-01-08 MED ORDER — BUPIVACAINE-EPINEPHRINE 0.5% -1:200000 IJ SOLN
INTRAMUSCULAR | Status: DC | PRN
  Administered 2018-01-08: 8 mL

## 2018-01-08 MED ORDER — PROPOFOL 10 MG/ML IV BOLUS
INTRAVENOUS | Status: DC | PRN
  Administered 2018-01-08: 250 mg via INTRAVENOUS

## 2018-01-08 MED ORDER — OXYCODONE-ACETAMINOPHEN 5-325 MG PO TABS
1.0000 | ORAL_TABLET | ORAL | Status: DC | PRN
  Administered 2018-01-08: 2 via ORAL

## 2018-01-08 MED ORDER — FENTANYL CITRATE (PF) 100 MCG/2ML IJ SOLN
INTRAMUSCULAR | Status: AC
  Filled 2018-01-08: qty 2

## 2018-01-08 MED ORDER — ONDANSETRON HCL 4 MG/2ML IJ SOLN: 4.0000 mg | Freq: Four times a day (QID) | INTRAMUSCULAR | Status: DC | PRN

## 2018-01-08 MED ORDER — OXYCODONE HCL 5 MG PO TABS: 5.0000 mg | ORAL_TABLET | Freq: Once | ORAL | Status: DC | PRN

## 2018-01-08 MED ORDER — METOCLOPRAMIDE HCL 5 MG/ML IJ SOLN: 5.0000 mg | Freq: Three times a day (TID) | INTRAMUSCULAR | Status: DC | PRN

## 2018-01-08 MED ORDER — CHLORHEXIDINE GLUCONATE 4 % EX LIQD: 60.0000 mL | Freq: Once | CUTANEOUS | Status: DC

## 2018-01-08 MED ORDER — METOCLOPRAMIDE HCL 10 MG PO TABS: 5.0000 mg | ORAL_TABLET | Freq: Three times a day (TID) | ORAL | Status: DC | PRN

## 2018-01-08 SURGICAL SUPPLY — 32 items
BANDAGE ACE 4X5 VEL STRL LF (GAUZE/BANDAGES/DRESSINGS) ×3 IMPLANT
BNDG COHESIVE 4X5 TAN STRL (GAUZE/BANDAGES/DRESSINGS) ×3 IMPLANT
BRUSH SCRUB 4% CHG (MISCELLANEOUS) ×3 IMPLANT
CANISTER SUCT 1200ML W/VALVE (MISCELLANEOUS) ×3 IMPLANT
CHLORAPREP W/TINT 26ML (MISCELLANEOUS) ×3 IMPLANT
CUFF TOURN 18 STER (MISCELLANEOUS) IMPLANT
CUFF TOURN 24 STER (MISCELLANEOUS) IMPLANT
DRAPE FLUOR MINI C-ARM 54X84 (DRAPES) ×1 IMPLANT
DRAPE INCISE IOBAN 66X45 STRL (DRAPES) ×1 IMPLANT
ELECT CAUTERY BLADE 6.4 (BLADE) ×3 IMPLANT
ELECT REM PT RETURN 9FT ADLT (ELECTROSURGICAL) ×3
ELECTRODE REM PT RTRN 9FT ADLT (ELECTROSURGICAL) ×1 IMPLANT
GAUZE PETRO XEROFOAM 1X8 (MISCELLANEOUS) ×3 IMPLANT
GAUZE SPONGE 4X4 12PLY STRL (GAUZE/BANDAGES/DRESSINGS) ×3 IMPLANT
GLOVE INDICATOR 8.0 STRL GRN (GLOVE) ×3 IMPLANT
GLOVE SURG ORTHO 8.0 STRL STRW (GLOVE) ×6 IMPLANT
GOWN STRL REUS W/ TWL LRG LVL3 (GOWN DISPOSABLE) ×1 IMPLANT
GOWN STRL REUS W/ TWL XL LVL3 (GOWN DISPOSABLE) ×1 IMPLANT
GOWN STRL REUS W/TWL LRG LVL3 (GOWN DISPOSABLE) ×3
GOWN STRL REUS W/TWL XL LVL3 (GOWN DISPOSABLE) ×3
KIT TURNOVER KIT A (KITS) ×3 IMPLANT
NDL FILTER BLUNT 18X1 1/2 (NEEDLE) ×1 IMPLANT
NEEDLE FILTER BLUNT 18X 1/2SAF (NEEDLE) ×2
NEEDLE FILTER BLUNT 18X1 1/2 (NEEDLE) ×1 IMPLANT
NS IRRIG 1000ML POUR BTL (IV SOLUTION) ×3 IMPLANT
PACK EXTREMITY ARMC (MISCELLANEOUS) ×3 IMPLANT
STAPLER SKIN PROX 35W (STAPLE) ×3 IMPLANT
STOCKINETTE M/LG 89821 (MISCELLANEOUS) ×3 IMPLANT
SUT PROLENE 4 0 PS 2 18 (SUTURE) ×3 IMPLANT
SUT VIC AB 2-0 SH 27 (SUTURE) ×6
SUT VIC AB 2-0 SH 27XBRD (SUTURE) ×2 IMPLANT
SYR 10ML LL (SYRINGE) ×3 IMPLANT

## 2018-01-08 NOTE — OR Nursing (Signed)
Rates pain 7/10 on arrival, advises he prefers percocet rx for home vs norco.  Discussed with Dr. Harlow Mares via tele, advises ok to give pt percocet he ordered for PACU in postop.  He advises he will keep home rx to [facility].  Notified patient of same.

## 2018-01-08 NOTE — Anesthesia Post-op Follow-up Note (Signed)
Anesthesia QCDR form completed.        

## 2018-01-08 NOTE — Anesthesia Preprocedure Evaluation (Signed)
Anesthesia Evaluation  Patient identified by MRN, date of birth, ID band Patient awake    Reviewed: Allergy & Precautions, H&P , NPO status , Patient's Chart, lab work & pertinent test results  History of Anesthesia Complications (+) AWARENESS UNDER ANESTHESIA and history of anesthetic complications  Airway Mallampati: III  TM Distance: <3 FB Neck ROM: full    Dental  (+) Chipped, Poor Dentition   Pulmonary neg pulmonary ROS, neg shortness of breath,           Cardiovascular Exercise Tolerance: Good (-) angina(-) Past MI and (-) DOE negative cardio ROS       Neuro/Psych PSYCHIATRIC DISORDERS Anxiety negative neurological ROS     GI/Hepatic Neg liver ROS, GERD  Medicated and Controlled,  Endo/Other  negative endocrine ROS  Renal/GU Renal disease     Musculoskeletal  (+) Arthritis ,   Abdominal   Peds  Hematology negative hematology ROS (+)   Anesthesia Other Findings Signs and symptoms suggestive of sleep apnea   Past Medical History: No date: Arthritis No date: Clavicular fracture No date: CLL (chronic lymphocytic leukemia) ([facility]) No date: Complication of anesthesia     Comment:  woke up during gallbladder surgery-pt states it takes               alot of anesthesia to sedate him  No date: Erectile dysfunction No date: GERD (gastroesophageal reflux disease) No date: History of kidney stones No date: Morbid obesity ([place]) No date: Urinary calculus  Past Surgical History: No date: ANTERIOR CRUCIATE LIGAMENT REPAIR No date: BICEPS TENDON REPAIR; Right 08-2015: BONE MARROW BIOPSY No date: CHOLECYSTECTOMY 09/16/2015: CYSTOSCOPY W/ RETROGRADES; Bilateral     Comment:  Procedure: CYSTOSCOPY WITH RETROGRADE PYELOGRAM;                Surgeon: Nickie Retort, MD;  Location: [facility] ORS;                Service: Urology;  Laterality: Bilateral;  BMI    Body Mass Index:  36.02 kg/m      Reproductive/Obstetrics negative OB ROS                            Anesthesia Physical Anesthesia Plan  ASA: III  Anesthesia Plan: General   Post-op Pain Management:    Induction: Intravenous  PONV Risk Score and Plan: 2 and Ondansetron, Dexamethasone and Midazolam  Airway Management Planned: LMA  Additional Equipment:   Intra-op Plan:   Post-operative Plan: Extubation in OR  Informed Consent: I have reviewed the patients History and Physical, chart, labs and discussed the procedure including the risks, benefits and alternatives for the proposed anesthesia with the patient or authorized representative who has indicated his/her understanding and acceptance.   Dental Advisory Given  Plan Discussed with: Anesthesiologist, CRNA and Surgeon  Anesthesia Plan Comments: (Patient consented for risks of anesthesia including but not limited to:  - adverse reactions to medications - damage to teeth, lips or other oral mucosa - sore throat or hoarseness - Damage to heart, brain, lungs or loss of life  Patient voiced understanding.)       Anesthesia Quick Evaluation

## 2018-01-08 NOTE — Transfer of Care (Signed)
Immediate Anesthesia Transfer of Care Note  Patient: Albert Sanders  Procedure(s) Performed: HARDWARE REMOVAL- DEEP HARDWARE (Left Knee)  Patient Location: PACU  Anesthesia Type:General  Level of Consciousness: awake, alert  and oriented  Airway & Oxygen Therapy: Patient Spontanous Breathing and Patient connected to face mask oxygen  Post-op Assessment: Report given to RN and Post -op Vital signs reviewed and stable  Post vital signs: Reviewed and stable  Last Vitals:  Vitals:   01/08/18 1231  BP: 133/81  Pulse: 70  Resp: 20  Temp: 36.6 C  SpO2: 97%    Last Pain:  Vitals:   01/08/18 1231  TempSrc: Oral  PainSc: 3       Patients Stated Pain Goal: 0 (35/46/56 8127)  Complications: No apparent anesthesia complications

## 2018-01-08 NOTE — Anesthesia Procedure Notes (Signed)
Procedure Name: LMA Insertion Date/Time: 01/08/2018 1:37 PM Performed by: Nelda Marseille, CRNA Pre-anesthesia Checklist: Patient identified, Patient being monitored, Timeout performed, Emergency Drugs available and Suction available Patient Re-evaluated:Patient Re-evaluated prior to induction Oxygen Delivery Method: Circle system utilized Preoxygenation: Pre-oxygenation with 100% oxygen Induction Type: IV induction Ventilation: Mask ventilation without difficulty LMA: LMA inserted Tube type: Oral Number of attempts: 1 Placement Confirmation: positive ETCO2 and breath sounds checked- equal and bilateral Tube secured with: Tape Dental Injury: Teeth and Oropharynx as per pre-operative assessment

## 2018-01-08 NOTE — Op Note (Signed)
  01/08/2018  1:57 PM  PATIENT:  Albert Sanders    PRE-OPERATIVE DIAGNOSIS:  M79.605 Pain in left leg G66.599J Oth Complication of other internal prosth dev/grft, init  POST-OPERATIVE DIAGNOSIS:  Same  PROCEDURE:  HARDWARE REMOVAL- DEEP HARDWARE, LEFT LEG  SURGEON:  Lovell Sheehan, MD  ANESTHESIA:   General  PREOPERATIVE INDICATIONS:  Albert Sanders is a  52 y.o. male with a diagnosis of M79.605 Pain in left leg T70.177L Oth Complication of other internal prosth dev/grft, init who  elected for surgical management of the painful hardware.  The risks benefits and alternatives were discussed with the patient preoperatively including but not limited to the risks of infection, bleeding, nerve injury, cardiopulmonary complications, the need for revision surgery, among others, and the patient was willing to proceed.  EBL: 5 CC  TOURNIQUET TIME: none used  OPERATIVE IMPLANTS: staple removed from left proximal tibia  OPERATIVE FINDINGS: Loosening of the tibial staple, extensive fibrosis and scar tissue  OPERATIVE PROCEDURE: After informed consent was obtained and the appropriate extremity marked in the pre-op area, the patient was taken to the operating room and placed in the supine position on the table. A timeout was performed. The left leg was prepped and draped in standard sterile fashion.   The skin and subcutaneous tissue were injected with 0.5% marcaine with epinephrine. The prior incision was carried out over the proximal medial leg. Sharp dissection was taken to the staple. The staple was removed and passed from the field. Using a rongeur, the fibrotic scar tissue and suture material were removed from the proximal medial leg. The wound was irrigated and closed with 2-0 vicryl and staples for the skin. A sterile dressing was applied. He tolerated the procedure well and was taken to the recovery room in good condition.  Elyn Aquas. Harlow Mares, MD

## 2018-01-08 NOTE — Discharge Instructions (Addendum)
May remove dressing on Wednesday and place band-aids.  Shower is OK, no tub bath or soaks Call with any questions, fever, drainage or shortness of breath to [phone]    AMBULATORY SURGERY  DISCHARGE INSTRUCTIONS   1) The drugs that you were given will stay in your system until tomorrow so for the next 24 hours you should not:  A) Drive an automobile B) Make any legal decisions C) Drink any alcoholic beverage   2) You may resume regular meals tomorrow.  Today it is better to start with liquids and gradually work up to solid foods.  You may eat anything you prefer, but it is better to start with liquids, then soup and crackers, and gradually work up to solid foods.   3) Please notify your doctor immediately if you have any unusual bleeding, trouble breathing, redness and pain at the surgery site, drainage, fever, or pain not relieved by medication.    4) Additional Instructions:        Please contact your physician with any problems or Same Day Surgery at [phone], Monday through Friday 6 am to 4 pm, or [place] at [facility] number at [phone].

## 2018-01-08 NOTE — H&P (Signed)
The patient has been re-examined, and the chart reviewed, and there have been no interval changes to the documented history and physical.  Plan a left tibia hardware removal today.  Anesthesia is not consulted regarding a peripheral nerve block for post-operative pain.  The risks, benefits, and alternatives have been discussed at length, and the patient is willing to proceed.     

## 2018-01-09 NOTE — Anesthesia Postprocedure Evaluation (Signed)
Anesthesia Post Note  Patient: Albert Sanders  Procedure(s) Performed: HARDWARE REMOVAL- DEEP HARDWARE (Left Knee)  Patient location during evaluation: PACU Anesthesia Type: General Level of consciousness: awake and alert Pain management: pain level controlled Vital Signs Assessment: post-procedure vital signs reviewed and stable Respiratory status: spontaneous breathing, nonlabored ventilation, respiratory function stable and patient connected to nasal cannula oxygen Cardiovascular status: blood pressure returned to baseline and stable Postop Assessment: no apparent nausea or vomiting Anesthetic complications: no     Last Vitals:  Vitals:   01/08/18 1434 01/08/18 1502  BP: (!) 151/73 134/72  Pulse: 80 74  Resp: 16 16  Temp: (!) 35.9 C (!) 36.2 C  SpO2: 100% 99%    Last Pain:  Vitals:   01/08/18 1502  TempSrc: Temporal  PainSc: 5                  Precious Haws Piscitello

## 2018-02-21 ENCOUNTER — Ambulatory Visit: Payer: Self-pay | Admitting: Internal Medicine

## 2018-02-21 DIAGNOSIS — R0989 Other specified symptoms and signs involving the circulatory and respiratory systems: Secondary | ICD-10-CM

## 2018-02-22 ENCOUNTER — Encounter: Payer: Self-pay | Admitting: Internal Medicine

## 2018-06-21 ENCOUNTER — Inpatient Hospital Stay: Payer: Self-pay | Admitting: Hematology and Oncology

## 2018-06-21 ENCOUNTER — Inpatient Hospital Stay: Payer: Self-pay | Attending: Hematology and Oncology

## 2018-06-21 NOTE — Progress Notes (Deleted)
[facility] day:  06/21/2018   Chief Complaint: Albert Sanders is a 52 y.o. male with chronic lymphocytic leukemia who is seen for 6 month assessment.  HPI: The patient was last seen in the medical oncology clinic on 12/22/2017.  At that time, he denied any B symptoms.  He denied any infections, bruising or bleeding.  He was scheduled for left knee surgery.  Exam revealed no adenopathy or hepatosplenomegaly.  Hemoglobin and platelet count were normal.  WBC was mildly elevated (12,000) with an [place] is 6300.  ALC was 4500.  Observation continued.  He underwent removal of deep hardware in the left leg on 01/08/2018 by Dr. Kurtis Bushman.    Past Medical History:  Diagnosis Date  . Arthritis   . Clavicular fracture   . CLL (chronic lymphocytic leukemia) ([place])   . Complication of anesthesia    woke up during gallbladder surgery-pt states it takes alot of anesthesia to sedate him   . Erectile dysfunction   . GERD (gastroesophageal reflux disease)   . History of kidney stones   . Morbid obesity ([place])   . Urinary calculus     Past Surgical History:  Procedure Laterality Date  . ANTERIOR CRUCIATE LIGAMENT REPAIR    . BICEPS TENDON REPAIR Right   . BONE MARROW BIOPSY  08-2015  . CHOLECYSTECTOMY    . CYSTOSCOPY W/ RETROGRADES Bilateral 09/16/2015   Procedure: CYSTOSCOPY WITH RETROGRADE PYELOGRAM;  Surgeon: Nickie Retort, MD;  Location: [facility] ORS;  Service: Urology;  Laterality: Bilateral;  . HARDWARE REMOVAL Left 01/08/2018   Procedure: HARDWARE REMOVAL- DEEP HARDWARE;  Surgeon: Lovell Sheehan, MD;  Location: [facility] ORS;  Service: Orthopedics;  Laterality: Left;    Family History  Problem Relation Age of Onset  . Diabetes Paternal Uncle   . Cancer Paternal Grandmother        breast  . Diabetes Paternal Grandfather   . CAD Maternal Grandmother   . Heart disease Neg Hx   . Hypertension Neg Hx   . Stroke Neg Hx   . COPD Neg Hx      Social History:  reports that he has never smoked. His smokeless tobacco use includes snuff. He reports that he drinks alcohol. He reports that he does not use drugs.  He notes exposure to various toxins. He dips 1 can a day. He rarely drinks alcohol.  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  He works as a Development worker, community.  He lives in [place].  He is alone today.  Allergies:  Allergies  Allergen Reactions  . Contrast Media [Iodinated Diagnostic Agents] Anaphylaxis    Current Medications: Current Outpatient Medications  Medication Sig Dispense Refill  . docusate sodium (COLACE) 100 MG capsule Take 1 capsule (100 mg total) by mouth daily as needed. 30 capsule 2  . HYDROcodone-acetaminophen (NORCO/VICODIN) 5-325 MG tablet Take 1 tablet by mouth every 4 (four) hours as needed for moderate pain. 20 tablet 0  . ibuprofen (ADVIL,MOTRIN) 200 MG tablet Take 800 mg by mouth 3 (three) times daily as needed for headache or moderate pain.    Marland Kitchen omeprazole (PRILOSEC OTC) 20 MG tablet Take 1 tablet (20 mg total) by mouth every morning. (Patient taking differently: Take 40 mg by mouth every morning. ) 30 tablet 1  . oxymetazoline (AFRIN) 0.05 % nasal spray Place 1 spray into both nostrils daily as needed for congestion.    . sildenafil (REVATIO) 20 MG tablet Take 1-2  tablets (20-40 mg total) by mouth daily as needed. (Patient taking differently: Take 80 mg by mouth daily as needed (erectile dysfunction). ) 20 tablet 0   No current facility-administered medications for this visit.     Review of Systems:  GENERAL:  Feels "pretty good".  "Dog tired in the evening after working".  No fevers.  No night sweats in "a long time".  Weight up and down, typically 270 - 275 pounds. PERFORMANCE STATUS (ECOG):  0 HEENT:  No runny nose, mouth sores or tenderness. Lungs: No shortness of breath or cough.  No hemoptysis. Cardiac:  No chest pain, palpitations, orthopnea, or PND. GI:  No nausea, vomiting, diarrhea, melena or  hematochezia. GU:  Low testosterone.  No urgency, frequency, dysuria, or hematuria. Musculoskeletal:  Chronic back issues.  No joint pain.  No muscle tenderness. Extremities:  No pain or swelling.  Interval parrot bite and infection (see HPI). Skin:  No rashes or skin changes. Neuro:  No headache, numbness or weakness, balance or coordination issues. Endocrine:  No diabetes, thyroid issues, hot flashes or night sweats. Psych:  No mood changes, depression or anxiety. Pain:  No focal pain. Review of systems:  All other systems reviewed and found to be negative.  Physical Exam: There were no vitals taken for this visit. GENERAL:  Well developed, well nourished, heavy set gentleman sitting comfortably in the exam room in no acute distress. MENTAL STATUS:  Alert and oriented to person, place and time. HEAD:  Short gray hair.  Normocephalic, atraumatic, face symmetric, no Cushingoid features. EYES:  Blue eyes.  Pupils equal round and reactive to light and accomodation.  No conjunctivitis or scleral icterus. ENT:  Oropharynx clear without lesion.  Tongue normal. Mucous membranes moist.  RESPIRATORY:  Clear to auscultation without rales, wheezes or rhonchi. CARDIOVASCULAR:  Regular rate and rhythm without murmur, rub or gallop. ABDOMEN:  Fully round.  Soft, non-tender, with active bowel sounds, and no appreciable hepatosplenomegaly.  No masses. SKIN:  No rashes, ulcers or lesions. EXTREMITIES: No edema, no skin discoloration or tenderness.  No palpable cords. LYMPH NODES: No palpable cervical, supraclavicular, axillary or inguinal adenopathy  NEUROLOGICAL: Unremarkable. PSYCH:  Appropriate.   GENERAL:  Well developed, well nourished, gentleman sitting comfortably in the exam room in no acute distress. MENTAL STATUS:  Alert and oriented to person, place and time. HEAD:  *** hair.  Normocephalic, atraumatic, face symmetric, no Cushingoid features. EYES:  *** eyes.  Pupils equal round and reactive  to light and accomodation.  No conjunctivitis or scleral icterus. ENT:  Oropharynx clear without lesion.  Tongue normal. Mucous membranes moist.  RESPIRATORY:  Clear to auscultation without rales, wheezes or rhonchi. CARDIOVASCULAR:  Regular rate and rhythm without murmur, rub or gallop. ABDOMEN:  Soft, non-tender, with active bowel sounds, and no hepatosplenomegaly.  No masses. SKIN:  No rashes, ulcers or lesions. EXTREMITIES: No edema, no skin discoloration or tenderness.  No palpable cords. LYMPH NODES: No palpable cervical, supraclavicular, axillary or inguinal adenopathy  NEUROLOGICAL: Unremarkable. PSYCH:  Appropriate.    No visits with results within 3 Day(s) from this visit.  Latest known visit with results is:  Appointment on 12/22/2017  Component Date Value Ref Range Status  . Uric Acid, Serum 12/22/2017 6.0  4.4 - 7.6 mg/dL Final   Performed at [facility], [address] [place], [place] [address]  . LDH 12/22/2017 115  98 - 192 U/L Final   Performed at [facility], [address],  [place], [place] [address]  . Sodium 12/22/2017 134* 135 - 145 mmol/L Final  . Potassium 12/22/2017 4.2  3.5 - 5.1 mmol/L Final  . Chloride 12/22/2017 98* 101 - 111 mmol/L Final  . CO2 12/22/2017 29  22 - 32 mmol/L Final  . Glucose, Bld 12/22/2017 122* 65 - 99 mg/dL Final  . BUN 12/22/2017 15  6 - 20 mg/dL Final  . Creatinine, Ser 12/22/2017 0.78  0.61 - 1.24 mg/dL Final  . Calcium 12/22/2017 9.3  8.9 - 10.3 mg/dL Final  . Total Protein 12/22/2017 7.5  6.5 - 8.1 g/dL Final  . Albumin 12/22/2017 4.2  3.5 - 5.0 g/dL Final  . AST 12/22/2017 31  15 - 41 U/L Final  . ALT 12/22/2017 44  17 - 63 U/L Final  . Alkaline Phosphatase 12/22/2017 89  38 - 126 U/L Final  . Total Bilirubin 12/22/2017 1.0  0.3 - 1.2 mg/dL Final  . GFR calc non Af Amer 12/22/2017 >60  >60 mL/min Final  . GFR calc Af Amer 12/22/2017 >60  >60 mL/min Final   Comment: (NOTE) The eGFR has been calculated using  the CKD EPI equation. This calculation has not been validated in all clinical situations. eGFR's persistently <60 mL/min signify possible Chronic Kidney Disease.   Georgiann Hahn gap 12/22/2017 7  5 - 15 Final   Performed at [facility], [address] [place], [place] [address]  . WBC 12/22/2017 12.0* 3.8 - 10.6 K/uL Final  . RBC 12/22/2017 5.47  4.40 - 5.90 MIL/uL Final  . Hemoglobin 12/22/2017 14.2  13.0 - 18.0 g/dL Final  . HCT 12/22/2017 44.3  40.0 - 52.0 % Final  . MCV 12/22/2017 80.9  80.0 - 100.0 fL Final  . MCH 12/22/2017 26.0  26.0 - 34.0 pg Final  . MCHC 12/22/2017 32.1  32.0 - 36.0 g/dL Final  . RDW 12/22/2017 14.1  11.5 - 14.5 % Final  . Platelets 12/22/2017 201  150 - 440 K/uL Final  . Neutrophils Relative % 12/22/2017 53  % Final  . Neutro Abs 12/22/2017 6.3  1.4 - 6.5 K/uL Final  . Lymphocytes Relative 12/22/2017 38  % Final  . Lymphs Abs 12/22/2017 4.5* 1.0 - 3.6 K/uL Final  . Monocytes Relative 12/22/2017 6  % Final  . Monocytes Absolute 12/22/2017 0.7  0.2 - 1.0 K/uL Final  . Eosinophils Relative 12/22/2017 1  % Final  . Eosinophils Absolute 12/22/2017 0.1  0 - 0.7 K/uL Final  . Basophils Relative 12/22/2017 2  % Final  . Basophils Absolute 12/22/2017 0.3* 0 - 0.1 K/uL Final   Performed at [facility], [address], [place], [place] [address]  . IgG (Immunoglobin G), Serum 12/22/2017 1,175  700 - 1,600 mg/dL Final  . IgA 12/22/2017 298  90 - 386 mg/dL Final  . IgM (Immunoglobulin M), Srm 12/22/2017 101  20 - 172 mg/dL Final   Comment: (NOTE) Performed At: [facility] [place], [place] [id number] Rush Farmer MD ZH:[id number] Performed at [facility] [address] [place], [place] [address]     Assessment:  Albert Sanders is a 52 y.o. 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  He was noted to have abdominal adenopathy and mild splenomegaly on CT scan in 02/2015 following a fall.  He represented with  abdominal pain, fever, and sweats.  Abdominal and pelvic CT scan without contrast on 07/25/2015 revealed abdominal adenopathy with the largest lymph node in the portacaval  region measuring 1.9 cm (stable).  There was mild splenomegaly (15 cm).    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  Labs on 08/07/2015 revealed a hematocrit of 43, hemoglobin 13.9, platelets 200,000, white count 13,000 with an ANC of 500. Differential included 50% segs and 42% lymphs with a mild lymphocytosis (5500).  Comprehensive metabolic, LDH, uric acid, sedimentation rate, SPEP, and immunoglobulins were normal.  PET scan on 08/07/2015 revealed no hypermetabolic lymphadenopathy or metastatic disease. There was a mildly enlarged gastrohepatic ligament, portacaval, and right external iliac lymph nodes (1.3-1.9 cm).  He had a top normal spleen size.   Bone marrow on 09/04/2015 revealed <10% involvement with B-cell lymphoproliferative neoplasm with a B-cell CLL/SLL immunophenotype.  There was mild non-specific dyserythropoiesis and mild increase in reticulin.  Storage iron was present. Flow cytometry revealed CD5+ monotypic (clonal) B cell population (20% of sample) with B-cell CLL/SLL immunophenotype.  Cytogenetics were normal (46, XY).  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  He has had infections.  He had a severe flexor tenosynovitis of the right 4th finger in 08/2015 after being bitten by a parrot.  He had debridement, antibiotics, and a skin graft.  He had an infection overlying his right wrist recently (unroofed by patient).  He has a history of anaphylaxis  to contrast dye.  Cystoscopy with retrograde pyelogram on 09/16/2015 revealed no abnormalities.  Testosterone was 113 [phone] on 08/28/2015.    Symptomatically, he denies any B symptoms.  He denies any infections, bruising or bleeding.  He is scheduled for left knee surgery.  Exam reveals no adenopathy or hepatosplenomegaly.  Hemoglobin and platelet count are normal.  WBC is mildly elevated (12,000) with an [place] is 6300.  ALC is 4500.  Plan: 1.  Labs today:  CBC with diff, CMP, LDH, uric acid.   2.  Discuss interim events.  Patient doing well.  No indications for treatment. 3.  RTC in 6 months for MD assessment and labs (CBC with diff, CMP, LDH, uric acid).   Lequita Asal, MD  06/21/2018, 3:57 AM  I saw and evaluated the patient, participating in the key portions of the service and reviewing pertinent diagnostic studies and records.  I reviewed the nurse practitioner's note and agree with the findings and the plan.  The assessment and plan were discussed with the patient.  Additional diagnostic studies of *** are needed to clarify *** and would change the clinical management.  A few ***multiple questions were asked by the patient and answered.   Nolon Stalls, MD 06/21/2018,3:57 AM

## 2018-07-22 ENCOUNTER — Emergency Department: Payer: Self-pay

## 2018-07-22 ENCOUNTER — Other Ambulatory Visit: Payer: Self-pay

## 2018-07-22 ENCOUNTER — Emergency Department
Admission: EM | Admit: 2018-07-22 | Discharge: 2018-07-22 | Discharge status: Against Medical Advice | Payer: Self-pay | Attending: Emergency Medicine | Admitting: Emergency Medicine

## 2018-07-22 DIAGNOSIS — N182 Chronic kidney disease, stage 2 (mild): Secondary | ICD-10-CM | POA: Insufficient documentation

## 2018-07-22 DIAGNOSIS — Z789 Other specified health status: Secondary | ICD-10-CM

## 2018-07-22 DIAGNOSIS — I469 Cardiac arrest, cause unspecified: Secondary | ICD-10-CM | POA: Insufficient documentation

## 2018-07-22 DIAGNOSIS — R092 Respiratory arrest: Secondary | ICD-10-CM | POA: Insufficient documentation

## 2018-07-22 DIAGNOSIS — T401X1A Poisoning by heroin, accidental (unintentional), initial encounter: Secondary | ICD-10-CM | POA: Insufficient documentation

## 2018-07-22 LAB — CBC
HCT: 40 % (ref 40.0–52.0)
HEMOGLOBIN: 13 g/dL (ref 13.0–18.0)
MCH: 24.9 pg — AB (ref 26.0–34.0)
MCHC: 32.6 g/dL (ref 32.0–36.0)
MCV: 76.3 fL — ABNORMAL LOW (ref 80.0–100.0)
PLATELETS: 214 10*3/uL (ref 150–440)
RBC: 5.24 MIL/uL (ref 4.40–5.90)
RDW: 16.4 % — ABNORMAL HIGH (ref 11.5–14.5)
WBC: 19.4 10*3/uL — ABNORMAL HIGH (ref 3.8–10.6)

## 2018-07-22 LAB — BASIC METABOLIC PANEL
ANION GAP: 6 (ref 5–15)
BUN: 20 mg/dL (ref 6–20)
CO2: 27 mmol/L (ref 22–32)
CREATININE: 1.05 mg/dL (ref 0.61–1.24)
Calcium: 8 mg/dL — ABNORMAL LOW (ref 8.9–10.3)
Chloride: 102 mmol/L (ref 98–111)
Glucose, Bld: 182 mg/dL — ABNORMAL HIGH (ref 70–99)
Potassium: 3.6 mmol/L (ref 3.5–5.1)
SODIUM: 135 mmol/L (ref 135–145)

## 2018-07-22 LAB — TROPONIN I

## 2018-07-22 MED ORDER — IBUPROFEN 800 MG PO TABS
ORAL_TABLET | ORAL | Status: AC
  Administered 2018-07-22: 800 mg via ORAL
  Filled 2018-07-22: qty 1

## 2018-07-22 MED ORDER — IBUPROFEN 800 MG PO TABS
800.0000 mg | ORAL_TABLET | Freq: Once | ORAL | Status: AC
  Administered 2018-07-22: 800 mg via ORAL

## 2018-07-22 MED ORDER — SODIUM CHLORIDE 0.9 % IV BOLUS
1000.0000 mL | Freq: Once | INTRAVENOUS | Status: AC
  Administered 2018-07-22: 1000 mL via INTRAVENOUS

## 2018-07-22 NOTE — Discharge Instructions (Signed)
As I explained to you, heroin and its additives have a much longer effect in there body than narcan does. Your were found dead today and you are lucky to be alive. I recommended admission for you to be monitored for recurrence of overdose but you refused although you know that you can die from this or have severe brain injury. Please make sure to follow up with your doctor in the morning.  You are welcome to return to the emergency room at any time if you change your mind.  You are provided with a prescription for Narcan in case you start having symptoms of overdose again.

## 2018-07-22 NOTE — ED Provider Notes (Signed)
[facility] Emergency Department Provider Note  ____________________________________________  Time seen: Approximately 7:08 PM  I have reviewed the triage vital signs and the nursing notes.   HISTORY  Chief Complaint Drug Overdose   HPI Albert Sanders is a 52 y.o. male with a history of heroin abuse who presents for a drug overdose.  Patient reports that he injected some heroin earlier today.  He was found inside his car cyanotic and pulseless by a bystander.  When police arrived at the scene they started CPR and gave 2 mg of intranasal Narcan.  After 2 rounds of 2 mg intranasal Narcan and 3 rounds of CPR patient regained pulses.  Patient arrives with a GCS of 15.  Patient denies any prior history of overdose.  Patient denies suicidal intention.  Patient reports that the overdose was accidental.  Per EMS patient wanted to go home but police threatened to take him to jail if he did not come to the emergency room.  Patient is now complaining of sharp pain diffuse across his chest that is worse with inspiration.  The pain started after he received CPR.  Past Medical History:  Diagnosis Date  . Arthritis   . Clavicular fracture   . CLL (chronic lymphocytic leukemia) ([place])   . Complication of anesthesia    woke up during gallbladder surgery-pt states it takes alot of anesthesia to sedate him   . Erectile dysfunction   . GERD (gastroesophageal reflux disease)   . History of kidney stones   . Morbid obesity ([place])   . Urinary calculus     Patient Active Problem List   Diagnosis Date Noted  . Prediabetes 11/08/2017  . Pre-op examination 11/07/2017  . Encounter for pain management counseling 11/07/2017  . Osteoarthritis of knee 10/03/2017  . Strain of knee 10/03/2017  . Chronic kidney disease, 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  . Hyperglycemia 10/07/2016  . Prostate cancer screening 10/07/2016  . Fatigue 10/07/2016  . Snoring 10/07/2016  . Erectile dysfunction  10/07/2016  . Vitamin D deficiency 10/06/2015  . Tenosynovitis 09/27/2015  . GERD (gastroesophageal reflux disease) 09/27/2015  . Low back pain 09/26/2015  . Obesity 09/26/2015  . Abnormal urine 09/21/2015  . Anxiety about health 09/21/2015  . Testosterone deficiency 09/18/2015  . Urinary calculus   . Clavicular fracture   . Flank pain 08/28/2015  . Lymphocytosis 08/20/2015  . Chronic lymphocytic leukemia ([place]) 08/20/2015  . Adenopathy 07/31/2015  . Night sweats 07/31/2015  . Splenomegaly 07/31/2015    Past Surgical History:  Procedure Laterality Date  . ANTERIOR CRUCIATE LIGAMENT REPAIR    . BICEPS TENDON REPAIR Right   . BONE MARROW BIOPSY  08-2015  . CHOLECYSTECTOMY    . CYSTOSCOPY W/ RETROGRADES Bilateral 09/16/2015   Procedure: CYSTOSCOPY WITH RETROGRADE PYELOGRAM;  Surgeon: Nickie Retort, MD;  Location: [facility] ORS;  Service: Urology;  Laterality: Bilateral;  . HARDWARE REMOVAL Left 01/08/2018   Procedure: HARDWARE REMOVAL- DEEP HARDWARE;  Surgeon: Lovell Sheehan, MD;  Location: [facility] ORS;  Service: Orthopedics;  Laterality: Left;    Prior to Admission medications   Medication Sig Start Date End Date Taking? Authorizing Provider  docusate sodium (COLACE) 100 MG capsule Take 1 capsule (100 mg total) by mouth daily as needed. 01/08/18 01/08/19 Yes Lovell Sheehan, MD  ibuprofen (ADVIL,MOTRIN) 200 MG tablet Take 800 mg by mouth 3 (three) times daily as needed for headache or moderate pain.   Yes [provider]  omeprazole (PRILOSEC OTC)  20 MG tablet Take 1 tablet (20 mg total) by mouth every morning. Patient taking differently: Take 40 mg by mouth every morning.  10/06/16  Yes Lada, Satira Anis, MD  oxymetazoline (AFRIN) 0.05 % nasal spray Place 1 spray into both nostrils daily as needed for congestion.   Yes [provider]  sildenafil (REVATIO) 20 MG tablet Take 1-2 tablets (20-40 mg total) by mouth daily as needed. Patient taking differently: Take 80 mg by  mouth daily as needed (erectile dysfunction).  11/07/17  Yes Lada, Satira Anis, MD  HYDROcodone-acetaminophen (NORCO/VICODIN) 5-325 MG tablet Take 1 tablet by mouth every 4 (four) hours as needed for moderate pain. Patient not taking: Reported on 07/22/2018 01/08/18 01/08/19  Lovell Sheehan, MD    Allergies Contrast media [iodinated diagnostic agents]  Family History  Problem Relation Age of Onset  . Diabetes Paternal Uncle   . Cancer Paternal Grandmother        breast  . Diabetes Paternal Grandfather   . CAD Maternal Grandmother   . Heart disease Neg Hx   . Hypertension Neg Hx   . Stroke Neg Hx   . COPD Neg Hx     Social History Social History   Tobacco Use  . Smoking status: Never Smoker  . Smokeless tobacco: Current User    Types: Snuff  Substance Use Topics  . Alcohol use: Yes    Comment: rare  . Drug use: Yes    Types: IV    Comment: heroin x few years     Review of Systems  Constitutional: Negative for fever. Eyes: Negative for visual changes. ENT: Negative for sore throat. Neck: No neck pain  Cardiovascular: + chest pain. Respiratory: Negative for shortness of breath. Gastrointestinal: Negative for abdominal pain, vomiting or diarrhea. Genitourinary: Negative for dysuria. Musculoskeletal: Negative for back pain. Skin: Negative for rash. Neurological: Negative for headaches, weakness or numbness. Psych: No SI or HI  ____________________________________________   PHYSICAL EXAM:  VITAL SIGNS: ED Triage Vitals  Enc Vitals Group     BP 07/22/18 1856 (!) 132/92     Pulse Rate 07/22/18 1856 (!) 119     Resp 07/22/18 1856 18     Temp 07/22/18 1856 99.2 F (37.3 C)     Temp Source 07/22/18 1856 Oral     SpO2 07/22/18 1856 (!) 88 %     Weight 07/22/18 1855 290 lb (131.5 kg)     Height 07/22/18 1855 _0  (1.854 m)     Head Circumference --      Peak Flow --      Pain Score 07/22/18 1852 0     Pain Loc --      Pain Edu? --      Excl. in [place]? --      Constitutional: Alert and oriented. Well appearing and in no apparent distress. HEENT:      Head: Normocephalic and atraumatic.         Eyes: Conjunctivae are normal. Sclera is non-icteric.       Mouth/Throat: Mucous membranes are moist.       Neck: Supple with no signs of meningismus. Cardiovascular: Tachycardic with regular. No murmurs, gallops, or rubs. 2+ symmetrical distal pulses are present in all extremities. No JVD. Respiratory: Normal respiratory effort. Lungs are clear to auscultation bilaterally. No wheezes, crackles, or rhonchi.  Gastrointestinal: Soft, non tender, and non distended with positive bowel sounds. No rebound or guarding. Musculoskeletal: Nontender with normal range of motion in all  extremities. No edema, cyanosis, or erythema of extremities. Neurologic: Normal speech and language. Face is symmetric. Moving all extremities. No gross focal neurologic deficits are appreciated. Skin: Skin is warm, dry and intact. No rash noted. Psychiatric: Mood and affect are normal. Speech and behavior are normal.  ____________________________________________   LABS (all labs ordered are listed, but only abnormal results are displayed)  Labs Reviewed  BASIC METABOLIC PANEL - Abnormal; Notable for the following components:      Result Value   Glucose, Bld 182 (*)    Calcium 8.0 (*)    All other components within normal limits  CBC - Abnormal; Notable for the following components:   WBC 19.4 (*)    MCV 76.3 (*)    MCH 24.9 (*)    RDW 16.4 (*)    All other components within normal limits  TROPONIN I   ____________________________________________  EKG  ED ECG REPORT I, Rudene Re, the attending physician, personally viewed and interpreted this ECG.  Sinus tachycardia, rate of 121, normal intervals, normal axis, no ST elevations or depressions.  Sinus tachycardia is new when compared to prior but other than that no acute  changes. ____________________________________________  RADIOLOGY  I have personally reviewed the images performed during this visit and I agree with the Radiologist's read.   Interpretation by Radiologist:  Dg Chest 1 View  Result Date: 07/22/2018 CLINICAL DATA:  Patient found slumped over. EXAM: CHEST  1 VIEW COMPARISON:  July 25, 2015 FINDINGS: Transcutaneous pacer partially obscures the left chest. No acute abnormalities seen in the chest. IMPRESSION: No active disease. Electronically Signed   By: Dorise Bullion III M.D   On: 07/22/2018 19:42      ____________________________________________   PROCEDURES  Procedure(s) performed: None Procedures Critical Care performed:  None ____________________________________________   INITIAL IMPRESSION / ASSESSMENT AND PLAN / ED COURSE   52 y.o. male with a history of heroin abuse who presents for an unintentional drug overdose on heroin causing respiratory and cardiac arrest. Status post CPR and narcan on the field. Now GCS 15, HD stable. Denies SI. Patient asking to be discharged from the minute he arrived and says he is here because police gave him the option to either come here or go to jail.  I explained to patient that heroin and whatever additives were put on it has a much longer half-life than Narcan and that his symptoms of respiratory arrest may return at any time.  I recommended admission to the hospital for monitoring.  Patient is refusing admission.  I will monitor him in the emergency room.  Labs and chest x-ray are being done right now.  I told patient that he needs a sober ride who knows the risks of taking him out of the emergency room [place] for him to be discharged.  Clinical Course as of Jul 23 2031  Nancy Fetter Jul 22, 2018  2017 Chest x-ray with no evidence of pneumothorax.  White count is elevated due to stress response from respiratory arrest and CPR. Patient continues to refuse admission to the Hospital and  wishes to be discharged.  Patient understands the risks of leaving the hospital which include recurrence of respiratory and cardiac arrest.  I explained to him that the half-life of heroin and its additives is much longer than that of Narcan and that the symptoms of overdose can recur.  I recommended admission for 24-hour monitoring.  Patient understands the risks but continues to wish to be discharged.  I  told him that I will discharge him [place] if he had a sober driver to come and pick him up.  He is making phone calls at this time.  He remains with a GCS of 15.    [CV]    Clinical Course User Index [CV] Alfred Levins [place], MD     As part of my medical decision making, I reviewed the following data within the [place] notes reviewed and incorporated, Labs reviewed , EKG interpreted , Old EKG reviewed, Old chart reviewed, Radiograph reviewed , Notes from prior ED visits and [place] Controlled Substance Database    Pertinent labs & imaging results that were available during my care of the patient were reviewed by me and considered in my medical decision making (see chart for details).    ____________________________________________   FINAL CLINICAL IMPRESSION(S) / ED DIAGNOSES  Final diagnoses:  Accidental overdose of heroin, initial encounter ([place])  Respiratory arrest ([place])  Cardiac arrest ([place])      NEW MEDICATIONS STARTED DURING THIS VISIT:  ED Discharge Orders    None       Note:  This document was prepared using Dragon voice recognition software and may include unintentional dictation errors.    Rudene Re, MD 07/22/18 2032

## 2018-07-22 NOTE — ED Notes (Addendum)
Pt states that he does NOT want any more narcan. States "I don't like it." when asked if pt goes unresponsive again what he wants done he states he does NOT want narcan, he wants to "go out." pt is alert, oriented, answering questions appropriately. Megan RN and Radio broadcast assistant, along with this Probation officer all heard pt verbalizing this.   Megan RN and Cassie RN attempting to obtain IV access at this time, pt telling RN's where best veins are.

## 2018-07-22 NOTE — ED Notes (Signed)
Pt placed on 3L [place]

## 2018-07-22 NOTE — ED Notes (Signed)
Pt oxygen 90%, increased to 4 L [place]. Will continue to monitor.   For the 2nd time pt is asking when he can go home, informed that narcan wears off quicker than heroin. Dr. Alfred Levins has talked to pt about this and Megan RN discussed this with pt. Dr. Alfred Levins informed pt that she will most likely admit pt to observe pt breathing and pulse overnight. Pt asking about if he will be arrested by police. Informed pt that ED staff can not alert police about pt DC or admit.
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
## 2018-09-24 ENCOUNTER — Emergency Department: Payer: Self-pay

## 2018-09-24 ENCOUNTER — Inpatient Hospital Stay
Admission: EM | Admit: 2018-09-24 | Discharge: 2018-09-26 | DRG: 917 | Disposition: A | Discharge status: Home / Self Care | Payer: Self-pay | Attending: Family Medicine | Admitting: Family Medicine

## 2018-09-24 ENCOUNTER — Encounter: Payer: Self-pay | Admitting: Emergency Medicine

## 2018-09-24 ENCOUNTER — Other Ambulatory Visit: Payer: Self-pay

## 2018-09-24 DIAGNOSIS — R7303 Prediabetes: Secondary | ICD-10-CM | POA: Diagnosis present

## 2018-09-24 DIAGNOSIS — T40601A Poisoning by unspecified narcotics, accidental (unintentional), initial encounter: Secondary | ICD-10-CM

## 2018-09-24 DIAGNOSIS — K219 Gastro-esophageal reflux disease without esophagitis: Secondary | ICD-10-CM | POA: Diagnosis present

## 2018-09-24 DIAGNOSIS — T401X1A Poisoning by heroin, accidental (unintentional), initial encounter: Principal | ICD-10-CM | POA: Diagnosis present

## 2018-09-24 DIAGNOSIS — I129 Hypertensive chronic kidney disease with stage 1 through stage 4 chronic kidney disease, or unspecified chronic kidney disease: Secondary | ICD-10-CM | POA: Diagnosis present

## 2018-09-24 DIAGNOSIS — N182 Chronic kidney disease, stage 2 (mild): Secondary | ICD-10-CM | POA: Diagnosis present

## 2018-09-24 DIAGNOSIS — M1990 Unspecified osteoarthritis, unspecified site: Secondary | ICD-10-CM | POA: Diagnosis present

## 2018-09-24 DIAGNOSIS — Z720 Tobacco use: Secondary | ICD-10-CM

## 2018-09-24 DIAGNOSIS — Z833 Family history of diabetes mellitus: Secondary | ICD-10-CM

## 2018-09-24 DIAGNOSIS — Z91041 Radiographic dye allergy status: Secondary | ICD-10-CM

## 2018-09-24 DIAGNOSIS — E559 Vitamin D deficiency, unspecified: Secondary | ICD-10-CM | POA: Diagnosis present

## 2018-09-24 DIAGNOSIS — T401X4A Poisoning by heroin, undetermined, initial encounter: Secondary | ICD-10-CM

## 2018-09-24 DIAGNOSIS — Z8249 Family history of ischemic heart disease and other diseases of the circulatory system: Secondary | ICD-10-CM

## 2018-09-24 DIAGNOSIS — F1110 Opioid abuse, uncomplicated: Secondary | ICD-10-CM

## 2018-09-24 DIAGNOSIS — J189 Pneumonia, unspecified organism: Secondary | ICD-10-CM | POA: Diagnosis present

## 2018-09-24 DIAGNOSIS — J690 Pneumonitis due to inhalation of food and vomit: Secondary | ICD-10-CM | POA: Diagnosis present

## 2018-09-24 DIAGNOSIS — Z87442 Personal history of urinary calculi: Secondary | ICD-10-CM

## 2018-09-24 DIAGNOSIS — C9110 Chronic lymphocytic leukemia of B-cell type not having achieved remission: Secondary | ICD-10-CM | POA: Diagnosis present

## 2018-09-24 DIAGNOSIS — Z9049 Acquired absence of other specified parts of digestive tract: Secondary | ICD-10-CM

## 2018-09-24 DIAGNOSIS — F1910 Other psychoactive substance abuse, uncomplicated: Secondary | ICD-10-CM

## 2018-09-24 DIAGNOSIS — Z809 Family history of malignant neoplasm, unspecified: Secondary | ICD-10-CM

## 2018-09-24 LAB — LACTIC ACID, PLASMA: Lactic Acid, Venous: 1.5 mmol/L (ref 0.5–1.9)

## 2018-09-24 LAB — CBC WITH DIFFERENTIAL/PLATELET
ABS IMMATURE GRANULOCYTES: 0.23 10*3/uL — AB (ref 0.00–0.07)
BASOS PCT: 0 %
Basophils Absolute: 0.1 10*3/uL (ref 0.0–0.1)
EOS ABS: 0 10*3/uL (ref 0.0–0.5)
Eosinophils Relative: 0 %
HEMATOCRIT: 48.1 % (ref 39.0–52.0)
Hemoglobin: 14.3 g/dL (ref 13.0–17.0)
Immature Granulocytes: 1 %
Lymphocytes Relative: 23 %
Lymphs Abs: 5 10*3/uL — ABNORMAL HIGH (ref 0.7–4.0)
MCH: 24.4 pg — ABNORMAL LOW (ref 26.0–34.0)
MCHC: 29.7 g/dL — ABNORMAL LOW (ref 30.0–36.0)
MCV: 82.2 fL (ref 80.0–100.0)
MONO ABS: 0.9 10*3/uL (ref 0.1–1.0)
MONOS PCT: 4 %
NEUTROS PCT: 72 %
NRBC: 0 % (ref 0.0–0.2)
Neutro Abs: 15.5 10*3/uL — ABNORMAL HIGH (ref 1.7–7.7)
PLATELETS: 291 10*3/uL (ref 150–400)
RBC: 5.85 MIL/uL — ABNORMAL HIGH (ref 4.22–5.81)
RDW: 17.8 % — AB (ref 11.5–15.5)
WBC: 21.7 10*3/uL — AB (ref 4.0–10.5)

## 2018-09-24 LAB — URINE DRUG SCREEN, QUALITATIVE (ARMC ONLY)
Amphetamines, Ur Screen: NOT DETECTED
Barbiturates, Ur Screen: NOT DETECTED
Benzodiazepine, Ur Scrn: POSITIVE — AB
CANNABINOID 50 NG, UR ~~LOC~~: NOT DETECTED
Cocaine Metabolite,Ur ~~LOC~~: NOT DETECTED
MDMA (ECSTASY) UR SCREEN: NOT DETECTED
Methadone Scn, Ur: NOT DETECTED
OPIATE, UR SCREEN: POSITIVE — AB
PHENCYCLIDINE (PCP) UR S: NOT DETECTED
Tricyclic, Ur Screen: NOT DETECTED

## 2018-09-24 LAB — BASIC METABOLIC PANEL
ANION GAP: 8 (ref 5–15)
BUN: 19 mg/dL (ref 6–20)
CO2: 29 mmol/L (ref 22–32)
CREATININE: 1.08 mg/dL (ref 0.61–1.24)
Calcium: 9.5 mg/dL (ref 8.9–10.3)
Chloride: 101 mmol/L (ref 98–111)
GFR calc Af Amer: >60 mL/min (ref >60)
GLUCOSE: 188 mg/dL — AB (ref 70–99)
Potassium: 4.6 mmol/L (ref 3.5–5.1)
Sodium: 138 mmol/L (ref 135–145)

## 2018-09-24 LAB — ACETAMINOPHEN LEVEL: Acetaminophen (Tylenol), Serum: <10 ug/mL — ABNORMAL LOW (ref 10–30)

## 2018-09-24 LAB — HEPATIC FUNCTION PANEL
ALT: 41 U/L (ref 0–44)
AST: 47 U/L — ABNORMAL HIGH (ref 15–41)
Albumin: 4 g/dL (ref 3.5–5.0)
Alkaline Phosphatase: 109 U/L (ref 38–126)
BILIRUBIN TOTAL: 0.6 mg/dL (ref 0.3–1.2)
Total Protein: 7.4 g/dL (ref 6.5–8.1)

## 2018-09-24 LAB — ETHANOL

## 2018-09-24 LAB — SALICYLATE LEVEL: Salicylate Lvl: <7 mg/dL (ref 2.8–30.0)

## 2018-09-24 MED ORDER — ONDANSETRON HCL 4 MG/2ML IJ SOLN
4.0000 mg | Freq: Once | INTRAMUSCULAR | Status: AC
  Administered 2018-09-24: 4 mg via INTRAVENOUS
  Filled 2018-09-24: qty 2

## 2018-09-24 MED ORDER — SODIUM CHLORIDE 0.9 % IV SOLN
500.0000 mg | INTRAVENOUS | Status: DC
  Administered 2018-09-24: 500 mg via INTRAVENOUS
  Filled 2018-09-24: qty 500

## 2018-09-24 MED ORDER — SODIUM CHLORIDE 0.9 % IV SOLN
1.0000 g | INTRAVENOUS | Status: DC
  Administered 2018-09-24: 1 g via INTRAVENOUS
  Filled 2018-09-24: qty 10

## 2018-09-24 MED ORDER — SODIUM CHLORIDE 0.9 % IV BOLUS
1000.0000 mL | Freq: Once | INTRAVENOUS | Status: AC
  Administered 2018-09-24: 1000 mL via INTRAVENOUS

## 2018-09-24 NOTE — ED Triage Notes (Signed)
Pt presents to ed via [facility] with heroin overdose. Pt has hx of heroin use. Wife found spoon with white powder substance on spoon. 1 mg narcan nasally via [facility]. Sinus tach on monitoe. 92% RA. Pt lethargic, but arousable. Pt reponds to voice. NAD at this time. Pt reported to have vomited the whole ems ride to hospital.

## 2018-09-24 NOTE — BH Assessment (Signed)
Assessment Note  Albert Sanders is an 52 y.o. male. Albert Sanders was unable to engage with the TTS assessor.  He was unable to provide his birth date or information.  His speech was  Slurred and he did not appear able to follow the conversation with the TTS assessor.   Diagnosis:   Past Medical History:  Past Medical History:  Diagnosis Date  . Arthritis   . Clavicular fracture   . CLL (chronic lymphocytic leukemia) ([place])   . Complication of anesthesia    woke up during gallbladder surgery-pt states it takes alot of anesthesia to sedate him   . Erectile dysfunction   . GERD (gastroesophageal reflux disease)   . History of kidney stones   . Morbid obesity ([place])   . Urinary calculus     Past Surgical History:  Procedure Laterality Date  . ANTERIOR CRUCIATE LIGAMENT REPAIR    . BICEPS TENDON REPAIR Right   . BONE MARROW BIOPSY  08-2015  . CHOLECYSTECTOMY    . CYSTOSCOPY W/ RETROGRADES Bilateral 09/16/2015   Procedure: CYSTOSCOPY WITH RETROGRADE PYELOGRAM;  Surgeon: Nickie Retort, MD;  Location: [facility] ORS;  Service: Urology;  Laterality: Bilateral;  . HARDWARE REMOVAL Left 01/08/2018   Procedure: HARDWARE REMOVAL- DEEP HARDWARE;  Surgeon: Lovell Sheehan, MD;  Location: [facility] ORS;  Service: Orthopedics;  Laterality: Left;    Family History:  Family History  Problem Relation Age of Onset  . Diabetes Paternal Uncle   . Cancer Paternal Grandmother        breast  . Diabetes Paternal Grandfather   . CAD Maternal Grandmother   . Heart disease Neg Hx   . Hypertension Neg Hx   . Stroke Neg Hx   . COPD Neg Hx     Social History:  reports that he has never smoked. His smokeless tobacco use includes snuff. He reports that he drinks alcohol. He reports that he has current or past drug history. Drug: IV.  Additional Social History:     CIWA:   COWS:    Allergies:  Allergies  Allergen Reactions  . Contrast Media [Iodinated Diagnostic Agents] Anaphylaxis    Home  Medications:  (Not in a hospital admission)  OB/GYN Status:  No LMP for male patient.  General Assessment Data Assessment unable to be completed: Yes Reason for not completing assessment: Patient is not able to participate in the assessment.                                                 Advance Directives (For Healthcare) Does Patient Have a Medical Advance Directive?: No Would patient like information on creating a medical advance directive?: No - Patient declined          Disposition:     On Site Evaluation by:   Reviewed with Physician:    Elmer Bales 09/24/2018 9:17 PM

## 2018-09-24 NOTE — ED Notes (Signed)
Pt denies SI/HI. Pt states "he just did a little too much".

## 2018-09-24 NOTE — ED Provider Notes (Signed)
[facility] Emergency Department Provider Note ____________________________________________   First MD Initiated Contact with Patient 09/24/18 1841     (approximate)  I have reviewed the triage vital signs and the nursing notes.   HISTORY  Chief Complaint Drug Overdose  Level 5 caveat: History of present illness limited due to altered mental status/intoxication  HPI Albert Sanders is a 52 y.o. male with PMH as noted below including heroin abuse presents after apparent heroin overdose.  The patient was found lethargic with a spoon containing a white substance.  He received Narcan by EMS and became more arousable with some vomiting while en route.  Past Medical History:  Diagnosis Date  . Arthritis   . Clavicular fracture   . CLL (chronic lymphocytic leukemia) ([place])   . Complication of anesthesia    woke up during gallbladder surgery-pt states it takes alot of anesthesia to sedate him   . Erectile dysfunction   . GERD (gastroesophageal reflux disease)   . History of kidney stones   . Morbid obesity ([place])   . Urinary calculus     Patient Active Problem List   Diagnosis Date Noted  . Prediabetes 11/08/2017  . Pre-op examination 11/07/2017  . Encounter for pain management counseling 11/07/2017  . Osteoarthritis of knee 10/03/2017  . Strain of knee 10/03/2017  . Chronic kidney disease, 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  . Hyperglycemia 10/07/2016  . Prostate cancer screening 10/07/2016  . Fatigue 10/07/2016  . Snoring 10/07/2016  . Erectile dysfunction 10/07/2016  . Vitamin D deficiency 10/06/2015  . Tenosynovitis 09/27/2015  . GERD (gastroesophageal reflux disease) 09/27/2015  . Low back pain 09/26/2015  . Obesity 09/26/2015  . Abnormal urine 09/21/2015  . Anxiety about health 09/21/2015  . Testosterone deficiency 09/18/2015  . Urinary calculus   . Clavicular fracture   . Flank pain 08/28/2015  . Lymphocytosis 08/20/2015  . Chronic lymphocytic  leukemia ([place]) 08/20/2015  . Adenopathy 07/31/2015  . Night sweats 07/31/2015  . Splenomegaly 07/31/2015    Past Surgical History:  Procedure Laterality Date  . ANTERIOR CRUCIATE LIGAMENT REPAIR    . BICEPS TENDON REPAIR Right   . BONE MARROW BIOPSY  08-2015  . CHOLECYSTECTOMY    . CYSTOSCOPY W/ RETROGRADES Bilateral 09/16/2015   Procedure: CYSTOSCOPY WITH RETROGRADE PYELOGRAM;  Surgeon: Nickie Retort, MD;  Location: [facility] ORS;  Service: Urology;  Laterality: Bilateral;  . HARDWARE REMOVAL Left 01/08/2018   Procedure: HARDWARE REMOVAL- DEEP HARDWARE;  Surgeon: Lovell Sheehan, MD;  Location: [facility] ORS;  Service: Orthopedics;  Laterality: Left;    Prior to Admission medications   Medication Sig Start Date End Date Taking? Authorizing Provider  ibuprofen (ADVIL,MOTRIN) 200 MG tablet Take 800 mg by mouth 3 (three) times daily as needed for headache or moderate pain.    [provider]  omeprazole (PRILOSEC OTC) 20 MG tablet Take 1 tablet (20 mg total) by mouth every morning. Patient not taking: Reported on 09/24/2018 10/06/16   Arnetha Courser, MD  oxymetazoline (AFRIN) 0.05 % nasal spray Place 1 spray into both nostrils daily as needed for congestion.    [provider]    Allergies Contrast media [iodinated diagnostic agents]  Family History  Problem Relation Age of Onset  . Diabetes Paternal Uncle   . Cancer Paternal Grandmother        breast  . Diabetes Paternal Grandfather   . CAD Maternal Grandmother   . Heart disease Neg Hx   . Hypertension Neg  Hx   . Stroke Neg Hx   . COPD Neg Hx     Social History Social History   Tobacco Use  . Smoking status: Never Smoker  . Smokeless tobacco: Current User    Types: Snuff  Substance Use Topics  . Alcohol use: Yes    Comment: rare  . Drug use: Yes    Types: IV    Comment: heroin x few years     Review of Systems Level 5 caveat: Review of systems limited due to intoxication/altered mental  status    ____________________________________________   PHYSICAL EXAM:  VITAL SIGNS: ED Triage Vitals  Enc Vitals Group     BP --      Pulse --      Resp --      Temp --      Temp src --      SpO2 --      Weight 09/24/18 1842 289 lb 14.5 oz (131.5 kg)     Height 09/24/18 1842 '6\' 2"'  (1.88 m)     Head Circumference --      Peak Flow --      Pain Score 09/24/18 1841 0     Pain Loc --      Pain Edu? --      Excl. in [place]? --     Constitutional: Sleepy but arousable.  Breathing spontaneously with good respiratory effort. Eyes: Conjunctivae are normal.  EOMI.  PERRLA. Head: Atraumatic. Nose: No congestion/rhinnorhea. Mouth/Throat: Mucous membranes are somewhat dry.   Neck: Normal range of motion.  Cardiovascular: Tachycardic, regular rhythm. Grossly normal heart sounds.  Good peripheral circulation. Respiratory: Normal respiratory effort.  No retractions. Lungs CTAB. Gastrointestinal: Soft and nontender. No distention.  Genitourinary: No flank tenderness. Musculoskeletal: Extremities warm and well perfused.  Neurologic: Following commands.  Motor intact in all extremities. Skin:  Skin is warm and dry. No rash noted. Psychiatric: Unable to assess. ____________________________________________   LABS (all labs ordered are listed, but only abnormal results are displayed)  Labs Reviewed  BASIC METABOLIC PANEL - Abnormal; Notable for the following components:      Result Value   Glucose, Bld 188 (*)    All other components within normal limits  HEPATIC FUNCTION PANEL - Abnormal; Notable for the following components:   AST 47 (*)    All other components within normal limits  CBC WITH DIFFERENTIAL/PLATELET - Abnormal; Notable for the following components:   WBC 21.7 (*)    RBC 5.85 (*)    MCH 24.4 (*)    MCHC 29.7 (*)    RDW 17.8 (*)    Neutro Abs 15.5 (*)    Lymphs Abs 5.0 (*)    Abs Immature Granulocytes 0.23 (*)    All other components within normal limits   ACETAMINOPHEN LEVEL - Abnormal; Notable for the following components:   Acetaminophen (Tylenol), Serum <10 (*)    All other components within normal limits  CULTURE, BLOOD (ROUTINE X 2)  CULTURE, BLOOD (ROUTINE X 2)  ETHANOL  SALICYLATE LEVEL  LACTIC ACID, PLASMA  URINE DRUG SCREEN, QUALITATIVE ([facility] ONLY)  LACTIC ACID, PLASMA   ____________________________________________  EKG  ED ECG REPORT I, Arta Silence, the attending physician, personally viewed and interpreted this ECG.  Date: 09/24/2018 EKG Time: 1914 Rate: 123 Rhythm: Sinus tachycardia QRS Axis: normal Intervals: normal ST/T Wave abnormalities: Early repolarization Narrative Interpretation: no evidence of acute ischemia  ____________________________________________  RADIOLOGY  CXR: Bibasilar infiltrates, possible pneumonia  ____________________________________________  PROCEDURES  Procedure(s) performed: No  Procedures  Critical Care performed: Yes  CRITICAL CARE Performed by: Arta Silence   Total critical care time: 30 minutes  Critical care time was exclusive of separately billable procedures and treating other patients.  Critical care was necessary to treat or prevent imminent or life-threatening deterioration.  Critical care was time spent personally by me on the following activities: development of treatment plan with patient and/or surrogate as well as nursing, discussions with consultants, evaluation of patient's response to treatment, examination of patient, obtaining history from patient or surrogate, ordering and performing treatments and interventions, ordering and review of laboratory studies, ordering and review of radiographic studies, pulse oximetry and re-evaluation of patient's condition. ____________________________________________   INITIAL IMPRESSION / ASSESSMENT AND PLAN / ED COURSE  Pertinent labs & imaging results that were available during my care of the  patient were reviewed by me and considered in my medical decision making (see chart for details).  52 year old male with history of heroin abuse and other PMH as noted above presents with concern for apparent heroin overdose.  The patient was found unresponsive given Narcan by EMS.  He is now more arousable.  He denies SI/HI to EMS.  I reviewed the past medical records in epic; the patient was most recently seen in August of this year with heroin overdose and left the hospital AMA when he returned to sobriety.  On exam currently he is tachycardic but otherwise has normal vital signs.  He is somewhat sleepy but arousable to voice and following commands.  He is breathing spontaneously and is actually somewhat tachypneic, but maintaining good O2 saturation and end-tidal on room air.  The remainder of the exam is as described above.  Presentation is consistent with heroin overdose.  We will obtain medical work-up, continue to observe and give additional Narcan as indicated.  Per EMS the patient's family may be taking out IVC paperwork on him although at this time the patient does not demonstrate acute danger to self or others.   ----------------------------------------- 9:17 PM on 09/24/2018 -----------------------------------------  The patient's wife is now here.  On further discussion with her, it appears that the patient's drug abuse is escalating due to some life stressors including his mother being ill.  She is concerned that he did not accept treatment after the overdose in August, and now after second life-threatening overdose, she is concerned that he is an acute danger to himself.  The patient is still too somnolent to participate with detailed history.  I think that it is reasonable to place the patient under involuntary commitment, pending psychiatric evaluation when he is sober.  However, the patient was found to be febrile and his lab work-up reveals elevated WBC count and x-ray findings  suggestive of pneumonia.  I have started antibiotics for CAP.  He may require admission for pneumonia.  ----------------------------------------- 11:05 PM on 09/24/2018 -----------------------------------------  The patient is now more alert.  He remains febrile and tachycardic.  I still think he will need admission for IV fluids and antibiotics and monitoring.  Patient agrees with this plan.  I signed the patient out to the hospitalist Dr. Duane Boston.  ____________________________________________   FINAL CLINICAL IMPRESSION(S) / ED DIAGNOSES  Final diagnoses:  Diacetylmorphine overdose, undetermined intent, initial encounter ([place])  Community acquired pneumonia, unspecified laterality      NEW MEDICATIONS STARTED DURING THIS VISIT:  New Prescriptions   No medications on file     Note:  This document was prepared using  [employer] education [employer] and may include unintentional dictation errors.    Arta Silence, MD 09/24/18 2306

## 2018-09-25 ENCOUNTER — Inpatient Hospital Stay: Payer: Self-pay

## 2018-09-25 ENCOUNTER — Other Ambulatory Visit: Payer: Self-pay

## 2018-09-25 DIAGNOSIS — F1910 Other psychoactive substance abuse, uncomplicated: Secondary | ICD-10-CM

## 2018-09-25 DIAGNOSIS — F1110 Opioid abuse, uncomplicated: Secondary | ICD-10-CM

## 2018-09-25 DIAGNOSIS — T40601A Poisoning by unspecified narcotics, accidental (unintentional), initial encounter: Secondary | ICD-10-CM

## 2018-09-25 LAB — CBC
HEMATOCRIT: 43.4 % (ref 39.0–52.0)
Hemoglobin: 13 g/dL (ref 13.0–17.0)
MCH: 24.3 pg — ABNORMAL LOW (ref 26.0–34.0)
MCHC: 30 g/dL (ref 30.0–36.0)
MCV: 81.3 fL (ref 80.0–100.0)
NRBC: 0 % (ref 0.0–0.2)
Platelets: 262 10*3/uL (ref 150–400)
RBC: 5.34 MIL/uL (ref 4.22–5.81)
RDW: 17.1 % — AB (ref 11.5–15.5)
WBC: 19.3 10*3/uL — ABNORMAL HIGH (ref 4.0–10.5)

## 2018-09-25 MED ORDER — HEPARIN SODIUM (PORCINE) 5000 UNIT/ML IJ SOLN
5000.0000 [IU] | Freq: Three times a day (TID) | INTRAMUSCULAR | Status: DC
  Administered 2018-09-25: 5000 [IU] via SUBCUTANEOUS
  Filled 2018-09-25: qty 1

## 2018-09-25 MED ORDER — HYDROCODONE-ACETAMINOPHEN 5-325 MG PO TABS: 1.0000 | ORAL_TABLET | ORAL | Status: DC | PRN

## 2018-09-25 MED ORDER — PIPERACILLIN-TAZOBACTAM 3.375 G IVPB
3.3750 g | Freq: Three times a day (TID) | INTRAVENOUS | Status: DC
  Administered 2018-09-25 – 2018-09-26 (×4): 3.375 g via INTRAVENOUS
  Filled 2018-09-25 (×4): qty 50

## 2018-09-25 MED ORDER — ACETAMINOPHEN 650 MG RE SUPP: 650.0000 mg | Freq: Four times a day (QID) | RECTAL | Status: DC | PRN

## 2018-09-25 MED ORDER — IPRATROPIUM-ALBUTEROL 0.5-2.5 (3) MG/3ML IN SOLN: 3.0000 mL | RESPIRATORY_TRACT | Status: DC | PRN

## 2018-09-25 MED ORDER — ONDANSETRON HCL 4 MG/2ML IJ SOLN
4.0000 mg | Freq: Four times a day (QID) | INTRAMUSCULAR | Status: DC | PRN
  Administered 2018-09-25: 22:00:00 4 mg via INTRAVENOUS
  Filled 2018-09-25: qty 2

## 2018-09-25 MED ORDER — HYDROCODONE-ACETAMINOPHEN 5-325 MG PO TABS
1.0000 | ORAL_TABLET | ORAL | Status: DC | PRN
  Administered 2018-09-25: 1 via ORAL
  Administered 2018-09-25 – 2018-09-26 (×3): 2 via ORAL
  Filled 2018-09-25: qty 1
  Filled 2018-09-25 (×3): qty 2

## 2018-09-25 MED ORDER — DOCUSATE SODIUM 100 MG PO CAPS
100.0000 mg | ORAL_CAPSULE | Freq: Two times a day (BID) | ORAL | Status: DC
  Administered 2018-09-25 (×2): 100 mg via ORAL
  Filled 2018-09-25 (×3): qty 1

## 2018-09-25 MED ORDER — ACETAMINOPHEN 325 MG PO TABS: 650.0000 mg | ORAL_TABLET | Freq: Four times a day (QID) | ORAL | Status: DC | PRN

## 2018-09-25 MED ORDER — LORAZEPAM 2 MG/ML IJ SOLN
1.0000 mg | Freq: Once | INTRAMUSCULAR | Status: AC
  Administered 2018-09-25: 1 mg via INTRAVENOUS

## 2018-09-25 MED ORDER — ONDANSETRON HCL 4 MG PO TABS: 4.0000 mg | ORAL_TABLET | Freq: Four times a day (QID) | ORAL | Status: DC | PRN

## 2018-09-25 MED ORDER — SODIUM CHLORIDE 0.9 % IV SOLN
INTRAVENOUS | Status: DC
  Administered 2018-09-25: 07:00:00 via INTRAVENOUS

## 2018-09-25 MED ORDER — ENOXAPARIN SODIUM 40 MG/0.4ML ~~LOC~~ SOLN: 40.0000 mg | SUBCUTANEOUS | Status: DC

## 2018-09-25 MED ORDER — METRONIDAZOLE IN NACL 5-0.79 MG/ML-% IV SOLN
INTRAVENOUS | Status: AC
  Filled 2018-09-25: qty 100

## 2018-09-25 MED ORDER — BISACODYL 5 MG PO TBEC: 5.0000 mg | DELAYED_RELEASE_TABLET | Freq: Every day | ORAL | Status: DC | PRN

## 2018-09-25 MED ORDER — LORAZEPAM 2 MG/ML IJ SOLN
INTRAMUSCULAR | Status: AC
  Filled 2018-09-25: qty 1

## 2018-09-25 NOTE — Progress Notes (Addendum)
Repeatedly receiving calls from central telemetry that pt is off of telemetry.  Each time pt has removed the leads from his chest.  Educated pt on need for telemetry but he appears to not understand.  Changed out battery and changed leads but continue to find leads removed from pads on chest.  Telemetry placed on hold and call out to hospitalist for new orders. Pt also requesting something to sleep.  Awaiting response from hospitalist. Dorna Bloom RN Received order to dc tele and give Ativian 1mg  IV once. Dorna Bloom RN

## 2018-09-25 NOTE — ED Notes (Signed)
Pt wanted to know how long IVC would last. This tech explained we would know more after pt speaks with Dr. Weber Cooks. Explained to pt that he would be admitted overnight due to pneumonia. Pt just kept asking when could he get out of here and how long would the IVC last. Pt concerned about where his phone is. Pt stated he knew who placed IVC and started and cussing. This tech informed pt that I do not know who placed the pt under IVC.

## 2018-09-25 NOTE — Consult Note (Signed)
[facility] Consult   Reason for Consult: Consult for this patient with heroin abuse who came into the hospital after a near fatal heroin overdose Referring Physician: Rip Harbour Patient Identification: Albert Sanders MRN:  [id number] Principal Diagnosis: Opiate overdose [facility]) Diagnosis:   Patient Active Problem List   Diagnosis Date Noted  . Opiate overdose ([place]) [T40.601A] 09/25/2018  . Intravenous drug abuse ([place]) [F19.10] 09/25/2018  . Opiate abuse, continuous ([place]) [F11.10] 09/25/2018  . CAP (community acquired pneumonia) [J18.9] 09/24/2018  . Prediabetes [R73.03] 11/08/2017  . Pre-op examination [E56.314] 11/07/2017  . Encounter for pain management counseling [Z71.89] 11/07/2017  . Osteoarthritis of knee [M17.10] 10/03/2017  . Strain of knee [S86.919A] 10/03/2017  . 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 Hyperglycemia [R73.9] 10/07/2016  . Prostate cancer screening [Z12.5] 10/07/2016  . Fatigue [R53.83] 10/07/2016  . Snoring [R06.83] 10/07/2016  . Erectile dysfunction [N52.9] 10/07/2016  . Vitamin D deficiency [E55.9] 10/06/2015  . Tenosynovitis [M65.9] 09/27/2015  . GERD (gastroesophageal reflux disease) [K21.9] 09/27/2015  . Low back pain [M54.5] 09/26/2015  . Obesity [E66.9] 09/26/2015  . Abnormal urine [R82.90] 09/21/2015  . Anxiety about health [F41.8] 09/21/2015  . Testosterone deficiency [E34.9] 09/18/2015  . Urinary calculus [N20.9]   . Clavicular fracture [S42.009A]   . Flank pain [R10.9] 08/28/2015  . Lymphocytosis [D72.820] 08/20/2015  . Chronic lymphocytic leukemia ([place]) [C91.10] 08/20/2015  . Adenopathy [R59.1] 07/31/2015  . Night sweats [R61] 07/31/2015  . Splenomegaly [R16.1] 07/31/2015    Total Time spent with patient: 1 hour  Subjective:   Albert Sanders is a 52 y.o. male patient admitted with "heroin".  HPI: Patient seen chart reviewed.  Patient was brought in by EMS after they were called to the scene by  his wife.  Wife described as seeing of finding white powder in a spoon with intravenous works in the patient unresponsive.  He was revived with Narcan and brought to the hospital.  On interview now the patient does not remember any of this.  Last thing he remembers was being at home.  He admits however that he abuses heroin intravenously and shoots up 2 or 3 times a day.  Denies that he is using any other drugs.  Patient understands the dangerousness of this and says he would like to stop but has struggled with it.  He says his mood is a little bit down chronically and situationally but not consistently depressed.  Denies feeling hopeless.  He absolutely denies any suicidal thought intent or plan and denies that any part of the overdose was intending to harm himself.  Patient says he is currently going to [place] and trying to get treatment through the substance abuse program there.  Social history: Lives with his wife has 2 children at home as well.  He is on workman's comp for a knee injury.  No insurance.  Past medical history: Has a knee injury that he is on Workmen's Comp. for.  Some other chronic and intermittent medical problems nothing more acute currently.  Past history of chronic lymphocytic leukemia.  Substance abuse history: Patient says he started using opiates when he first had a injury to his knee and was prescribed narcotic pain pills.  Escalated to where he started using intravenous drugs.  He has had treatment in the past and said he has been able to stay sober with the use of Suboxone previously.  He has had 2 overdoses requiring hospital visits fairly recently.  Past Psychiatric History:  No previous psychiatric hospitalizations no history of suicide attempts.  He has gone to substance abuse treatment in the past and is currently going to [place].  Risk to Self:   Risk to Others:   Prior Inpatient Therapy:   Prior Outpatient Therapy:    Past Medical History:  Past Medical History:   Diagnosis Date  . Arthritis   . Clavicular fracture   . CLL (chronic lymphocytic leukemia) ([place])   . Complication of anesthesia    woke up during gallbladder surgery-pt states it takes alot of anesthesia to sedate him   . Erectile dysfunction   . GERD (gastroesophageal reflux disease)   . History of kidney stones   . Morbid obesity ([place])   . Urinary calculus     Past Surgical History:  Procedure Laterality Date  . ANTERIOR CRUCIATE LIGAMENT REPAIR    . BICEPS TENDON REPAIR Right   . BONE MARROW BIOPSY  08-2015  . CHOLECYSTECTOMY    . CYSTOSCOPY W/ RETROGRADES Bilateral 09/16/2015   Procedure: CYSTOSCOPY WITH RETROGRADE PYELOGRAM;  Surgeon: Nickie Retort, MD;  Location: [facility] ORS;  Service: Urology;  Laterality: Bilateral;  . HARDWARE REMOVAL Left 01/08/2018   Procedure: HARDWARE REMOVAL- DEEP HARDWARE;  Surgeon: Lovell Sheehan, MD;  Location: [facility] ORS;  Service: Orthopedics;  Laterality: Left;   Family History:  Family History  Problem Relation Age of Onset  . Diabetes Paternal Uncle   . Cancer Paternal Grandmother        breast  . Diabetes Paternal Grandfather   . CAD Maternal Grandmother   . Heart disease Neg Hx   . Hypertension Neg Hx   . Stroke Neg Hx   . COPD Neg Hx    Family Psychiatric  History: No family history Social History:  Social History   Substance and Sexual Activity  Alcohol Use Yes   Comment: rare     Social History   Substance and Sexual Activity  Drug Use Yes  . Types: IV   Comment: heroin x few years     Social History   Socioeconomic History  . Marital status: Married    Spouse name: Not on file  . Number of children: Not on file  . Years of education: Not on file  . Highest education level: Not on file  Occupational History  . Not on file  Social Needs  . Financial resource strain: Not on file  . Food insecurity:    Worry: Not on file    Inability: Not on file  . Transportation needs:    Medical: Not on file     Non-medical: Not on file  Tobacco Use  . Smoking status: Never Smoker  . Smokeless tobacco: Current User    Types: Snuff  Substance and Sexual Activity  . Alcohol use: Yes    Comment: rare  . Drug use: Yes    Types: IV    Comment: heroin x few years   . Sexual activity: Yes    Birth control/protection: None  Lifestyle  . Physical activity:    Days per week: Not on file    Minutes per session: Not on file  . Stress: Not on file  Relationships  . Social connections:    Talks on phone: Not on file    Gets together: Not on file    Attends religious service: Not on file    Active member of club or organization: Not on file    Attends meetings of clubs or organizations:  Not on file    Relationship status: Not on file  Other Topics Concern  . Not on file  Social History Narrative  . Not on file   Additional Social History:    Allergies:   Allergies  Allergen Reactions  . Contrast Media [Iodinated Diagnostic Agents] Anaphylaxis    Labs:  Results for orders placed or performed during the hospital encounter of 09/24/18 (from the past 48 hour(s))  Basic metabolic panel     Status: Abnormal   Collection Time: 09/24/18  7:10 PM  Result Value Ref Range   Sodium 138 135 - 145 mmol/L   Potassium 4.6 3.5 - 5.1 mmol/L   Chloride 101 98 - 111 mmol/L   CO2 29 22 - 32 mmol/L   Glucose, Bld 188 (H) 70 - 99 mg/dL   BUN 19 6 - 20 mg/dL   Creatinine, Ser 1.08 0.61 - 1.24 mg/dL   Calcium 9.5 8.9 - 10.3 mg/dL   GFR calc non Af Amer >60 >60 mL/min   GFR calc Af Amer >60 >60 mL/min    Comment: (NOTE) The eGFR has been calculated using the CKD EPI equation. This calculation has not been validated in all clinical situations. eGFR's persistently <60 mL/min signify possible Chronic Kidney Disease.    Anion gap 8 5 - 15    Comment: Performed at [facility], [address] [place], [place] [address]  Hepatic function panel     Status: Abnormal   Collection Time: 09/24/18   7:10 PM  Result Value Ref Range   Total Protein 7.4 6.5 - 8.1 g/dL   Albumin 4.0 3.5 - 5.0 g/dL   AST 47 (H) 15 - 41 U/L   ALT 41 0 - 44 U/L   Alkaline Phosphatase 109 38 - 126 U/L   Total Bilirubin 0.6 0.3 - 1.2 mg/dL   Bilirubin, Direct <0.1 0.0 - 0.2 mg/dL   Indirect Bilirubin NOT CALCULATED 0.3 - 0.9 mg/dL    Comment: Performed at [facility], [address] [place], [place] [address]  CBC with Differential     Status: Abnormal   Collection Time: 09/24/18  7:10 PM  Result Value Ref Range   WBC 21.7 (H) 4.0 - 10.5 K/uL   RBC 5.85 (H) 4.22 - 5.81 MIL/uL   Hemoglobin 14.3 13.0 - 17.0 g/dL   HCT 48.1 39.0 - 52.0 %   MCV 82.2 80.0 - 100.0 fL   MCH 24.4 (L) 26.0 - 34.0 pg   MCHC 29.7 (L) 30.0 - 36.0 g/dL   RDW 17.8 (H) 11.5 - 15.5 %   Platelets 291 150 - 400 K/uL   nRBC 0.0 0.0 - 0.2 %   Neutrophils Relative % 72 %   Neutro Abs 15.5 (H) 1.7 - 7.7 K/uL   Lymphocytes Relative 23 %   Lymphs Abs 5.0 (H) 0.7 - 4.0 K/uL   Monocytes Relative 4 %   Monocytes Absolute 0.9 0.1 - 1.0 K/uL   Eosinophils Relative 0 %   Eosinophils Absolute 0.0 0.0 - 0.5 K/uL   Basophils Relative 0 %   Basophils Absolute 0.1 0.0 - 0.1 K/uL   Immature Granulocytes 1 %   Abs Immature Granulocytes 0.23 (H) 0.00 - 0.07 K/uL    Comment: Performed at [facility], [address] [place], [place] [address]  Ethanol     Status: None   Collection Time: 09/24/18  7:10 PM  Result Value Ref Range   Alcohol, Ethyl (B) <10 <10  mg/dL    Comment: (NOTE) Lowest detectable limit for serum alcohol is 10 mg/dL. For medical purposes only. Performed at [facility], [address] [place], [place] [address]   Acetaminophen level     Status: Abnormal   Collection Time: 09/24/18  7:10 PM  Result Value Ref Range   Acetaminophen (Tylenol), Serum <10 (L) 10 - 30 ug/mL    Comment: (NOTE) Therapeutic concentrations vary significantly. A range of 10-30 ug/mL  may be an effective concentration  for many patients. However, some  are best treated at concentrations outside of this range. Acetaminophen concentrations >150 ug/mL at 4 hours after ingestion  and >50 ug/mL at 12 hours after ingestion are often associated with  toxic reactions. Performed at [facility], [address] [place], [place] [address]   Salicylate level     Status: None   Collection Time: 09/24/18  7:10 PM  Result Value Ref Range   Salicylate Lvl <9.7 2.8 - 30.0 mg/dL    Comment: Performed at [facility], [address] [place], [place] [address]  Lactic acid, plasma     Status: None   Collection Time: 09/24/18 10:09 PM  Result Value Ref Range   Lactic Acid, Venous 1.5 0.5 - 1.9 mmol/L    Comment: Performed at [facility], [address] [place], [place] [address]  Culture, blood (routine x 2)     Status: None (Preliminary result)   Collection Time: 09/24/18 10:09 PM  Result Value Ref Range   Specimen Description BLOOD BLOOD RIGHT ARM    Special Requests      BOTTLES DRAWN AEROBIC AND ANAEROBIC Blood Culture adequate volume   Culture      NO GROWTH < 12 HOURS Performed at [facility], [address], [place], [place] [address]    Report Status PENDING   Culture, blood (routine x 2)     Status: None (Preliminary result)   Collection Time: 09/24/18 10:09 PM  Result Value Ref Range   Specimen Description BLOOD BLOOD LEFT WRIST    Special Requests      BOTTLES DRAWN AEROBIC AND ANAEROBIC Blood Culture adequate volume   Culture      NO GROWTH < 12 HOURS Performed at [facility], [address] [place], [place] [address]    Report Status PENDING   Urine Drug Screen, Qualitative     Status: Abnormal   Collection Time: 09/24/18 11:01 PM  Result Value Ref Range   Tricyclic, Ur Screen NONE DETECTED NONE DETECTED   Amphetamines, Ur Screen NONE DETECTED NONE DETECTED   MDMA (Ecstasy)Ur Screen NONE DETECTED NONE DETECTED   Cocaine Metabolite,Ur [place]  NONE DETECTED NONE DETECTED   Opiate, Ur Screen POSITIVE (A) NONE DETECTED   Phencyclidine (PCP) Ur S NONE DETECTED NONE DETECTED   Cannabinoid 50 Ng, Ur [place] NONE DETECTED NONE DETECTED   Barbiturates, Ur Screen NONE DETECTED NONE DETECTED   Benzodiazepine, Ur Scrn POSITIVE (A) NONE DETECTED   Methadone Scn, Ur NONE DETECTED NONE DETECTED    Comment: (NOTE) Tricyclics + metabolites, urine    Cutoff 1000 ng/mL Amphetamines + metabolites, urine  Cutoff 1000 ng/mL MDMA (Ecstasy), urine              Cutoff 500 ng/mL Cocaine Metabolite, urine          Cutoff 300 ng/mL Opiate + metabolites, urine        Cutoff 300 ng/mL Phencyclidine (PCP), urine  Cutoff 25 ng/mL Cannabinoid, urine                 Cutoff 50 ng/mL Barbiturates + metabolites, urine  Cutoff 200 ng/mL Benzodiazepine, urine              Cutoff 200 ng/mL Methadone, urine                   Cutoff 300 ng/mL The urine drug screen provides only a preliminary, unconfirmed analytical test result and should not be used for non-medical purposes. Clinical consideration and professional judgment should be applied to any positive drug screen result due to possible interfering substances. A more specific alternate chemical method must be used in order to obtain a confirmed analytical result. Gas chromatography / mass spectrometry (GC/MS) is the preferred confirmat ory method. Performed at [facility], [address], [place], [place] [address]     Current Facility-Administered Medications  Medication Dose Route Frequency Provider Last Rate Last Dose  . 0.9 %  sodium chloride infusion   Intravenous Continuous Amelia Jo, MD 75 mL/hr at 09/25/18 9357    . acetaminophen (TYLENOL) tablet 650 mg  650 mg Oral Q6H PRN Amelia Jo, MD       Or  . acetaminophen (TYLENOL) suppository 650 mg  650 mg Rectal Q6H PRN Amelia Jo, MD      . bisacodyl (DULCOLAX) EC tablet 5 mg  5 mg Oral Daily PRN Amelia Jo, MD      .  docusate sodium (COLACE) capsule 100 mg  100 mg Oral BID Amelia Jo, MD   100 mg at 09/25/18 0934  . heparin injection 5,000 Units  5,000 Units Subcutaneous Q8H Amelia Jo, MD   5,000 Units at 09/25/18 0734  . HYDROcodone-acetaminophen (NORCO/VICODIN) 5-325 MG per tablet 1-2 tablet  1-2 tablet Oral Q4H PRN Amelia Jo, MD      . ondansetron [facility]) tablet 4 mg  4 mg Oral Q6H PRN Amelia Jo, MD       Or  . ondansetron [facility]) injection 4 mg  4 mg Intravenous Q6H PRN Amelia Jo, MD      . piperacillin-tazobactam (ZOSYN) IVPB 3.375 g  3.375 g Intravenous Q8H Salary, Montell D, MD 12.5 mL/hr at 09/25/18 0915 3.375 g at 09/25/18 0915   Current Outpatient Medications  Medication Sig Dispense Refill  . ibuprofen (ADVIL,MOTRIN) 200 MG tablet Take 800 mg by mouth 3 (three) times daily as needed for headache or moderate pain.    Marland Kitchen omeprazole (PRILOSEC OTC) 20 MG tablet Take 1 tablet (20 mg total) by mouth every morning. (Patient not taking: Reported on 09/24/2018) 30 tablet 1  . oxymetazoline (AFRIN) 0.05 % nasal spray Place 1 spray into both nostrils daily as needed for congestion.      Musculoskeletal: Strength & Muscle Tone: within normal limits Gait & Station: normal Patient leans: N/A  Psychiatric Specialty Exam: Physical Exam  Nursing note and vitals reviewed. Constitutional: He appears well-developed and well-nourished.  HENT:  Head: Normocephalic and atraumatic.  Eyes: Pupils are equal, round, and reactive to light. Conjunctivae are normal.  Neck: Normal range of motion.  Cardiovascular: Regular rhythm and normal heart sounds.  Respiratory: Effort normal. No respiratory distress.  GI: Soft.  Musculoskeletal: Normal range of motion.  Neurological: He is alert.  Skin: Skin is warm and dry.  Psychiatric: Judgment normal. His affect is blunt. His speech is delayed. He is slowed. Thought content is not paranoid and not delusional. He expresses no  homicidal and no suicidal  ideation. He exhibits abnormal recent memory.    Review of Systems  Constitutional: Negative.   HENT: Negative.   Eyes: Negative.   Respiratory: Negative.   Cardiovascular: Negative.   Gastrointestinal: Negative.   Musculoskeletal: Positive for joint pain.  Skin: Negative.   Neurological: Negative.   Psychiatric/Behavioral: Positive for memory loss and substance abuse. Negative for depression, hallucinations and suicidal ideas. The patient is not nervous/anxious and does not have insomnia.     Blood pressure (!) 109/56, pulse 84, temperature (!) 102.4 F (39.1 C), temperature source Oral, resp. rate 18, height '6\' 2"'  (1.88 m), weight 131.5 kg, SpO2 95 %.Body mass index is 37.22 kg/m.  General Appearance: Casual  Eye Contact:  Good  Speech:  Slow  Volume:  Normal  Mood:  Euthymic  Affect:  Constricted  Thought Process:  Goal Directed  Orientation:  Full (Time, Place, and Person)  Thought Content:  Logical  Suicidal Thoughts:  No  Homicidal Thoughts:  No  Memory:  Immediate;   Fair Recent;   Poor Remote;   Fair  Judgement:  Fair  Insight:  Fair  Psychomotor Activity:  Decreased  Concentration:  Concentration: Fair  Recall:  [employer] of Knowledge:  Fair  Language:  Fair  Akathisia:  No  Handed:  Right  AIMS (if indicated):     Assets:  Desire for Improvement Housing Social Support  ADL's:  Intact  Cognition:  WNL  Sleep:        Treatment Plan Summary: Plan 52 year old man with heroin dependence came in after an accidental overdose.  No indication that this was a suicide attempt.  Patient's affect is euthymic and he does not report major depressive symptoms.  Does not describe feeling hopeless.  Patient does not meet commitment criteria.  Discontinue IVC.  Spent time doing counseling about opiate abuse.  Patient is already educated about a lot of this and is already going to [place].  Case reviewed with emergency room doctor.  The patient was scheduled to be admitted to  the hospital for a serendipitously discovered pneumonia.  At this point not sure if that is going to proceed or not.  Discontinue IVC no further psychiatric treatment.  I counseled him specifically about trying to make sure that he has no locks own inhalers at home and he says he already has several of them.  Disposition: No evidence of imminent risk to self or others at present.   Patient does not meet criteria for psychiatric inpatient admission. Supportive therapy provided about ongoing stressors. Discussed crisis plan, support from social network, calling 911, coming to the Emergency Department, and calling Suicide Hotline.  Alethia Berthold, MD 09/25/2018 12:50 PM

## 2018-09-25 NOTE — ED Notes (Signed)
Pt belongings given to pt and transported with him to [address]124

## 2018-09-25 NOTE — ED Notes (Signed)
Per Dr. Rip Harbour pt is no longer behavior and 15 minute check sheets are no longer needed.

## 2018-09-25 NOTE — ED Notes (Signed)
Pt given breakfast tray

## 2018-09-25 NOTE — Progress Notes (Signed)
[place] at [place] NAME: Albert Sanders    MR#:  [id number]  DATE OF BIRTH:  [birth year]  SUBJECTIVE:  CHIEF COMPLAINT:   Chief Complaint  Patient presents with  . Drug Overdose  Patient without complaint, psychiatry input appreciated-IVC was discontinued  REVIEW OF SYSTEMS:  CONSTITUTIONAL: No fever, fatigue or weakness.  EYES: No blurred or double vision.  EARS, NOSE, AND THROAT: No tinnitus or ear pain.  RESPIRATORY: No cough, shortness of breath, wheezing or hemoptysis.  CARDIOVASCULAR: No chest pain, orthopnea, edema.  GASTROINTESTINAL: No nausea, vomiting, diarrhea or abdominal pain.  GENITOURINARY: No dysuria, hematuria.  ENDOCRINE: No polyuria, nocturia,  HEMATOLOGY: No anemia, easy bruising or bleeding SKIN: No rash or lesion. MUSCULOSKELETAL: No joint pain or arthritis.   NEUROLOGIC: No tingling, numbness, weakness.  PSYCHIATRY: No anxiety or depression.   ROS  DRUG ALLERGIES:   Allergies  Allergen Reactions  . Contrast Media [Iodinated Diagnostic Agents] Anaphylaxis    VITALS:  Blood pressure (!) 150/80, pulse 86, temperature (!) 102.4 F (39.1 C), temperature source Oral, resp. rate 18, height 6\' 2"  (1.88 m), weight 131.5 kg, SpO2 93 %.  PHYSICAL EXAMINATION:  GENERAL:  52 y.o.-year-old patient lying in the bed with no acute distress.  EYES: Pupils equal, round, reactive to light and accommodation. No scleral icterus. Extraocular muscles intact.  HEENT: Head atraumatic, normocephalic. Oropharynx and nasopharynx clear.  NECK:  Supple, no jugular venous distention. No thyroid enlargement, no tenderness.  LUNGS: Normal breath sounds bilaterally, no wheezing, rales,rhonchi or crepitation. No use of accessory muscles of respiration.  CARDIOVASCULAR: S1, S2 normal. No murmurs, rubs, or gallops.  ABDOMEN: Soft, nontender, nondistended. Bowel sounds present. No organomegaly or mass.  EXTREMITIES: No pedal edema, cyanosis,  or clubbing.  NEUROLOGIC: Cranial nerves II through XII are intact. Muscle strength 5/5 in all extremities. Sensation intact. Gait not checked.  PSYCHIATRIC: The patient is alert and oriented x 3.  SKIN: No obvious rash, lesion, or ulcer.   Physical Exam LABORATORY PANEL:   CBC Recent Labs  Lab 09/24/18 1910  WBC 21.7*  HGB 14.3  HCT 48.1  PLT 291   ------------------------------------------------------------------------------------------------------------------  Chemistries  Recent Labs  Lab 09/24/18 1910  NA 138  K 4.6  CL 101  CO2 29  GLUCOSE 188*  BUN 19  CREATININE 1.08  CALCIUM 9.5  AST 47*  ALT 41  ALKPHOS 109  BILITOT 0.6   ------------------------------------------------------------------------------------------------------------------  Cardiac Enzymes No results for input(s): TROPONINI in the last 168 hours. ------------------------------------------------------------------------------------------------------------------  RADIOLOGY:  Dg Chest 2 View  Result Date: 09/25/2018 CLINICAL DATA:  Altered mental status. Heroin abuse. Possible pneumonia. EXAM: CHEST - 2 VIEW COMPARISON:  09/24/2018. FINDINGS: Cardiomegaly with mild pulmonary venous congestion. Bilateral pulmonary interstitial prominence. Changes could be related to the pulmonary interstitial edema and/or pneumonitis. Mild bibasilar atelectasis. Improved aeration both lung bases from prior exam. No pleural effusion. No pneumothorax. Old healed left clavicular fracture. Surgical clips over right shoulder. IMPRESSION: 1. Cardiomegaly with mild pulmonary venous congestion. Mild bilateral from interstitial prominence. Changes could be related to mild interstitial edema. Bilateral pneumonitis cannot be excluded. 2. Bibasilar atelectasis. Improved aeration in both lungs from prior exam. Electronically Signed   By: Marcello Moores  Register   On: 09/25/2018 13:03   Dg Chest Portable 1 View  Result Date:  09/24/2018 CLINICAL DATA:  Tachypnea EXAM: PORTABLE CHEST 1 VIEW COMPARISON:  07/22/2018 FINDINGS: Bibasilar airspace disease is new and may represent pneumonia. No effusion. Heart  size normal. IMPRESSION: Bibasilar airspace disease suspicious for pneumonia. Electronically Signed   By: Franchot Gallo M.D.   On: 09/24/2018 19:49    ASSESSMENT AND PLAN:  *Acute heroin overdose, unintentional somewhat improving after Narcan Psychiatry did see patient while in house-IVC was discontinued  *Acute probable aspiration pneumonia Secondary to above, noted immunocompromise state from CLL Pneumonia protocol, empiric Zosyn for now, follow-up on cultures  *CLL Stable We will need to follow-up with hematology status post discharge for continued care/medical management   All the records are reviewed and case discussed with Care Management/Social Workerr. Management plans discussed with the patient, family and they are in agreement.  CODE STATUS: full  TOTAL TIME TAKING CARE OF THIS PATIENT: 40 minutes.     POSSIBLE D/C IN 1-2 DAYS, DEPENDING ON CLINICAL CONDITION.   Albert Sanders M.D on 09/25/2018   Between 7am to 6pm - Pager - [phone]  After 6pm go to [email or web address] - password [email or web address] [facility]  Office  [phone]  CC: Primary care physician; Guadalupe Maple, MD  Note: This dictation was prepared with Dragon dictation along with smaller phrase technology. Any transcriptional errors that result from this process are unintentional.

## 2018-09-25 NOTE — ED Notes (Signed)
Pt unhooked from monitor and ambulated to toilet.

## 2018-09-25 NOTE — ED Notes (Signed)
Pt was asked if he would allow his wife to have the permission to call and gain information on him. He refused saying that his wife can visit, but is not allowed to call and gain information.

## 2018-09-25 NOTE — H&P (Signed)
[place] [facility] [place] NAME: Albert Sanders    MR#:  [id number]  DATE OF BIRTH:  08/20/1966  DATE OF ADMISSION:  09/24/2018  PRIMARY CARE PHYSICIAN: Guadalupe Maple, MD   REQUESTING/REFERRING PHYSICIAN:   CHIEF COMPLAINT:   Chief Complaint  Patient presents with  . Drug Overdose    HISTORY OF PRESENT ILLNESS: Albert Sanders  is a 52 y.o. male with a known history of illicit drug abuse, CLL, kidney stones and other comorbidities. Patient is currently very drowsy, secondary to heroin overdose, unable to provide history.  Information was taken from reviewing the medical chart and from discussion with emergency room physician. Apparently, he was found by the family very lethargic, with a spoon containing white substance next to him.  He received Narcan by EMS and became more arousable, with some vomiting while on route. Blood test done emergency room are notable for WBC at 21.7.  Lactic acid level is 1.5.  Urine drug screen is positive for opiates and benzodiazepine. While in the emergency room patient was noted febrile with a temperature at 102.4.  Heart rate was elevated as well at 110. EKG shows sinus tachycardia with heart rate at 123 bpm, normal axis, normal intervals, no acute ischemic changes. Chest x-ray shows bibasilar airspace disease suspicious for pneumonia. Patient is admitted for further evaluation and treatment.  PAST MEDICAL HISTORY:   Past Medical History:  Diagnosis Date  . Arthritis   . Clavicular fracture   . CLL (chronic lymphocytic leukemia) ([place])   . Complication of anesthesia    woke up during gallbladder surgery-pt states it takes alot of anesthesia to sedate him   . Erectile dysfunction   . GERD (gastroesophageal reflux disease)   . History of kidney stones   . Morbid obesity ([place])   . Urinary calculus     PAST SURGICAL HISTORY:  Past Surgical History:  Procedure Laterality Date  . ANTERIOR CRUCIATE  LIGAMENT REPAIR    . BICEPS TENDON REPAIR Right   . BONE MARROW BIOPSY  08-2015  . CHOLECYSTECTOMY    . CYSTOSCOPY W/ RETROGRADES Bilateral 09/16/2015   Procedure: CYSTOSCOPY WITH RETROGRADE PYELOGRAM;  Surgeon: Nickie Retort, MD;  Location: [facility] ORS;  Service: Urology;  Laterality: Bilateral;  . HARDWARE REMOVAL Left 01/08/2018   Procedure: HARDWARE REMOVAL- DEEP HARDWARE;  Surgeon: Lovell Sheehan, MD;  Location: [facility] ORS;  Service: Orthopedics;  Laterality: Left;    SOCIAL HISTORY:  Social History   Tobacco Use  . Smoking status: Never Smoker  . Smokeless tobacco: Current User    Types: Snuff  Substance Use Topics  . Alcohol use: Yes    Comment: rare    FAMILY HISTORY:  Family History  Problem Relation Age of Onset  . Diabetes Paternal Uncle   . Cancer Paternal Grandmother        breast  . Diabetes Paternal Grandfather   . CAD Maternal Grandmother   . Heart disease Neg Hx   . Hypertension Neg Hx   . Stroke Neg Hx   . COPD Neg Hx     DRUG ALLERGIES:  Allergies  Allergen Reactions  . Contrast Media [Iodinated Diagnostic Agents] Anaphylaxis    REVIEW OF SYSTEMS:   Unable to obtain, due to patient being intoxicated.  MEDICATIONS AT HOME:  Prior to Admission medications   Medication Sig Start Date End Date Taking? Authorizing Provider  ibuprofen (ADVIL,MOTRIN) 200 MG tablet Take 800 mg by mouth 3 (  three) times daily as needed for headache or moderate pain.    [provider]  omeprazole (PRILOSEC OTC) 20 MG tablet Take 1 tablet (20 mg total) by mouth every morning. Patient not taking: Reported on 09/24/2018 10/06/16   Arnetha Courser, MD  oxymetazoline (AFRIN) 0.05 % nasal spray Place 1 spray into both nostrils daily as needed for congestion.    [provider]      PHYSICAL EXAMINATION:   VITAL SIGNS: Blood pressure 113/61, pulse (!) 110, temperature (!) 102.4 F (39.1 C), temperature source Oral, resp. rate (!) 29, height '6\' 2"'  (1.88 m),  weight 131.5 kg, SpO2 97 %.  GENERAL:  52 y.o.-year-old patient lying in the bed with no acute distress.  He is very drowsy, confused. EYES: Pupils equal, round, reactive to light and accommodation. No scleral icterus. Extraocular muscles intact.  HEENT: Head atraumatic, normocephalic. Oropharynx and nasopharynx clear.  NECK:  Supple, no jugular venous distention. No thyroid enlargement, no tenderness.  LUNGS: Reduced breath sounds bilaterally, no wheezing. No use of accessory muscles of respiration.  CARDIOVASCULAR: Tachycardia.  S1, S2 normal. No S3/S4.  ABDOMEN: Soft, nontender, nondistended. Bowel sounds present. No organomegaly or mass.  EXTREMITIES: No pedal edema, cyanosis, or clubbing.  NEUROLOGIC EXAM: Is limited due to patient's altered mental status.  He is moving all his extremities, no focal weakness appreciated. PSYCHIATRIC: The patient is very drowsy, confused. SKIN: No obvious rash, lesion, or ulcer.   LABORATORY PANEL:   CBC Recent Labs  Lab 09/24/18 1910  WBC 21.7*  HGB 14.3  HCT 48.1  PLT 291  MCV 82.2  MCH 24.4*  MCHC 29.7*  RDW 17.8*  LYMPHSABS 5.0*  MONOABS 0.9  EOSABS 0.0  BASOSABS 0.1   ------------------------------------------------------------------------------------------------------------------  Chemistries  Recent Labs  Lab 09/24/18 1910  NA 138  K 4.6  CL 101  CO2 29  GLUCOSE 188*  BUN 19  CREATININE 1.08  CALCIUM 9.5  AST 47*  ALT 41  ALKPHOS 109  BILITOT 0.6   ------------------------------------------------------------------------------------------------------------------ estimated creatinine clearance is 116.6 mL/min (by C-G formula based on SCr of 1.08 mg/dL). ------------------------------------------------------------------------------------------------------------------ No results for input(s): TSH, T4TOTAL, T3FREE, THYROIDAB in the last 72 hours.  Invalid input(s): FREET3   Coagulation profile No results for  input(s): INR, PROTIME in the last 168 hours. ------------------------------------------------------------------------------------------------------------------- No results for input(s): DDIMER in the last 72 hours. -------------------------------------------------------------------------------------------------------------------  Cardiac Enzymes No results for input(s): CKMB, TROPONINI, MYOGLOBIN in the last 168 hours.  Invalid input(s): CK ------------------------------------------------------------------------------------------------------------------ Invalid input(s): POCBNP  ---------------------------------------------------------------------------------------------------------------  Urinalysis    Component Value Date/Time   COLORURINE YELLOW 11/07/2017 1436   APPEARANCEUR CLEAR 11/07/2017 1436   APPEARANCEUR Clear 09/21/2015 0921   LABSPEC 1.024 11/07/2017 1436   LABSPEC 1.025 06/04/2013 0554   PHURINE < OR = 5.0 11/07/2017 1436   GLUCOSEU NEGATIVE 11/07/2017 1436   GLUCOSEU Negative 06/04/2013 0554   HGBUR NEGATIVE 11/07/2017 1436   BILIRUBINUR Negative 09/21/2015 0921   BILIRUBINUR Negative 06/04/2013 0554   KETONESUR NEGATIVE 11/07/2017 1436   PROTEINUR NEGATIVE 11/07/2017 1436   NITRITE Negative 09/21/2015 0921   NITRITE NEGATIVE 08/28/2015 1128   LEUKOCYTESUR Negative 09/21/2015 0921   LEUKOCYTESUR Negative 06/04/2013 0554     RADIOLOGY: Dg Chest Portable 1 View  Result Date: 09/24/2018 CLINICAL DATA:  Tachypnea EXAM: PORTABLE CHEST 1 VIEW COMPARISON:  07/22/2018 FINDINGS: Bibasilar airspace disease is new and may represent pneumonia. No effusion. Heart size normal. IMPRESSION: Bibasilar airspace disease suspicious for pneumonia. Electronically Signed  By: Franchot Gallo M.D.   On: 09/24/2018 19:49    EKG: Orders placed or performed during the hospital encounter of 09/24/18  . ED EKG  . ED EKG  . EKG 12-Lead  . EKG 12-Lead    IMPRESSION AND  PLAN:  1.  Acute heroin overdose, somewhat improving after Narcan.  We will continue to monitor clinically closely, continue IV fluids.  Patient was placed under involuntary commitment, pending psychiatric evaluation. 2.  CAP, will start treatment with IV antibiotics. 3.  CLL, stable. WBC slightly elevated today, likely secondary to pneumonia.  Continue to monitor CBC daily.  Continue management per hematology service.   All the records are reviewed and case discussed with ED provider. Management plans discussed with the patient, family and they are in agreement.  CODE STATUS: Full Code Status History    Date Active Date Inactive Code Status Order ID Comments User Context   01/08/2018 1431 01/08/2018 1815 Full Code [id number]  Lovell Sheehan, MD Inpatient       TOTAL TIME TAKING CARE OF THIS PATIENT: 50 minutes.    Amelia Jo M.D on 09/25/2018 at 1:02 AM  Between 7am to 6pm - Pager - [phone]  After 6pm go to [email or web address] - password [email or web address] [facility]  [phone]  CC: Primary care physician; Guadalupe Maple, MD

## 2018-09-26 LAB — GLUCOSE, CAPILLARY: Glucose-Capillary: 102 mg/dL — ABNORMAL HIGH (ref 70–99)

## 2018-09-26 MED ORDER — METRONIDAZOLE 500 MG PO TABS: 500.0000 mg | ORAL_TABLET | Freq: Three times a day (TID) | ORAL | 0 refills | Status: DC

## 2018-09-26 MED ORDER — ALUM & MAG HYDROXIDE-SIMETH 200-200-20 MG/5ML PO SUSP
30.0000 mL | Freq: Once | ORAL | Status: AC
  Administered 2018-09-26: 12:00:00 30 mL via ORAL
  Filled 2018-09-26: qty 30

## 2018-09-26 MED ORDER — AMOXICILLIN-POT CLAVULANATE 875-125 MG PO TABS: 1.0000 | ORAL_TABLET | Freq: Two times a day (BID) | ORAL | 0 refills | Status: DC

## 2018-09-26 NOTE — Plan of Care (Signed)
  Problem: Health Behavior/Discharge Planning: Goal: Ability to manage health-related needs will improve Outcome: Progressing   Problem: Clinical Measurements: Goal: Ability to maintain clinical measurements within normal limits will improve Outcome: Progressing Goal: Will remain free from infection Outcome: Progressing Goal: Diagnostic test results will improve Outcome: Progressing Goal: Respiratory complications will improve Outcome: Progressing   Problem: Coping: Goal: Level of anxiety will decrease Outcome: Progressing   Problem: Pain Managment: Goal: General experience of comfort will improve Outcome: Progressing   Problem: Safety: Goal: Ability to remain free from injury will improve Outcome: Progressing   Problem: Skin Integrity: Goal: Risk for impaired skin integrity will decrease Outcome: Progressing

## 2018-09-26 NOTE — Discharge Summary (Signed)
[place] [place] [facility] [place] NAME: Albert Sanders    MR#:  [id number]  DATE OF BIRTH:  [birth year]  DATE OF ADMISSION:  09/24/2018 ADMITTING PHYSICIAN: Amelia Jo, MD  DATE OF DISCHARGE: No discharge date for patient encounter.  PRIMARY CARE PHYSICIAN: Guadalupe Maple, MD    ADMISSION DIAGNOSIS:  Diacetylmorphine overdose, undetermined intent, initial encounter ([place]) [T40.1X4A] Community acquired pneumonia, unspecified laterality [J18.9]  DISCHARGE DIAGNOSIS:  Principal Problem:   Opiate overdose ([place]) Active Problems:   CAP (community acquired pneumonia)   Intravenous drug abuse ([place])   Opiate abuse, continuous ([place])   SECONDARY DIAGNOSIS:   Past Medical History:  Diagnosis Date  . Arthritis   . Clavicular fracture   . CLL (chronic lymphocytic leukemia) ([place])   . Complication of anesthesia    woke up during gallbladder surgery-pt states it takes alot of anesthesia to sedate him   . Erectile dysfunction   . GERD (gastroesophageal reflux disease)   . History of kidney stones   . Morbid obesity ([place])   . Urinary calculus     HOSPITAL COURSE:  *Acute heroin overdose, unintentional Resolved Psychiatry did see patient while in house-IVC was discontinued, no further intervention was recommended  *Acute probable aspiration pneumonia Resolving Secondary to above, noted immunocompromise state from CLL Treated on our pneumonia protocol, provided empiric Zosyn while in house-we will change to Augmentin/Flagyl to complete antibiotic course  *CLL Stable We will need to follow-up with hematology status post discharge for continued care/medical management To follow-up with primary care provider status post discharge for reevaluation   DISCHARGE CONDITIONS:   stable  CONSULTS OBTAINED:  Treatment Team:  Clapacs, Madie Reno, MD  DRUG ALLERGIES:   Allergies  Allergen Reactions  . Contrast Media [Iodinated Diagnostic  Agents] Anaphylaxis    DISCHARGE MEDICATIONS:   Allergies as of 09/26/2018      Reactions   Contrast Media [iodinated Diagnostic Agents] Anaphylaxis      Medication List    STOP taking these medications   omeprazole 20 MG tablet Commonly known as:  PRILOSEC OTC     TAKE these medications   amoxicillin-clavulanate 875-125 MG tablet Commonly known as:  AUGMENTIN Take 1 tablet by mouth 2 (two) times daily.   ibuprofen 200 MG tablet Commonly known as:  ADVIL,MOTRIN Take 800 mg by mouth 3 (three) times daily as needed for headache or moderate pain.   metroNIDAZOLE 500 MG tablet Commonly known as:  FLAGYL Take 1 tablet (500 mg total) by mouth 3 (three) times daily.   oxymetazoline 0.05 % nasal spray Commonly known as:  AFRIN Place 1 spray into both nostrils daily as needed for congestion.        DISCHARGE INSTRUCTIONS:  If you experience worsening of your admission symptoms, develop shortness of breath, life threatening emergency, suicidal or homicidal thoughts you must seek medical attention immediately by calling 911 or calling your MD immediately  if symptoms less severe.  You Must read complete instructions/literature along with all the possible adverse reactions/side effects for all the Medicines you take and that have been prescribed to you. Take any new Medicines after you have completely understood and accept all the possible adverse reactions/side effects.   Please note  You were cared for by a hospitalist during your hospital stay. If you have any questions about your discharge medications or the care you received while you were in the hospital after you are discharged, you can call the unit and  asked to speak with the hospitalist on call if the hospitalist that took care of you is not available. Once you are discharged, your primary care physician will handle any further medical issues. Please note that NO REFILLS for any discharge medications will be authorized once  you are discharged, as it is imperative that you return to your primary care physician (or establish a relationship with a primary care physician if you do not have one) for your aftercare needs so that they can reassess your need for medications and monitor your lab values.    Today   CHIEF COMPLAINT:   Chief Complaint  Patient presents with  . Drug Overdose    HISTORY OF PRESENT ILLNESS:  52 y.o. male with a known history of illicit drug abuse, CLL, kidney stones and other comorbidities. Patient is currently very drowsy, secondary to heroin overdose, unable to provide history.  Information was taken from reviewing the medical chart and from discussion with emergency room physician. Apparently, he was found by the family very lethargic, with a spoon containing white substance next to him.  He received Narcan by EMS and became more arousable, with some vomiting while on route. Blood test done emergency room are notable for WBC at 21.7.  Lactic acid level is 1.5.  Urine drug screen is positive for opiates and benzodiazepine. While in the emergency room patient was noted febrile with a temperature at 102.4.  Heart rate was elevated as well at 110. EKG shows sinus tachycardia with heart rate at 123 bpm, normal axis, normal intervals, no acute ischemic changes. Chest x-ray shows bibasilar airspace disease suspicious for pneumonia. Patient is admitted for further evaluation and treatment. VITAL SIGNS:  Blood pressure (!) 142/70, pulse 81, temperature 98.3 F (36.8 C), resp. rate 19, height 6\' 1"  (1.854 m), weight 129 kg, SpO2 97 %.  I/O:    Intake/Output Summary (Last 24 hours) at 09/26/2018 1014 Last data filed at 09/25/2018 2256 Gross per 24 hour  Intake 1531.33 ml  Output -  Net 1531.33 ml    PHYSICAL EXAMINATION:  GENERAL:  52 y.o.-year-old patient lying in the bed with no acute distress.  EYES: Pupils equal, round, reactive to light and accommodation. No scleral icterus.  Extraocular muscles intact.  HEENT: Head atraumatic, normocephalic. Oropharynx and nasopharynx clear.  NECK:  Supple, no jugular venous distention. No thyroid enlargement, no tenderness.  LUNGS: Normal breath sounds bilaterally, no wheezing, rales,rhonchi or crepitation. No use of accessory muscles of respiration.  CARDIOVASCULAR: S1, S2 normal. No murmurs, rubs, or gallops.  ABDOMEN: Soft, non-tender, non-distended. Bowel sounds present. No organomegaly or mass.  EXTREMITIES: No pedal edema, cyanosis, or clubbing.  NEUROLOGIC: Cranial nerves II through XII are intact. Muscle strength 5/5 in all extremities. Sensation intact. Gait not checked.  PSYCHIATRIC: The patient is alert and oriented x 3.  SKIN: No obvious rash, lesion, or ulcer.   DATA REVIEW:   CBC Recent Labs  Lab 09/25/18 1404  WBC 19.3*  HGB 13.0  HCT 43.4  PLT 262    Chemistries  Recent Labs  Lab 09/24/18 1910  NA 138  K 4.6  CL 101  CO2 29  GLUCOSE 188*  BUN 19  CREATININE 1.08  CALCIUM 9.5  AST 47*  ALT 41  ALKPHOS 109  BILITOT 0.6    Cardiac Enzymes No results for input(s): TROPONINI in the last 168 hours.  Microbiology Results  Results for orders placed or performed during the hospital encounter of 09/24/18  Culture, blood (  routine x 2)     Status: None (Preliminary result)   Collection Time: 09/24/18 10:09 PM  Result Value Ref Range Status   Specimen Description BLOOD BLOOD RIGHT ARM  Final   Special Requests   Final    BOTTLES DRAWN AEROBIC AND ANAEROBIC Blood Culture adequate volume   Culture   Final    NO GROWTH 2 DAYS Performed at [facility], [address], [place], [place] [address]    Report Status PENDING  Incomplete  Culture, blood (routine x 2)     Status: None (Preliminary result)   Collection Time: 09/24/18 10:09 PM  Result Value Ref Range Status   Specimen Description BLOOD BLOOD LEFT WRIST  Final   Special Requests   Final    BOTTLES DRAWN AEROBIC AND ANAEROBIC  Blood Culture adequate volume   Culture   Final    NO GROWTH 2 DAYS Performed at [facility], [address], [place], [place] [address]    Report Status PENDING  Incomplete    RADIOLOGY:  Dg Chest 2 View  Result Date: 09/25/2018 CLINICAL DATA:  Altered mental status. Heroin abuse. Possible pneumonia. EXAM: CHEST - 2 VIEW COMPARISON:  09/24/2018. FINDINGS: Cardiomegaly with mild pulmonary venous congestion. Bilateral pulmonary interstitial prominence. Changes could be related to the pulmonary interstitial edema and/or pneumonitis. Mild bibasilar atelectasis. Improved aeration both lung bases from prior exam. No pleural effusion. No pneumothorax. Old healed left clavicular fracture. Surgical clips over right shoulder. IMPRESSION: 1. Cardiomegaly with mild pulmonary venous congestion. Mild bilateral from interstitial prominence. Changes could be related to mild interstitial edema. Bilateral pneumonitis cannot be excluded. 2. Bibasilar atelectasis. Improved aeration in both lungs from prior exam. Electronically Signed   By: Marcello Moores  Register   On: 09/25/2018 13:03   Dg Chest Portable 1 View  Result Date: 09/24/2018 CLINICAL DATA:  Tachypnea EXAM: PORTABLE CHEST 1 VIEW COMPARISON:  07/22/2018 FINDINGS: Bibasilar airspace disease is new and may represent pneumonia. No effusion. Heart size normal. IMPRESSION: Bibasilar airspace disease suspicious for pneumonia. Electronically Signed   By: Franchot Gallo M.D.   On: 09/24/2018 19:49    EKG:   Orders placed or performed during the hospital encounter of 09/24/18  . ED EKG  . ED EKG  . EKG 12-Lead  . EKG 12-Lead      Management plans discussed with the patient, family and they are in agreement.  CODE STATUS:     Code Status Orders  (From admission, onward)         Start     Ordered   09/25/18 0707  Full code  Continuous     09/25/18 0706        Code Status History    Date Active Date Inactive Code Status Order ID  Comments User Context   01/08/2018 1431 01/08/2018 1815 Full Code [id number]  Lovell Sheehan, MD Inpatient      TOTAL TIME TAKING CARE OF THIS PATIENT: 40 minutes.    Avel Peace Salary M.D on 09/26/2018 at 10:14 AM  Between 7am to 6pm - Pager - [phone]  After 6pm go to [email or web address] - password [email or web address] [facility]  Office  [phone]  CC: Primary care physician; Guadalupe Maple, MD   Note: This dictation was prepared with Dragon dictation along with smaller phrase technology. Any transcriptional errors that result from this process are unintentional.

## 2018-09-27 LAB — HIV ANTIBODY (ROUTINE TESTING W REFLEX): HIV Screen 4th Generation wRfx: NONREACTIVE

## 2018-09-29 LAB — CULTURE, BLOOD (ROUTINE X 2)
Culture: NO GROWTH
Culture: NO GROWTH
SPECIAL REQUESTS: ADEQUATE
Special Requests: ADEQUATE

## 2018-10-03 ENCOUNTER — Inpatient Hospital Stay: Payer: Self-pay | Admitting: Family Medicine

## 2019-06-15 ENCOUNTER — Encounter: Payer: Self-pay | Admitting: Emergency Medicine

## 2019-06-15 ENCOUNTER — Emergency Department: Payer: Self-pay

## 2019-06-15 ENCOUNTER — Other Ambulatory Visit: Payer: Self-pay

## 2019-06-15 ENCOUNTER — Emergency Department
Admission: EM | Admit: 2019-06-15 | Discharge: 2019-06-16 | Disposition: A | Discharge status: Home / Self Care | Payer: Self-pay | Attending: Emergency Medicine | Admitting: Emergency Medicine

## 2019-06-15 DIAGNOSIS — J40 Bronchitis, not specified as acute or chronic: Secondary | ICD-10-CM | POA: Insufficient documentation

## 2019-06-15 DIAGNOSIS — Z20828 Contact with and (suspected) exposure to other viral communicable diseases: Secondary | ICD-10-CM | POA: Insufficient documentation

## 2019-06-15 DIAGNOSIS — R509 Fever, unspecified: Secondary | ICD-10-CM

## 2019-06-15 HISTORY — DX: Disorder of kidney and ureter, unspecified: N28.9

## 2019-06-15 LAB — CBC WITH DIFFERENTIAL/PLATELET
Abs Immature Granulocytes: 0.18 10*3/uL — ABNORMAL HIGH (ref 0.00–0.07)
Basophils Absolute: 0.1 10*3/uL (ref 0.0–0.1)
Basophils Relative: 0 %
Eosinophils Absolute: 0.1 10*3/uL (ref 0.0–0.5)
Eosinophils Relative: 1 %
HCT: 42.4 % (ref 39.0–52.0)
Hemoglobin: 13.2 g/dL (ref 13.0–17.0)
Immature Granulocytes: 1 %
Lymphocytes Relative: 38 %
Lymphs Abs: 8.6 10*3/uL — ABNORMAL HIGH (ref 0.7–4.0)
MCH: 25.8 pg — ABNORMAL LOW (ref 26.0–34.0)
MCHC: 31.1 g/dL (ref 30.0–36.0)
MCV: 83 fL (ref 80.0–100.0)
Monocytes Absolute: 1.4 10*3/uL — ABNORMAL HIGH (ref 0.1–1.0)
Monocytes Relative: 6 %
Neutro Abs: 12.5 10*3/uL — ABNORMAL HIGH (ref 1.7–7.7)
Neutrophils Relative %: 54 %
Platelets: 251 10*3/uL (ref 150–400)
RBC: 5.11 MIL/uL (ref 4.22–5.81)
RDW: 13.7 % (ref 11.5–15.5)
WBC: 22.9 10*3/uL — ABNORMAL HIGH (ref 4.0–10.5)
nRBC: 0 % (ref 0.0–0.2)

## 2019-06-15 LAB — COMPREHENSIVE METABOLIC PANEL
ALT: 18 U/L (ref 0–44)
AST: 17 U/L (ref 15–41)
Albumin: 3.9 g/dL (ref 3.5–5.0)
Alkaline Phosphatase: 88 U/L (ref 38–126)
Anion gap: 7 (ref 5–15)
BUN: 24 mg/dL — ABNORMAL HIGH (ref 6–20)
CO2: 25 mmol/L (ref 22–32)
Calcium: 8.7 mg/dL — ABNORMAL LOW (ref 8.9–10.3)
Chloride: 102 mmol/L (ref 98–111)
Creatinine, Ser: 0.82 mg/dL (ref 0.61–1.24)
GFR calc Af Amer: >60 mL/min (ref >60)
GFR calc non Af Amer: >60 mL/min (ref >60)
Glucose, Bld: 121 mg/dL — ABNORMAL HIGH (ref 70–99)
Potassium: 4.4 mmol/L (ref 3.5–5.1)
Sodium: 134 mmol/L — ABNORMAL LOW (ref 135–145)
Total Bilirubin: 0.5 mg/dL (ref 0.3–1.2)
Total Protein: 7.2 g/dL (ref 6.5–8.1)

## 2019-06-15 LAB — LACTIC ACID, PLASMA
Lactic Acid, Venous: 1.1 mmol/L (ref 0.5–1.9)
Lactic Acid, Venous: 1.3 mmol/L (ref 0.5–1.9)

## 2019-06-15 MED ORDER — IBUPROFEN 600 MG PO TABS
600.0000 mg | ORAL_TABLET | Freq: Once | ORAL | Status: AC
  Administered 2019-06-15: 600 mg via ORAL
  Filled 2019-06-15: qty 1

## 2019-06-15 MED ORDER — SODIUM CHLORIDE 0.9 % IV BOLUS
1000.0000 mL | Freq: Once | INTRAVENOUS | Status: AC
  Administered 2019-06-15: 1000 mL via INTRAVENOUS

## 2019-06-15 NOTE — Discharge Instructions (Addendum)
Take antibiotic as prescribed (Keflex 500mg  three times daily x 7 days).  Your white count was elevated which is consistent with your prior diagnosis of CLL. Please follow up with the specialist.  You should continue taking Tylenol or Ibuprofen for your fevers.  Blood cultures are pending. You will be notified of any positive results.  Return to the ER for any other concerns.

## 2019-06-15 NOTE — ED Notes (Signed)
IV attempted x 2 by this RN. 

## 2019-06-15 NOTE — ED Provider Notes (Addendum)
[facility] Emergency Department Provider Note  ____________________________________________   First MD Initiated Contact with Patient 06/15/19 2042     (approximate)  I have reviewed the triage vital signs and the nursing notes.   HISTORY  Chief Complaint Generalized Body Aches and Fever    HPI Albert Sanders is a 53 y.o. male with CLL who presents for fevers.  Patient does not have known exposure to coronavirus patient.  Patient last took Tylenol at 66.    Patient said he was diagnosed with CLL but never did his 20-monthfollow-up.  Patient said he was feeling fine until today.  He felt more fatigued than normal he then started having chills and fevers.  Fevers have been intermittent, better with Tylenol, nothing seems to make it worse.  He endorses some very mild shortness of breath at some mild coughing.  He denies any chest pain, urinary symptoms, abdominal pain.    Past Medical History:  Diagnosis Date   Arthritis    Clavicular fracture    CLL (chronic lymphocytic leukemia) (HCC)    CLL   Complication of anesthesia    woke up during gallbladder surgery-pt states it takes alot of anesthesia to sedate him    Erectile dysfunction    GERD (gastroesophageal reflux disease)    History of kidney stones    Morbid [place]    Renal disorder    Urinary calculus     Patient Active Problem List   Diagnosis Date Noted   Opiate overdose ([place] 09/25/2018   Intravenous drug abuse ([place] 09/25/2018   Opiate abuse, continuous ([place] 09/25/2018   CAP (community acquired pneumonia) 09/24/2018   Prediabetes 11/08/2017   Pre-op examination 11/07/2017   Encounter for pain management counseling 11/07/2017   Osteoarthritis of knee 10/03/2017   Strain of knee 10/03/2017   Chronic kidney disease, 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   Hyperglycemia 10/07/2016   Prostate cancer screening 10/07/2016   Fatigue 10/07/2016   Snoring 10/07/2016    Erectile dysfunction 10/07/2016   Vitamin D deficiency 10/06/2015   Tenosynovitis 09/27/2015   GERD (gastroesophageal reflux disease) 09/27/2015   Low back pain 09/26/2015   Obesity 09/26/2015   Abnormal urine 09/21/2015   Anxiety about health 09/21/2015   Testosterone deficiency 09/18/2015   Urinary calculus    Clavicular fracture    Flank pain 08/28/2015   Lymphocytosis 08/20/2015   Chronic lymphocytic leukemia ([place] 08/20/2015   Adenopathy 07/31/2015   Night sweats 07/31/2015   Splenomegaly 07/31/2015    Past Surgical History:  Procedure Laterality Date   ANTERIOR CRUCIATE LIGAMENT REPAIR     BICEPS TENDON REPAIR Right    BONE MARROW BIOPSY  08-2015   CHOLECYSTECTOMY     CYSTOSCOPY W/ RETROGRADES Bilateral 09/16/2015   Procedure: CYSTOSCOPY WITH RETROGRADE PYELOGRAM;  Surgeon: BNickie Retort MD;  Location: [facility] ORS;  Service: Urology;  Laterality: Bilateral;   HARDWARE REMOVAL Left 01/08/2018   Procedure: HARDWARE REMOVAL- DEEP HARDWARE;  Surgeon: BLovell Sheehan MD;  Location: [facility] ORS;  Service: Orthopedics;  Laterality: Left;    Prior to Admission medications   Medication Sig Start Date End Date Taking? Authorizing Provider  amoxicillin-clavulanate (AUGMENTIN) 875-125 MG tablet Take 1 tablet by mouth 2 (two) times daily. 09/26/18   Salary, MAvel Peace MD  ibuprofen (ADVIL,MOTRIN) 200 MG tablet Take 800 mg by mouth 3 (three) times daily as needed for headache or moderate pain.    [provider]  metroNIDAZOLE (FLAGYL) 500 MG tablet  Take 1 tablet (500 mg total) by mouth 3 (three) times daily. 09/26/18   Salary, Avel Peace, MD  oxymetazoline (AFRIN) 0.05 % nasal spray Place 1 spray into both nostrils daily as needed for congestion.    [provider]    Allergies Contrast media [iodinated diagnostic agents]  Family History  Problem Relation Age of Onset   Diabetes Paternal Uncle    Cancer Paternal Grandmother         breast   Diabetes Paternal Grandfather    CAD Maternal Grandmother    Heart disease Neg Hx    Hypertension Neg Hx    Stroke Neg Hx    COPD Neg Hx     Social History Social History   Tobacco Use   Smoking status: Never Smoker   Smokeless tobacco: Current User    Types: Snuff  Substance Use Topics   Alcohol use: Not Currently    Comment: rare   Drug use: Not Currently    Types: IV, Heroin    Comment: about a gram a day      Review of Systems Constitutional: Positive for fever and chills Eyes: No visual changes. ENT: No sore throat. Cardiovascular: Denies chest pain. Respiratory: Positive for cough and shortness of breath Gastrointestinal: No abdominal pain.  No nausea, no vomiting.  No diarrhea.  No constipation. Genitourinary: Negative for dysuria. Musculoskeletal: Negative for back pain. Skin: Negative for rash. Neurological: Negative for headaches, focal weakness or numbness. All other ROS negative ____________________________________________   PHYSICAL EXAM:  VITAL SIGNS: ED Triage Vitals  Enc Vitals Group     BP 06/15/19 1919 137/83     Pulse Rate 06/15/19 1919 100     Resp 06/15/19 1919 20     Temp 06/15/19 1919 (!) 100.7 F (38.2 C)     Temp Source 06/15/19 1919 Oral     SpO2 06/15/19 1919 98 %     Weight 06/15/19 1920 285 lb (129.3 kg)     Height 06/15/19 1920 '6\' 1"'  (1.854 m)     Head Circumference --      Peak Flow --      Pain Score 06/15/19 1919 3     Pain Loc --      Pain Edu? --      Excl. in [place]? --     Constitutional: Alert and oriented. Well appearing and in no acute distress.  Warm feeling Eyes: Conjunctivae are normal. EOMI. Head: Atraumatic. Nose: No congestion/rhinnorhea. Mouth/Throat: Mucous membranes are moist.   Neck: No stridor. Trachea Midline. FROM Cardiovascular: Normal rate, regular rhythm. Grossly normal heart sounds.  Good peripheral circulation. Respiratory: Normal respiratory effort.  No retractions. Lungs  CTAB. Gastrointestinal: Soft and nontender. No distention. No abdominal bruits.  Musculoskeletal: No lower extremity tenderness nor edema.  No joint effusions.  Full range of motion all extremities Neurologic:  Normal speech and language. No gross focal neurologic deficits are appreciated.  Skin:  Skin is warm, dry and intact. No rash noted. Psychiatric: Mood and affect are normal. Speech and behavior are normal. GU: Deferred   ____________________________________________   LABS (all labs ordered are listed, but only abnormal results are displayed)  Labs Reviewed  CBC WITH DIFFERENTIAL/PLATELET - Abnormal; Notable for the following components:      Result Value   WBC 22.9 (*)    MCH 25.8 (*)    Neutro Abs 12.5 (*)    Lymphs Abs 8.6 (*)    Monocytes Absolute 1.4 (*)  Abs Immature Granulocytes 0.18 (*)    All other components within normal limits  COMPREHENSIVE METABOLIC PANEL - Abnormal; Notable for the following components:   Sodium 134 (*)    Glucose, Bld 121 (*)    BUN 24 (*)    Calcium 8.7 (*)    All other components within normal limits  CULTURE, BLOOD (ROUTINE X 2)  CULTURE, BLOOD (ROUTINE X 2)  SARS CORONAVIRUS 2 (HOSPITAL ORDER, [place] [facility])  LACTIC ACID, PLASMA  LACTIC ACID, PLASMA  URINALYSIS, ROUTINE W REFLEX MICROSCOPIC   ____________________________________________ ____________________________________________  RADIOLOGY Robert Bellow, personally viewed and evaluated these images (plain radiographs) as part of my medical decision making, as well as reviewing the written report by the radiologist.  ED MD interpretation: X-rays reviewed and negative for pneumonia  Official radiology report(s): Dg Chest 1 View  Result Date: 06/15/2019 CLINICAL DATA:  Shortness of breath. Patient reports fever and generalized body aches. EXAM: CHEST  1 VIEW COMPARISON:  Radiograph 09/25/2018 FINDINGS: The cardiomediastinal contours are normal.  Atherosclerosis of the aortic arch. Mild diffuse interstitial thickening, increase from prior. No consolidation, pleural effusion, or pneumothorax. No acute osseous abnormalities are seen. IMPRESSION: 1. Mild diffuse interstitial thickening, increase from prior exam. This may be vascular congestion or bronchitis. 2.  Aortic Atherosclerosis (ICD10-I70.0). Electronically Signed   By: Keith Rake M.D.   On: 06/15/2019 22:13   Dg Shoulder Left Portable  Result Date: 06/15/2019 CLINICAL DATA:  Fever and body aches. Shortness of breath. No additional history provided or available. EXAM: LEFT SHOULDER - 1 VIEW COMPARISON:  Clavicle radiograph 10/25/2013 FINDINGS: There is no acute fracture or dislocation. Remote mid right clavicle fracture. Transscapular Y-view limited by soft tissue attenuation from habitus. Tiny soft tissue calcification adjacent to the lateral humeral head in the region of the rotator cuff insertion. Minimal acromioclavicular spurring. IMPRESSION: 1. No acute fracture or dislocation of the left shoulder. 2. Mild acromioclavicular spurring. Tiny soft tissue calcification in the region of the rotator cuff insertion may represent calcific tendinopathy. Electronically Signed   By: Keith Rake M.D.   On: 06/15/2019 22:08    ____________________________________________   PROCEDURES  Procedure(s) performed (including Critical Care):  Procedures   ____________________________________________   INITIAL IMPRESSION / ASSESSMENT AND PLAN / ED COURSE  Albert Sanders was evaluated in Emergency Department on 06/15/2019 for the symptoms described in the history of present illness. He was evaluated in the context of the global COVID-19 pandemic, which necessitated consideration that the patient might be at risk for infection with the SARS-CoV-2 virus that causes COVID-19. Institutional protocols and algorithms that pertain to the evaluation of patients at risk for COVID-19 are in a state  of rapid change based on information released by regulatory bodies including the CDC and federal and state organizations. These policies and algorithms were followed during the patient's care in the ED.    Patient is a well-appearing 53 year old with a fever.  Patient does have a history of CLL suspect that his white count will be elevated.  Will get blood cultures but low suspicion for bacteremia given how well-appearing the patient is.  patient has no neck stiffness to suggest meningitis.  Will get chest x-ray to evaluate for pneumonia, urine to evaluate for UTI.  Will get coronavirus testing.  We will continue to monitor patient.  I have low suspicion for ACS given no chest pain.  Low suspicion for PE given very minimal shortness of breath but will  get d-dimer to further evaluate.  Patient given 1 L of fluid and ibuprofen.  Patient already has had Tylenol earlier.  Patient's labs consistent with his white count that is around patient's baseline.  Other labs are reassuring.  Patient handed off to oncoming team pending UA and d-dimer and covid testing.  If negative patient continues to look well patient to be discharged home.   ____________________________________________   FINAL CLINICAL IMPRESSION(S) / ED DIAGNOSES   Final diagnoses:  Fever, unspecified fever cause      MEDICATIONS GIVEN DURING THIS VISIT:  Medications  ibuprofen (ADVIL) tablet 600 mg (600 mg Oral Given 06/15/19 2218)  sodium chloride 0.9 % bolus 1,000 mL (1,000 mLs Intravenous New Bag/Given 06/15/19 2227)     ED Discharge Orders    None       Note:  This document was prepared using Dragon voice recognition software and may include unintentional dictation errors.   Vanessa [place], MD 06/16/19 0001    Vanessa [place], MD 06/21/19 [phone]

## 2019-06-15 NOTE — ED Notes (Signed)
Pt states that he took tylenol x 3 at around 1845.

## 2019-06-15 NOTE — ED Triage Notes (Signed)
Pt arrives ambulatory to triage with c/o low grade fever and generalized body aches since this afternoon. Pt is in NAD and does not have any known exposure to a Covid + person.

## 2019-06-16 LAB — URINALYSIS, ROUTINE W REFLEX MICROSCOPIC
Bilirubin Urine: NEGATIVE
Glucose, UA: NEGATIVE mg/dL
Hgb urine dipstick: NEGATIVE
Ketones, ur: NEGATIVE mg/dL
Leukocytes,Ua: NEGATIVE
Nitrite: NEGATIVE
Protein, ur: NEGATIVE mg/dL
Specific Gravity, Urine: 1.023 (ref 1.005–1.030)
pH: 5 (ref 5.0–8.0)

## 2019-06-16 LAB — FIBRIN DERIVATIVES D-DIMER (ARMC ONLY): Fibrin derivatives D-dimer (ARMC): 412.15 ng/mL (FEU) (ref 0.00–499.00)

## 2019-06-16 LAB — SARS CORONAVIRUS 2 BY RT PCR (HOSPITAL ORDER, PERFORMED IN ~~LOC~~ HOSPITAL LAB): SARS Coronavirus 2: NEGATIVE

## 2019-06-16 MED ORDER — ACETAMINOPHEN 325 MG PO TABS
650.0000 mg | ORAL_TABLET | Freq: Once | ORAL | Status: AC
  Administered 2019-06-16: 650 mg via ORAL

## 2019-06-16 MED ORDER — SODIUM CHLORIDE 0.9 % IV SOLN
1.0000 g | Freq: Once | INTRAVENOUS | Status: AC
  Administered 2019-06-16: 1 g via INTRAVENOUS
  Filled 2019-06-16: qty 10

## 2019-06-16 MED ORDER — ACETAMINOPHEN 325 MG PO TABS
ORAL_TABLET | ORAL | Status: AC
  Filled 2019-06-16: qty 2

## 2019-06-16 MED ORDER — CEPHALEXIN 500 MG PO CAPS: 500.0000 mg | ORAL_CAPSULE | Freq: Three times a day (TID) | ORAL | 0 refills | Status: DC

## 2019-06-16 NOTE — ED Notes (Signed)
Pt given graham crackers and PB.

## 2019-06-16 NOTE — ED Provider Notes (Signed)
-----------------------------------------   2:31 AM on 06/16/2019 -----------------------------------------  Patient resting in no acute distress.  Eating a snack of graham crackers and peanut butter.  Updated him on negative COVID and urinalysis.  Awaiting results of d-dimer.   ----------------------------------------- 3:44 AM on 06/16/2019 -----------------------------------------  Patient sleeping no acute distress.  Awaken him to update him of negative d-dimer.  I personally reviewed patient's lab work and imaging studies.  Given his cough and probable bronchitis on chest x-ray, coupled with his CLL, will give IV Rocephin now and discharged home on Keflex.  He has seen Dr. Mike Gip in the past and I will give him information to follow-up in the clinic.  Blood cultures are pending.  Strict return precautions given.  Patient verbalizes understanding and agrees with plan of care.  Left shoulder imaging was taken for chronic shoulder pain likely secondary to rotator cuff issues.   Paulette Blanch, MD 06/16/19 [phone]

## 2019-06-19 NOTE — Progress Notes (Signed)
[facility]  [address] [place], [place] [address] Phone: [phone]  Fax: [phone]   Clinic Day:  06/21/2019  Referring physician: No ref. provider found  Chief Complaint: Albert Sanders is a 53 y.o. male with chronic lymphocytic leukemia (CLL) who is seen for 1.5 year reassessment after recent hospitalization.   HPI: The patient was last seen in the medical oncology clinic on 12/22/2017. At that time, he denied any B symptoms.  He denied any infections, bruising or bleeding.  He was scheduled for left knee surgery.  Exam revealed no adenopathy or hepatosplenomegaly.  Hemoglobin and platelet count were normal.  WBC was mildly elevated (12,000) with an [place] is 6300.  ALC was 4500.  He underwent left knee surgery with Dr. Kurtis Bushman to remove hardware due to left knee pain on 01/08/2019.   He presented to the ED following a drug overdose with heroin on 07/23/2019.  He received 2 doses of Narcan and 3 rounds of CPR. He left the ED AMA. He was admitted to [facility] from 09/25/2018 - 09/26/2018 with a heroin overdose. He was diagnosed with a probable aspiration pneumonia.  He was treated with Zosyn and discharged on Augmentin and Flagyl.  He presented to the ED for a fever and cough on 06/15/2019.  COVID-19 testing was negative. CXR showed mild diffuse interstitial thickening, increased from prior exam, possible vascular congestion or bronchitis. Labs showed: hematocrit 42.4, hemoglobin 13.2, MCV 83.0, and WBC 22,900 (ANC 12,500, ALC 8,600).  Lactic acid was 1.3. Urinalysis was negative.  Blood and urine cultures were negative.  During the interim, he is doing well. He denies any infections, bruising, or bleeding. He reports a fever, sweats, and chills several times over the past few weeks. He reports improvement of his lower extremity edema.   He recently went into rehab for his opioid addiction, which began following a knee surgery several years ago. He  has been clean for 2 months, and has been going to the methadone clinic. He is hoping to come off of methadone soon.    Past Medical History:  Diagnosis Date   Arthritis    Clavicular fracture    CLL (chronic lymphocytic leukemia) (HCC)    CLL   Complication of anesthesia    woke up during gallbladder surgery-pt states it takes alot of anesthesia to sedate him    Erectile dysfunction    GERD (gastroesophageal reflux disease)    History of kidney stones    Morbid obesity (HCC)    Renal disorder    Urinary calculus     Past Surgical History:  Procedure Laterality Date   ANTERIOR CRUCIATE LIGAMENT REPAIR     BICEPS TENDON REPAIR Right    BONE MARROW BIOPSY  08-2015   CHOLECYSTECTOMY     CYSTOSCOPY W/ RETROGRADES Bilateral 09/16/2015   Procedure: CYSTOSCOPY WITH RETROGRADE PYELOGRAM;  Surgeon: Nickie Retort, MD;  Location: [facility] ORS;  Service: Urology;  Laterality: Bilateral;   HARDWARE REMOVAL Left 01/08/2018   Procedure: HARDWARE REMOVAL- DEEP HARDWARE;  Surgeon: Lovell Sheehan, MD;  Location: [facility] ORS;  Service: Orthopedics;  Laterality: Left;    Family History  Problem Relation Age of Onset   Diabetes Paternal Uncle    Cancer Paternal Grandmother        breast   Diabetes Paternal Grandfather    CAD Maternal Grandmother    Heart disease Neg Hx    Hypertension Neg Hx    Stroke Neg Hx  COPD Neg Hx     Social History:  reports that he has never smoked. His smokeless tobacco use includes snuff. He reports previous alcohol use. He reports previous drug use. Drugs: IV and Heroin. He notes exposure to various toxins. He dips 1 can a day. He rarely drinks alcohol. He works as a Development worker, community. His oldest daughter graduated college and is a Pharmacist, hospital. His other daughter is playing volleyball at college. He son is 38yo. He lives in [place] with his wife. He is alone today.  Allergies:  Allergies  Allergen Reactions   Contrast Media [Iodinated Diagnostic  Agents] Anaphylaxis    Current Medications: Current Outpatient Medications  Medication Sig Dispense Refill   omeprazole (PRILOSEC) 10 MG capsule Take 10 mg by mouth as needed.     oxymetazoline (AFRIN) 0.05 % nasal spray Place 1 spray into both nostrils daily as needed for congestion.     amoxicillin-clavulanate (AUGMENTIN) 875-125 MG tablet Take 1 tablet by mouth 2 (two) times daily. (Patient not taking: Reported on 06/21/2019) 8 tablet 0   cephALEXin (KEFLEX) 500 MG capsule Take 1 capsule (500 mg total) by mouth 3 (three) times daily. (Patient not taking: Reported on 06/21/2019) 21 capsule 0   ibuprofen (ADVIL,MOTRIN) 200 MG tablet Take 800 mg by mouth 3 (three) times daily as needed for headache or moderate pain.     metroNIDAZOLE (FLAGYL) 500 MG tablet Take 1 tablet (500 mg total) by mouth 3 (three) times daily. (Patient not taking: Reported on 06/21/2019) 12 tablet 0   No current facility-administered medications for this visit.     Review of Systems  Constitutional: Negative for chills (intermittent), diaphoresis (intermittent), fever (intermittent), malaise/fatigue and weight loss (up 13lbs).       Doing well.  HENT: Negative.  Negative for congestion, hearing loss, sinus pain and sore throat.   Eyes: Negative.  Negative for blurred vision.  Respiratory: Negative.  Negative for cough, shortness of breath and wheezing.   Cardiovascular: Negative.  Negative for chest pain, palpitations, orthopnea, leg swelling and PND.  Gastrointestinal: Negative.  Negative for abdominal pain, blood in stool, constipation, diarrhea, melena, nausea and vomiting.  Genitourinary: Negative.  Negative for dysuria, frequency, hematuria and urgency.       Low testosterone.  Musculoskeletal: Negative.  Negative for back pain, joint pain and myalgias.  Skin: Negative.  Negative for rash.  Neurological: Negative.  Negative for dizziness, tingling, sensory change, weakness and headaches.    Endo/Heme/Allergies: Negative.  Does not bruise/bleed easily.  Psychiatric/Behavioral: Negative.  Negative for depression, memory loss and substance abuse. The patient is not nervous/anxious and does not have insomnia.   All other systems reviewed and are negative.  Performance status (ECOG): 1  Vitals Blood pressure 116/75, pulse 79, temperature (!) 97 F (36.1 C), temperature source Tympanic, resp. rate 20, weight 286 lb 9.6 oz (130 kg).   Physical Exam  Constitutional: He is oriented to person, place, and time. He appears well-developed and well-nourished. No distress.  HENT:  Head: Normocephalic and atraumatic.  Mouth/Throat: Oropharynx is clear and moist. No oropharyngeal exudate.  Short gray hair.  Mask.  Eyes: Pupils are equal, round, and reactive to light. Conjunctivae and EOM are normal. No scleral icterus.  Blue eyes.  Neck: Normal range of motion. Neck supple. No JVD present.  Cardiovascular: Normal rate, regular rhythm and normal heart sounds.  No murmur heard. Pulmonary/Chest: Effort normal and breath sounds normal. No respiratory distress. He has no wheezes.  Abdominal: Soft. Bowel sounds  are normal. He exhibits no distension and no mass. There is no abdominal tenderness. There is no rebound and no guarding.  Musculoskeletal: Normal range of motion.        General: No edema.  Lymphadenopathy:    He has no cervical adenopathy.    He has no axillary adenopathy.       Right: No supraclavicular adenopathy present.       Left: No supraclavicular adenopathy present.  Neurological: He is alert and oriented to person, place, and time.  Skin: Skin is warm and dry. No rash noted. He is not diaphoretic. No erythema.  Psychiatric: He has a normal mood and affect. His behavior is normal. Judgment and thought content normal.  Nursing note and vitals reviewed.   No visits with results within 3 Day(s) from this visit.  Latest known visit with results is:  Admission on  06/15/2019, Discharged on 06/16/2019  Component Date Value Ref Range Status   WBC 06/15/2019 22.9* 4.0 - 10.5 K/uL Final   RBC 06/15/2019 5.11  4.22 - 5.81 MIL/uL Final   Hemoglobin 06/15/2019 13.2  13.0 - 17.0 g/dL Final   HCT 06/15/2019 42.4  39.0 - 52.0 % Final   MCV 06/15/2019 83.0  80.0 - 100.0 fL Final   MCH 06/15/2019 25.8* 26.0 - 34.0 pg Final   MCHC 06/15/2019 31.1  30.0 - 36.0 g/dL Final   RDW 06/15/2019 13.7  11.5 - 15.5 % Final   Platelets 06/15/2019 251  150 - 400 K/uL Final   nRBC 06/15/2019 0.0  0.0 - 0.2 % Final   Neutrophils Relative % 06/15/2019 54  % Final   Neutro Abs 06/15/2019 12.5* 1.7 - 7.7 K/uL Final   Lymphocytes Relative 06/15/2019 38  % Final   Lymphs Abs 06/15/2019 8.6* 0.7 - 4.0 K/uL Final   Monocytes Relative 06/15/2019 6  % Final   Monocytes Absolute 06/15/2019 1.4* 0.1 - 1.0 K/uL Final   Eosinophils Relative 06/15/2019 1  % Final   Eosinophils Absolute 06/15/2019 0.1  0.0 - 0.5 K/uL Final   Basophils Relative 06/15/2019 0  % Final   Basophils Absolute 06/15/2019 0.1  0.0 - 0.1 K/uL Final   WBC Morphology 06/15/2019 SMUDGE CELLS   Final   RBC Morphology 06/15/2019 MORPHOLOGY UNREMARKABLE   Final   Smear Review 06/15/2019 MORPHOLOGY UNREMARKABLE   Final   Immature Granulocytes 06/15/2019 1  % Final   Abs Immature Granulocytes 06/15/2019 0.18* 0.00 - 0.07 K/uL Final   Performed at [facility], [address] [place], [place] [address]   Sodium 06/15/2019 134* 135 - 145 mmol/L Final   Potassium 06/15/2019 4.4  3.5 - 5.1 mmol/L Final   Chloride 06/15/2019 102  98 - 111 mmol/L Final   CO2 06/15/2019 25  22 - 32 mmol/L Final   Glucose, Bld 06/15/2019 121* 70 - 99 mg/dL Final   BUN 06/15/2019 24* 6 - 20 mg/dL Final   Creatinine, Ser 06/15/2019 0.82  0.61 - 1.24 mg/dL Final   Calcium 06/15/2019 8.7* 8.9 - 10.3 mg/dL Final   Total Protein 06/15/2019 7.2  6.5 - 8.1 g/dL Final   Albumin 06/15/2019 3.9  3.5 - 5.0  g/dL Final   AST 06/15/2019 17  15 - 41 U/L Final   ALT 06/15/2019 18  0 - 44 U/L Final   Alkaline Phosphatase 06/15/2019 88  38 - 126 U/L Final   Total Bilirubin 06/15/2019 0.5  0.3 - 1.2 mg/dL Final   GFR calc non  Af Amer 06/15/2019 >60  >60 mL/min Final   GFR calc Af Amer 06/15/2019 >60  >60 mL/min Final   Anion gap 06/15/2019 7  5 - 15 Final   Performed at [facility], [place], [place] [address]   Color, Urine 06/16/2019 YELLOW* YELLOW Final   APPearance 06/16/2019 CLEAR* CLEAR Final   Specific Gravity, Urine 06/16/2019 1.023  1.005 - 1.030 Final   pH 06/16/2019 5.0  5.0 - 8.0 Final   Glucose, UA 06/16/2019 NEGATIVE  NEGATIVE mg/dL Final   Hgb urine dipstick 06/16/2019 NEGATIVE  NEGATIVE Final   Bilirubin Urine 06/16/2019 NEGATIVE  NEGATIVE Final   Ketones, ur 06/16/2019 NEGATIVE  NEGATIVE mg/dL Final   Protein, ur 06/16/2019 NEGATIVE  NEGATIVE mg/dL Final   Nitrite 06/16/2019 NEGATIVE  NEGATIVE Final   Leukocytes,Ua 06/16/2019 NEGATIVE  NEGATIVE Final   Performed at [facility], [address] [place], [place] [address]   Specimen Description 06/15/2019 BLOOD LEFT HAND   Final   Special Requests 06/15/2019 BOTTLES DRAWN AEROBIC AND ANAEROBIC Blood Culture adequate volume   Final   Culture 06/15/2019    Final                   Value:NO GROWTH 4 DAYS Performed at [facility], [address] [place], [place] [address]    Report Status 06/15/2019 PENDING   Incomplete   Specimen Description 06/16/2019 BLOOD LEFT HAND   Final   Special Requests 06/16/2019 BOTTLES DRAWN AEROBIC AND ANAEROBIC Blood Culture adequate volume   Final   Culture 06/16/2019    Final                   Value:NO GROWTH 5 DAYS Performed at [facility], [address], [place], [place] [address]    Report Status 06/16/2019 06/21/2019 FINAL   Final   Lactic Acid, Venous 06/15/2019 1.3  0.5 - 1.9 mmol/L Final   Performed at  [facility], [address] [place], [place] [address]   Lactic Acid, Venous 06/15/2019 1.1  0.5 - 1.9 mmol/L Final   Performed at [facility], [address] [place], [place] [address]   SARS Coronavirus 2 06/15/2019 NEGATIVE  NEGATIVE Final   Comment: (NOTE) If result is NEGATIVE SARS-CoV-2 target nucleic acids are NOT DETECTED. The SARS-CoV-2 RNA is generally detectable in upper and lower  respiratory specimens during the acute phase of infection. The lowest  concentration of SARS-CoV-2 viral copies this assay can detect is 250  copies / mL. A negative result does not preclude SARS-CoV-2 infection  and should not be used as the sole basis for treatment or other  patient management decisions.  A negative result may occur with  improper specimen collection / handling, submission of specimen other  than nasopharyngeal swab, presence of viral mutation(s) within the  areas targeted by this assay, and inadequate number of viral copies  (<250 copies / mL). A negative result must be combined with clinical  observations, patient history, and epidemiological information. If result is POSITIVE SARS-CoV-2 target nucleic acids are DETECTED. The SARS-CoV-2 RNA is generally detectable in upper and lower  respiratory specimens dur                          ing the acute phase of infection.  Positive  results are indicative of active infection with SARS-CoV-2.  Clinical  correlation with patient history and other diagnostic  information is  necessary to determine patient infection status.  Positive results do  not rule out bacterial infection or co-infection with other viruses. If result is PRESUMPTIVE POSTIVE SARS-CoV-2 nucleic acids MAY BE PRESENT.   A presumptive positive result was obtained on the submitted specimen  and confirmed on repeat testing.  While 2019 novel coronavirus  (SARS-CoV-2) nucleic acids may be present in the submitted sample  additional confirmatory  testing may be necessary for epidemiological  and / or clinical management purposes  to differentiate between  SARS-CoV-2 and other Sarbecovirus currently known to infect humans.  If clinically indicated additional testing with an alternate test  methodology [phone]) is advised. The SARS-CoV-2 RNA is generally  detectable in upper and lower respiratory sp                          ecimens during the acute  phase of infection. The expected result is Negative. Fact Sheet for Patients:  StrictlyIdeas.no Fact Sheet for Healthcare Providers: BankingDealers.co.za This test is not yet approved or cleared by the [place] FDA and has been authorized for detection and/or diagnosis of SARS-CoV-2 by FDA under an Emergency Use Authorization (EUA).  This EUA will remain in effect (meaning this test can be used) for the duration of the COVID-19 declaration under Section 564(b)(1) of the Act, 21 U.S.C. section 360bbb-3(b)(1), unless the authorization is terminated or revoked sooner. Performed at [facility], [place], [place] [address]    Fibrin derivatives D-dimer [facility]) 06/16/2019 412.15  0.00 - 499.00 ng/mL (FEU) Final   Comment: (NOTE) <> Exclusion of Venous Thromboembolism (VTE) - OUTPATIENT ONLY   (Emergency Department or [place])   0-499 ng/ml (FEU): With a low to intermediate pretest probability                      for VTE this test result excludes the diagnosis                      of VTE.   >499 ng/ml (FEU) : VTE not excluded; additional work up for VTE is                      required. <> Testing on Inpatients and Evaluation of Disseminated Intravascular   Coagulation (DIC) Reference Range:   0-499 ng/ml (FEU) Performed at [facility], [address], [place], [place] [address]     Assessment:  SAHEJ SCHRIEBER is a 53 y.o. 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  He was noted to  have abdominal adenopathy and mild splenomegaly on CT scan in 02/2015 following a fall.  He represented with abdominal pain, fever, and sweats.  Abdominal and pelvic CT scan without contrast on 07/25/2015 revealed abdominal adenopathy with the largest lymph node in the portacaval region measuring 1.9 cm (stable).  There was mild splenomegaly (15 cm).    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  Labs on 08/07/2015 revealed a hematocrit of 43, hemoglobin 13.9, platelets 200,000, white count 13,000 with an ANC of 500. Differential included 50%  segs and 42% lymphs with a mild lymphocytosis (5500).  Comprehensive metabolic, LDH, uric acid, sedimentation rate, SPEP, and immunoglobulins were normal.  PET scan on 08/07/2015 revealed no hypermetabolic lymphadenopathy or metastatic disease. There was a mildly enlarged gastrohepatic ligament, portacaval, and right external iliac lymph nodes (1.3-1.9 cm).  He had a top normal spleen size.   Bone marrow on 09/04/2015 revealed <10% involvement with B-cell lymphoproliferative neoplasm with a B-cell CLL/SLL immunophenotype.  There was mild non-specific dyserythropoiesis and mild increase in reticulin.  Storage iron was present. Flow cytometry revealed CD5+ monotypic (clonal) B cell population (20% of sample) with B-cell CLL/SLL immunophenotype.  Cytogenetics were normal (46, XY).  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  He has had infections.  He had a severe flexor tenosynovitis of the right 4th finger in 08/2015 after being bitten by a parrot.  He had debridement, antibiotics, and a skin graft.   He had an infection overlying his right wrist recently (unroofed by patient).  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  Cystoscopy with retrograde pyelogram on 09/16/2015 revealed no abnormalities.  Testosterone was 113 [phone]) on 08/28/2015.  He was admitted to [facility] from 09/25/2018 - 09/26/2018 with a heroin overdose and probable aspiration pneumonia.  He was seen in the ER for a fever and cough on 06/15/2019.  COVID-19 testing was negative.  Symptomatically, he is doing well.  He denies any B symptoms.  He has intermittent sweats.  Exam reveals no adenopathy or hepatosplenomegaly.  Hematocrit was 42.4, hemoglobin 13.2, and platelets 251,000 on 06/15/2019.  Plan: 1.   Review interval events and labs.  2.   Chronic lymphocytic leukemia  WBC 22,900 (ANC of 22,500; ALC 8600).  Clinically, he is doing well.  Exam reveals no adenopathy or hepatosplenomegaly.  Counts are stable.  Review indications for treatment.  Discuss plans for ongoing surveillance 3.   RTC in 6 months for MD assessment and labs (CBC with diff, CMP, LDH, uric acid).  I discussed the assessment and treatment plan with the patient.  The patient was provided an opportunity to ask questions and all were answered.  The patient agreed with the plan and demonstrated an understanding of the instructions.  The patient was advised to call back if the symptoms worsen or if the condition fails to improve as anticipated.   Lequita Asal, MD, PhD    06/21/2019, 9:09 AM  I, Molly Dorshimer, am acting as Education administrator for [employer]. Mike Gip, MD, PhD.  I, Arieon Scalzo C. Mike Gip, MD, have reviewed the above documentation for accuracy and completeness, and I agree with the above.

## 2019-06-20 ENCOUNTER — Other Ambulatory Visit: Payer: Self-pay

## 2019-06-21 ENCOUNTER — Encounter: Payer: Self-pay | Admitting: Hematology and Oncology

## 2019-06-21 ENCOUNTER — Inpatient Hospital Stay: Payer: Self-pay | Attending: Hematology and Oncology | Admitting: Hematology and Oncology

## 2019-06-21 VITALS — BP 116/75 | HR 79 | Temp 97.0°F | Resp 20 | Wt 286.6 lb

## 2019-06-21 DIAGNOSIS — C9110 Chronic lymphocytic leukemia of B-cell type not having achieved remission: Secondary | ICD-10-CM | POA: Insufficient documentation

## 2019-06-21 LAB — CULTURE, BLOOD (ROUTINE X 2)
Culture: NO GROWTH
Special Requests: ADEQUATE

## 2019-06-21 NOTE — Progress Notes (Signed)
Patient here for follow up appointment.  Patient reports fatigue, hot flashes, sweats at all times of the day, normal appetite, fevers, visit to the ED recommended that he see an Oncologist.

## 2019-06-27 LAB — CULTURE, BLOOD (ROUTINE X 2)
Culture: NO GROWTH
Special Requests: ADEQUATE

## 2019-12-20 ENCOUNTER — Other Ambulatory Visit: Payer: Self-pay

## 2019-12-20 ENCOUNTER — Inpatient Hospital Stay: Payer: Self-pay | Attending: Hematology and Oncology

## 2019-12-20 DIAGNOSIS — C9110 Chronic lymphocytic leukemia of B-cell type not having achieved remission: Secondary | ICD-10-CM | POA: Insufficient documentation

## 2019-12-20 LAB — CBC WITH DIFFERENTIAL/PLATELET
Abs Immature Granulocytes: 0.12 10*3/uL — ABNORMAL HIGH (ref 0.00–0.07)
Basophils Absolute: 0 10*3/uL (ref 0.0–0.1)
Basophils Relative: 0 %
Eosinophils Absolute: 0.2 10*3/uL (ref 0.0–0.5)
Eosinophils Relative: 1 %
HCT: 44.4 % (ref 39.0–52.0)
Hemoglobin: 13.5 g/dL (ref 13.0–17.0)
Immature Granulocytes: 1 %
Lymphocytes Relative: 56 %
Lymphs Abs: 9.3 10*3/uL — ABNORMAL HIGH (ref 0.7–4.0)
MCH: 25 pg — ABNORMAL LOW (ref 26.0–34.0)
MCHC: 30.4 g/dL (ref 30.0–36.0)
MCV: 82.4 fL (ref 80.0–100.0)
Monocytes Absolute: 0.9 10*3/uL (ref 0.1–1.0)
Monocytes Relative: 5 %
Neutro Abs: 6.1 10*3/uL (ref 1.7–7.7)
Neutrophils Relative %: 37 %
Platelets: 226 10*3/uL (ref 150–400)
RBC: 5.39 MIL/uL (ref 4.22–5.81)
RDW: 14.6 % (ref 11.5–15.5)
WBC: 16.6 10*3/uL — ABNORMAL HIGH (ref 4.0–10.5)
nRBC: 0 % (ref 0.0–0.2)

## 2019-12-20 LAB — LACTATE DEHYDROGENASE: LDH: 130 U/L (ref 98–192)

## 2019-12-20 LAB — COMPREHENSIVE METABOLIC PANEL
ALT: 25 U/L (ref 0–44)
AST: 18 U/L (ref 15–41)
Albumin: 3.8 g/dL (ref 3.5–5.0)
Alkaline Phosphatase: 92 U/L (ref 38–126)
Anion gap: 8 (ref 5–15)
BUN: 18 mg/dL (ref 6–20)
CO2: 26 mmol/L (ref 22–32)
Calcium: 8.8 mg/dL — ABNORMAL LOW (ref 8.9–10.3)
Chloride: 101 mmol/L (ref 98–111)
Creatinine, Ser: 0.81 mg/dL (ref 0.61–1.24)
GFR calc Af Amer: >60 mL/min (ref >60)
GFR calc non Af Amer: >60 mL/min (ref >60)
Glucose, Bld: 121 mg/dL — ABNORMAL HIGH (ref 70–99)
Potassium: 4.4 mmol/L (ref 3.5–5.1)
Sodium: 135 mmol/L (ref 135–145)
Total Bilirubin: 0.2 mg/dL — ABNORMAL LOW (ref 0.3–1.2)
Total Protein: 7.4 g/dL (ref 6.5–8.1)

## 2019-12-20 LAB — URIC ACID: Uric Acid, Serum: 5.8 mg/dL (ref 3.7–8.6)

## 2019-12-21 NOTE — Progress Notes (Signed)
[facility]  [address], [address] [place], [place] [address] Phone: [phone]  Fax: [phone]   [place] Office Visit:  12/23/2019  Referring physician: No ref. provider found  I connected with Albert Sanders on 12/23/19 at 8:49 AM by videoconferencing and verified that I was speaking with the correct person using 2 identifiers.  The patient was at home.  I discussed the limitations, risk, security and privacy concerns of performing an evaluation and management service by videoconferencing and the availability of in person appointments.  I also discussed with the patient that there may be a patient responsible charge related to this service.  The patient expressed understanding and agreed to proceed.   Chief Complaint: Albert Sanders is a 54 y.o. male with chronic lymphocytic leukemia (CLL)  who is seen for 6 month assessment   HPI: The patient was last seen in the medical oncology clinic on 06/21/2019. At that time, he was doing well.  He denied any B symptoms.  He had intermittent sweats.  Exam revealed no adenopathy or hepatosplenomegaly. Hematocrit was 42.4, hemoglobin 13.2, and platelets 251,000 on 06/15/2019.  Labs on 12/20/2019: Hematocrit 44.4, hemoglobin 13.5, platelets 226,000, WBC 16,600 (ANC 6,100; ALC 9,300).  Uric acid 5.8. LDH 130.   During the interim, he has done well.  He describes shortness of breath and wheezing.  He notes weight gain. His clothes began to fit differently; therefore he decided to change his diet.  I recommended My[employer]itness Pal and patient agreed. He is becoming more active by walking his dog a few times a week. He is interested in joining a gym.   He notes some sweats. A couple of nights in the past 6 months, he has been drenched in sweat. He denies any fevers or chills. He denies any infections.   Past Medical History:  Diagnosis Date  . Arthritis   . Clavicular fracture   . CLL (chronic lymphocytic leukemia) (HCC)      CLL  . Complication of anesthesia    woke up during gallbladder surgery-pt states it takes alot of anesthesia to sedate him   . Erectile dysfunction   . GERD (gastroesophageal reflux disease)   . History of kidney stones   . Morbid obesity ([place])   . Renal disorder   . Urinary calculus     Past Surgical History:  Procedure Laterality Date  . ANTERIOR CRUCIATE LIGAMENT REPAIR    . BICEPS TENDON REPAIR Right   . BONE MARROW BIOPSY  08-2015  . CHOLECYSTECTOMY    . CYSTOSCOPY W/ RETROGRADES Bilateral 09/16/2015   Procedure: CYSTOSCOPY WITH RETROGRADE PYELOGRAM;  Surgeon: Albert Retort, MD;  Location: [facility] ORS;  Service: Urology;  Laterality: Bilateral;  . HARDWARE REMOVAL Left 01/08/2018   Procedure: HARDWARE REMOVAL- DEEP HARDWARE;  Surgeon: Albert Sheehan, MD;  Location: [facility] ORS;  Service: Orthopedics;  Laterality: Left;    Family History  Problem Relation Age of Onset  . Diabetes Paternal Uncle   . Cancer Paternal Grandmother        breast  . Diabetes Paternal Grandfather   . CAD Maternal Grandmother   . Heart disease Neg Hx   . Hypertension Neg Hx   . Stroke Neg Hx   . COPD Neg Hx     Social History:  reports that he has never smoked. His smokeless tobacco use includes snuff. He reports previous alcohol use. He reports previous drug use. Drugs: IV and Heroin. He notes exposure to various  toxins. He dips 1 can a day. He rarely drinks alcohol.He works as a Development worker, community. His oldest daughter graduated college and is a Pharmacist, hospital. His other daughter is playing volleyball at college. He son is 80yo. He lives in [place] with his wife. The patient is alone today.  Participants in the patient's visit and their role in the encounter included the patient and Albert Sanders, CMA, today.  The intake visit was provided by Albert Sanders, CMA.   Allergies:  Allergies  Allergen Reactions  . Contrast Media [Iodinated Diagnostic Agents] Anaphylaxis    Current  Medications: Current Outpatient Medications  Medication Sig Dispense Refill  . ibuprofen (ADVIL,MOTRIN) 200 MG tablet Take 800 mg by mouth 3 (three) times daily as needed for headache or moderate pain.    Marland Kitchen omeprazole (PRILOSEC) 10 MG capsule Take 10 mg by mouth as needed.    Marland Kitchen oxymetazoline (AFRIN) 0.05 % nasal spray Place 1 spray into both nostrils daily as needed for congestion.    Marland Kitchen amoxicillin-clavulanate (AUGMENTIN) 875-125 MG tablet Take 1 tablet by mouth 2 (two) times daily. (Patient not taking: Reported on 06/21/2019) 8 tablet 0  . cephALEXin (KEFLEX) 500 MG capsule Take 1 capsule (500 mg total) by mouth 3 (three) times daily. (Patient not taking: Reported on 06/21/2019) 21 capsule 0  . metroNIDAZOLE (FLAGYL) 500 MG tablet Take 1 tablet (500 mg total) by mouth 3 (three) times daily. (Patient not taking: Reported on 06/21/2019) 12 tablet 0   No current facility-administered medications for this visit.    Review of Systems  Constitutional: Positive for diaphoresis (intermittent; 2 episodes in 6 months were drenching). Negative for chills, fever, malaise/fatigue and weight loss (gaining).       Doing well. Active.  HENT: Negative.  Negative for congestion, ear pain, hearing loss, nosebleeds, sinus pain and sore throat.   Eyes: Negative.  Negative for blurred vision and double vision.  Respiratory: Negative.  Negative for cough, sputum production, shortness of breath and wheezing.   Cardiovascular: Negative.  Negative for chest pain, palpitations, orthopnea, leg swelling and PND.  Gastrointestinal: Negative.  Negative for abdominal pain, blood in stool, constipation, diarrhea, melena, nausea and vomiting.  Genitourinary: Negative.  Negative for dysuria, frequency, hematuria and urgency.       Low testosterone.  Musculoskeletal: Negative.  Negative for back pain, joint pain and myalgias.  Skin: Negative.  Negative for rash.  Neurological: Negative.  Negative for dizziness, tingling, sensory  change, weakness and headaches.  Endo/Heme/Allergies: Negative.  Does not bruise/bleed easily.  Psychiatric/Behavioral: Negative.  Negative for depression, memory loss and substance abuse. The patient is not nervous/anxious and does not have insomnia.   All other systems reviewed and are negative.  Performance status (ECOG): 1  Physical Exam  Constitutional: He is oriented to person, place, and time. He appears well-developed and well-nourished. No distress.  HENT:  Head: Normocephalic and atraumatic.  Mouth/Throat: No oropharyngeal exudate.  Short hair with white goatee.  Eyes: Conjunctivae and EOM are normal. No scleral icterus.  Glasses.  Brown eyes.  Neurological: He is alert and oriented to person, place, and time.  Skin: He is not diaphoretic.  Psychiatric: He has a normal mood and affect. His behavior is normal. Judgment and thought content normal.  Nursing note reviewed.   No visits with results within 3 Day(s) from this visit.  Latest known visit with results is:  Appointment on 12/20/2019  Component Date Value Ref Range Status  . Uric Acid, Serum 12/20/2019 5.8  3.7 -  8.6 mg/dL Final   Performed at [facility], [address] [place], [place] [address]  . LDH 12/20/2019 130  98 - 192 U/L Final   Performed at [facility], [address] [place], [place] [address]  . Sodium 12/20/2019 135  135 - 145 mmol/L Final  . Potassium 12/20/2019 4.4  3.5 - 5.1 mmol/L Final  . Chloride 12/20/2019 101  98 - 111 mmol/L Final  . CO2 12/20/2019 26  22 - 32 mmol/L Final  . Glucose, Bld 12/20/2019 121* 70 - 99 mg/dL Final  . BUN 12/20/2019 18  6 - 20 mg/dL Final  . Creatinine, Ser 12/20/2019 0.81  0.61 - 1.24 mg/dL Final  . Calcium 12/20/2019 8.8* 8.9 - 10.3 mg/dL Final  . Total Protein 12/20/2019 7.4  6.5 - 8.1 g/dL Final  . Albumin 12/20/2019 3.8  3.5 - 5.0 g/dL Final  . AST 12/20/2019 18  15 - 41 U/L Final  . ALT 12/20/2019 25  0 - 44 U/L Final  .  Alkaline Phosphatase 12/20/2019 92  38 - 126 U/L Final  . Total Bilirubin 12/20/2019 0.2* 0.3 - 1.2 mg/dL Final  . GFR calc non Af Amer 12/20/2019 >60  >60 mL/min Final  . GFR calc Af Amer 12/20/2019 >60  >60 mL/min Final  . Anion gap 12/20/2019 8  5 - 15 Final   Performed at [facility], [address] [place], [place] [address]  . WBC 12/20/2019 16.6* 4.0 - 10.5 K/uL Final  . RBC 12/20/2019 5.39  4.22 - 5.81 MIL/uL Final  . Hemoglobin 12/20/2019 13.5  13.0 - 17.0 g/dL Final  . HCT 12/20/2019 44.4  39.0 - 52.0 % Final  . MCV 12/20/2019 82.4  80.0 - 100.0 fL Final  . MCH 12/20/2019 25.0* 26.0 - 34.0 pg Final  . MCHC 12/20/2019 30.4  30.0 - 36.0 g/dL Final  . RDW 12/20/2019 14.6  11.5 - 15.5 % Final  . Platelets 12/20/2019 226  150 - 400 K/uL Final  . nRBC 12/20/2019 0.0  0.0 - 0.2 % Final  . Neutrophils Relative % 12/20/2019 37  % Final  . Neutro Abs 12/20/2019 6.1  1.7 - 7.7 K/uL Final  . Lymphocytes Relative 12/20/2019 56  % Final  . Lymphs Abs 12/20/2019 9.3* 0.7 - 4.0 K/uL Final  . Monocytes Relative 12/20/2019 5  % Final  . Monocytes Absolute 12/20/2019 0.9  0.1 - 1.0 K/uL Final  . Eosinophils Relative 12/20/2019 1  % Final  . Eosinophils Absolute 12/20/2019 0.2  0.0 - 0.5 K/uL Final  . Basophils Relative 12/20/2019 0  % Final  . Basophils Absolute 12/20/2019 0.0  0.0 - 0.1 K/uL Final  . Immature Granulocytes 12/20/2019 1  % Final  . Abs Immature Granulocytes 12/20/2019 0.12* 0.00 - 0.07 K/uL Final   Performed at [facility], [address], [place], [place] [address]    Assessment:  Albert Sanders is a 54 y.o. 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 He was noted to have abdominal adenopathy and mild splenomegaly on CT scan in 02/2015 following a fall. He represented with abdominal pain, fever, and sweats. Abdominal and pelvic CT scanwithout contrast on 07/25/2015 revealed abdominal adenopathy with the largest lymph node in the  portacaval region measuring 1.9 cm (stable). There was mild splenomegaly (15 cm).   Flow cytometryon 08/20/2015 revealed a CD5 positive/CD23 positive clonal B-cell population with a CLL/SLL phenotype in 27% of leukocytes, which were CD38  negative. 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  Labs on 08/07/2015 revealed a hematocrit of 43, hemoglobin 13.9, platelets 200,000, white count 13,000 with an ANC of 500. Differential included 50% segs and 42% lymphs with a mild lymphocytosis (5500). Comprehensive metabolic, LDH, uric acid, sedimentation rate, SPEP, and immunoglobulins were normal.  PET scanon 08/07/2015 revealed no hypermetabolic lymphadenopathy or metastatic disease. There was a mildly enlarged gastrohepatic ligament, portacaval, and right external iliac lymph nodes (1.3-1.9 cm). He had a top normal spleen size.   Bone marrowon 09/04/2015 revealed <10% involvement with B-cell lymphoproliferative neoplasm with a B-cell CLL/SLL immunophenotype. There was mild non-specific dyserythropoiesis and mild increase in reticulin. Storage iron was present. Flow cytometry revealed CD5+ monotypic (clonal) B cell population (20% of sample) with B-cell CLL/SLL immunophenotype. Cytogenetics were normal (46, XY). FISH studies revealed deletion of TP53gene/17p.  He has had infections. He had a severe flexor tenosynovitis of the right 4th finger in 08/2015 after being bitten by a parrot. He had debridement, antibiotics, and a skin graft. He had an infection overlying his right wrist recently (unroofed by patient).  He has a history of anaphylaxisto contrast dye. Cystoscopy with retrograde pyelogram on 09/16/2015 revealed no abnormalities. Testosteronewas 113 [phone]) on 08/28/2015.  He was  admitted to [facility] from 09/25/2018 - 09/26/2018 with a heroin overdose and probable aspiration pneumonia.  He was seen in the ER for a fever and cough on 06/15/2019.  COVID-19 testing was negative.  Symptomatically, he is doing well.  He has a rare drenching sweat (2 times in 6 months).  He has had no interval infections.  Plan: 1.   Review labs from 12/20/2019. 2.   Chronic lymphocytic leukemia             WBC 16,600 (ANC of 6,100; ALC 9300).             Clinically, he continues to do well             Exam on 06/21/2019 revealed no adenopathy or hepatosplenomegaly.             White count has improved.             Patient currently has no indications for treatment.             Close ongoing surveillance unless any interval concerns. 3.   RTC in 6 months for MD assessment and labs (CBC with diff, CMP, LDH).  I discussed the assessment and treatment plan with the patient.  The patient was provided an opportunity to ask questions and all were answered.  The patient agreed with the plan and demonstrated an understanding of the instructions.  The patient was advised to call back if the symptoms worsen or if the condition fails to improve as anticipated.   Lequita Asal, MD, PhD    12/23/2019, 8:49 AM  I, Selena Batten, am acting as a scribe for Lequita Asal, MD.  I, Ivey Mike Gip, MD, have reviewed the above documentation for accuracy and completeness, and I agree with the above.

## 2019-12-22 ENCOUNTER — Encounter: Payer: Self-pay | Admitting: Hematology and Oncology

## 2019-12-22 NOTE — Progress Notes (Signed)
No new changes noted today. The patient name and DOB has been verified by phone today. 

## 2019-12-23 ENCOUNTER — Inpatient Hospital Stay (HOSPITAL_BASED_OUTPATIENT_CLINIC_OR_DEPARTMENT_OTHER): Payer: Self-pay | Admitting: Hematology and Oncology

## 2019-12-23 ENCOUNTER — Encounter: Payer: Self-pay | Admitting: Hematology and Oncology

## 2019-12-23 ENCOUNTER — Inpatient Hospital Stay: Payer: Self-pay

## 2019-12-23 DIAGNOSIS — C9110 Chronic lymphocytic leukemia of B-cell type not having achieved remission: Secondary | ICD-10-CM

## 2020-03-13 ENCOUNTER — Ambulatory Visit: Payer: Self-pay | Attending: Internal Medicine

## 2020-03-13 ENCOUNTER — Other Ambulatory Visit: Payer: Self-pay

## 2020-03-13 DIAGNOSIS — Z23 Encounter for immunization: Secondary | ICD-10-CM

## 2020-03-13 NOTE — Progress Notes (Signed)
   Covid-19 Vaccination Clinic  Name:  Albert Sanders    MRN: [id number] DOB: 1966-09-05  03/13/2020  Mr. Albert Sanders was observed post Covid-19 immunization for 15 minutes without incident. He was provided with Vaccine Information Sheet and instruction to access the V-Safe system.   Mr. Albert Sanders was instructed to call 911 with any severe reactions post vaccine: Marland Kitchen Difficulty breathing  . Swelling of face and throat  . A fast heartbeat  . A bad rash all over body  . Dizziness and weakness   Immunizations Administered    Name Date Dose VIS Date Route   Pfizer COVID-19 Vaccine 03/13/2020  9:25 AM 0.3 mL 11/08/2019 Intramuscular   Manufacturer: [employer], [employer]   Lot: [id number]   [place]: [id number]

## 2020-04-04 ENCOUNTER — Ambulatory Visit: Payer: Self-pay | Attending: Internal Medicine

## 2020-04-04 DIAGNOSIS — Z23 Encounter for immunization: Secondary | ICD-10-CM

## 2020-04-04 NOTE — Progress Notes (Signed)
   Covid-19 Vaccination Clinic  Name:  Albert Sanders    MRN: [id number] DOB: December 11, 1965  04/04/2020  Mr. Freytes was observed post Covid-19 immunization for 15 minutes without incident. He was provided with Vaccine Information Sheet and instruction to access the V-Safe system.   Mr. Viverito was instructed to call 911 with any severe reactions post vaccine: Marland Kitchen Difficulty breathing  . Swelling of face and throat  . A fast heartbeat  . A bad rash all over body  . Dizziness and weakness   Immunizations Administered    Name Date Dose VIS Date Route   Pfizer COVID-19 Vaccine 04/04/2020  9:51 AM 0.3 mL 01/22/2019 Intramuscular   Manufacturer: [place]   Lot: [id number]   [place]: [id number]

## 2020-06-19 NOTE — Progress Notes (Incomplete)
[facility]  [address] [place], [place] [address] Phone: [phone]  Fax: [phone]   Clinic Day:  06/19/2020  Referring physician: Guadalupe Maple, MD   Chief Complaint: Albert Sanders is a 54 y.o. male with chronic lymphocytic leukemia (CLL)  who is seen for 6 month assessment   HPI: The patient was last seen in the medical oncology clinic on 12/23/2019 via telemedicine. At that time, he was doing well.  He had a rare drenching sweat (2 times in 6 months).  He had no interval infections. Hematocrit was 44.4, hemoglobin 13.5, platelets 226,000, WBC 16,600 (ANC 6,100). Calcium was 8.8. Total bilirubin was 0.2. LDH was 130. Uric acid was 5.8. He had no indications for treatment.  The patient received the [place] COVID-19 vaccines on 03/13/2020 and 04/04/2020.  During the interim, ***   Past Medical History:  Diagnosis Date  . Arthritis   . Clavicular fracture   . CLL (chronic lymphocytic leukemia) (HCC)    CLL  . Complication of anesthesia    woke up during gallbladder surgery-pt states it takes alot of anesthesia to sedate him   . Erectile dysfunction   . GERD (gastroesophageal reflux disease)   . History of kidney stones   . Morbid obesity ([place])   . Renal disorder   . Urinary calculus     Past Surgical History:  Procedure Laterality Date  . ANTERIOR CRUCIATE LIGAMENT REPAIR    . BICEPS TENDON REPAIR Right   . BONE MARROW BIOPSY  08-2015  . CHOLECYSTECTOMY    . CYSTOSCOPY W/ RETROGRADES Bilateral 09/16/2015   Procedure: CYSTOSCOPY WITH RETROGRADE PYELOGRAM;  Surgeon: Nickie Retort, MD;  Location: [facility] ORS;  Service: Urology;  Laterality: Bilateral;  . HARDWARE REMOVAL Left 01/08/2018   Procedure: HARDWARE REMOVAL- DEEP HARDWARE;  Surgeon: Lovell Sheehan, MD;  Location: [facility] ORS;  Service: Orthopedics;  Laterality: Left;    Family History  Problem Relation Age of Onset  . Diabetes Paternal Uncle   . Cancer Paternal  Grandmother        breast  . Diabetes Paternal Grandfather   . CAD Maternal Grandmother   . Heart disease Neg Hx   . Hypertension Neg Hx   . Stroke Neg Hx   . COPD Neg Hx     Social History:  reports that he has never smoked. His smokeless tobacco use includes snuff. He reports previous alcohol use. He reports previous drug use. Drugs: IV and Heroin. He notes exposure to various toxins. He dips 1 can a day. He rarely drinks alcohol.He works as a Development worker, community. His oldest daughter graduated college and is a Pharmacist, hospital. His other daughter is playing volleyball at college. He son is 36yo. He lives in [place] with his wife. The patient is alone*** today.  Allergies:  Allergies  Allergen Reactions  . Contrast Media [Iodinated Diagnostic Agents] Anaphylaxis    Current Medications: Current Outpatient Medications  Medication Sig Dispense Refill  . amoxicillin-clavulanate (AUGMENTIN) 875-125 MG tablet Take 1 tablet by mouth 2 (two) times daily. (Patient not taking: Reported on 06/21/2019) 8 tablet 0  . cephALEXin (KEFLEX) 500 MG capsule Take 1 capsule (500 mg total) by mouth 3 (three) times daily. (Patient not taking: Reported on 06/21/2019) 21 capsule 0  . ibuprofen (ADVIL,MOTRIN) 200 MG tablet Take 800 mg by mouth 3 (three) times daily as needed for headache or moderate pain.    . metroNIDAZOLE (FLAGYL) 500 MG tablet Take 1 tablet (500 mg  total) by mouth 3 (three) times daily. (Patient not taking: Reported on 06/21/2019) 12 tablet 0  . omeprazole (PRILOSEC) 10 MG capsule Take 10 mg by mouth as needed.    Marland Kitchen oxymetazoline (AFRIN) 0.05 % nasal spray Place 1 spray into both nostrils daily as needed for congestion.     No current facility-administered medications for this visit.    Review of Systems  Constitutional: Positive for diaphoresis (intermittent; 2 episodes in 6 months were drenching). Negative for chills, fever, malaise/fatigue and weight loss (gaining).       Doing well. Active.  HENT:  Negative.  Negative for congestion, ear pain, hearing loss, nosebleeds, sinus pain and sore throat.   Eyes: Negative.  Negative for blurred vision and double vision.  Respiratory: Negative.  Negative for cough, sputum production, shortness of breath and wheezing.   Cardiovascular: Negative.  Negative for chest pain, palpitations, orthopnea, leg swelling and PND.  Gastrointestinal: Negative.  Negative for abdominal pain, blood in stool, constipation, diarrhea, melena, nausea and vomiting.  Genitourinary: Negative.  Negative for dysuria, frequency, hematuria and urgency.       Low testosterone.  Musculoskeletal: Negative.  Negative for back pain, joint pain and myalgias.  Skin: Negative.  Negative for rash.  Neurological: Negative.  Negative for dizziness, tingling, sensory change, weakness and headaches.  Endo/Heme/Allergies: Negative.  Does not bruise/bleed easily.  Psychiatric/Behavioral: Negative.  Negative for depression, memory loss and substance abuse. The patient is not nervous/anxious and does not have insomnia.   All other systems reviewed and are negative.  Performance status (ECOG): 1***  Physical Exam Nursing note reviewed.  Constitutional:      General: He is not in acute distress.    Appearance: He is well-developed. He is not diaphoretic.  HENT:     Head: Normocephalic and atraumatic.     Mouth/Throat:     Pharynx: No oropharyngeal exudate.  Eyes:     General: No scleral icterus.    Conjunctiva/sclera: Conjunctivae normal.     Comments: Glasses.  Brown eyes.  Neurological:     Mental Status: He is alert and oriented to person, place, and time.  Psychiatric:        Behavior: Behavior normal.        Thought Content: Thought content normal.        Judgment: Judgment normal.     No visits with results within 3 Day(s) from this visit.  Latest known visit with results is:  Appointment on 12/20/2019  Component Date Value Ref Range Status  . Uric Acid, Serum 12/20/2019  5.8  3.7 - 8.6 mg/dL Final   Performed at [facility], [address] [place], [place] [address]  . LDH 12/20/2019 130  98 - 192 U/L Final   Performed at [facility], [address], [place], [place] [address]  . Sodium 12/20/2019 135  135 - 145 mmol/L Final  . Potassium 12/20/2019 4.4  3.5 - 5.1 mmol/L Final  . Chloride 12/20/2019 101  98 - 111 mmol/L Final  . CO2 12/20/2019 26  22 - 32 mmol/L Final  . Glucose, Bld 12/20/2019 121* 70 - 99 mg/dL Final  . BUN 12/20/2019 18  6 - 20 mg/dL Final  . Creatinine, Ser 12/20/2019 0.81  0.61 - 1.24 mg/dL Final  . Calcium 12/20/2019 8.8* 8.9 - 10.3 mg/dL Final  . Total Protein 12/20/2019 7.4  6.5 - 8.1 g/dL Final  . Albumin 12/20/2019 3.8  3.5 - 5.0 g/dL Final  .  AST 12/20/2019 18  15 - 41 U/L Final  . ALT 12/20/2019 25  0 - 44 U/L Final  . Alkaline Phosphatase 12/20/2019 92  38 - 126 U/L Final  . Total Bilirubin 12/20/2019 0.2* 0.3 - 1.2 mg/dL Final  . GFR calc non Af Amer 12/20/2019 >60  >60 mL/min Final  . GFR calc Af Amer 12/20/2019 >60  >60 mL/min Final  . Anion gap 12/20/2019 8  5 - 15 Final   Performed at [facility], [address], [place], [place] [address]  . WBC 12/20/2019 16.6* 4.0 - 10.5 K/uL Final  . RBC 12/20/2019 5.39  4.22 - 5.81 MIL/uL Final  . Hemoglobin 12/20/2019 13.5  13.0 - 17.0 g/dL Final  . HCT 12/20/2019 44.4  39 - 52 % Final  . MCV 12/20/2019 82.4  80.0 - 100.0 fL Final  . MCH 12/20/2019 25.0* 26.0 - 34.0 pg Final  . MCHC 12/20/2019 30.4  30.0 - 36.0 g/dL Final  . RDW 12/20/2019 14.6  11.5 - 15.5 % Final  . Platelets 12/20/2019 226  150 - 400 K/uL Final  . nRBC 12/20/2019 0.0  0.0 - 0.2 % Final  . Neutrophils Relative % 12/20/2019 37  % Final  . Neutro Abs 12/20/2019 6.1  1.7 - 7.7 K/uL Final  . Lymphocytes Relative 12/20/2019 56  % Final  . Lymphs Abs 12/20/2019 9.3* 0.7 - 4.0 K/uL Final  . Monocytes Relative 12/20/2019 5  % Final  . Monocytes Absolute 12/20/2019 0.9  0  - 1 K/uL Final  . Eosinophils Relative 12/20/2019 1  % Final  . Eosinophils Absolute 12/20/2019 0.2  0 - 0 K/uL Final  . Basophils Relative 12/20/2019 0  % Final  . Basophils Absolute 12/20/2019 0.0  0 - 0 K/uL Final  . Immature Granulocytes 12/20/2019 1  % Final  . Abs Immature Granulocytes 12/20/2019 0.12* 0.00 - 0.07 K/uL Final   Performed at [facility], [address], [place], [place] [address]    Assessment:  Albert Sanders is a 54 y.o. 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 He was noted to have abdominal adenopathy and mild splenomegaly on CT scan in 02/2015 following a fall. He represented with abdominal pain, fever, and sweats. Abdominal and pelvic CT scanwithout contrast on 07/25/2015 revealed abdominal adenopathy with the largest lymph node in the portacaval region measuring 1.9 cm (stable). There was mild splenomegaly (15 cm).   Flow cytometryon 08/20/2015 revealed a CD5 positive/CD23 positive clonal B-cell population with a CLL/SLL phenotype in 27% of leukocytes, which were CD38 negative. 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  Labs on 08/07/2015 revealed a hematocrit of 43, hemoglobin 13.9, platelets 200,000, white count 13,000 with an ANC of 500. Differential included 50% segs and 42% lymphs with a mild lymphocytosis (5500). Comprehensive metabolic, LDH, uric acid, sedimentation rate, SPEP, and immunoglobulins were normal.  PET scanon 08/07/2015 revealed no hypermetabolic lymphadenopathy or metastatic disease. There was a mildly enlarged gastrohepatic ligament, portacaval, and right external iliac lymph nodes (1.3-1.9 cm). He had a top normal spleen size.   Bone marrowon 09/04/2015 revealed <10% involvement with B-cell lymphoproliferative  neoplasm with a B-cell CLL/SLL immunophenotype. There was mild non-specific dyserythropoiesis and mild increase in reticulin. Storage iron was present. Flow cytometry revealed CD5+ monotypic (clonal) B  cell population (20% of sample) with B-cell CLL/SLL immunophenotype. Cytogenetics were normal (46, XY). FISH studies revealed deletion of TP53gene/17p.  He has had infections. He had a severe flexor tenosynovitis of the right 4th finger in 08/2015 after being bitten by a parrot. He had debridement, antibiotics, and a skin graft. He had an infection overlying his right wrist recently (unroofed by patient).  He has a history of anaphylaxisto contrast dye. Cystoscopy with retrograde pyelogram on 09/16/2015 revealed no abnormalities. Testosteronewas 113 [phone]) on 08/28/2015.  He was [facility] to [facility] from 09/25/2018 - 09/26/2018 with a heroin overdose and probable aspiration pneumonia.  He was seen in the ER for a fever and cough on 06/15/2019.  COVID-19 testing was negative.  The patient received the [place] COVID-19 vaccines on 03/13/2020 and 04/04/2020.  Symptomatically, ***  Plan: 1.   Labs today: CBC with diff, CMP, LDH.  2.   Chronic lymphocytic leukemia             WBC 16,600 (ANC of 6,100; ALC 9300).             Clinically, he continues to do well             Exam on 06/21/2019 revealed no adenopathy or hepatosplenomegaly.             White count has improved.             Patient currently has no indications for treatment.             Close ongoing surveillance unless any interval concerns. 3.   RTC in 6 months for MD assessment and labs (CBC with diff, CMP, LDH).  I discussed the assessment and treatment plan with the patient.  The patient was provided an opportunity to ask questions and all were answered.  The patient agreed with the plan and demonstrated an understanding of the instructions.  The patient was advised to call back if the symptoms worsen or if the condition  fails to improve as anticipated.  I provided *** minutes of face-to-face time during this this encounter and > 50% was spent counseling as documented under my assessment and plan.  Lequita Asal, MD, PhD    06/19/2020, 8:50 AM  I, Mirian Mo Tufford, am acting as a Education administrator for Lequita Asal, MD.  I, [place] Mike Gip, MD, have reviewed the above documentation for accuracy and completeness, and I agree with the above.

## 2020-06-22 ENCOUNTER — Other Ambulatory Visit: Payer: Self-pay

## 2020-06-22 ENCOUNTER — Ambulatory Visit: Payer: Self-pay | Admitting: Hematology and Oncology

## 2021-08-17 ENCOUNTER — Other Ambulatory Visit: Payer: Self-pay

## 2021-08-17 ENCOUNTER — Encounter: Payer: Self-pay | Admitting: Emergency Medicine

## 2021-08-17 ENCOUNTER — Emergency Department
Admission: EM | Admit: 2021-08-17 | Discharge: 2021-08-17 | Disposition: A | Discharge status: Home / Self Care | Payer: Self-pay | Attending: Emergency Medicine | Admitting: Emergency Medicine

## 2021-08-17 DIAGNOSIS — Z5321 Procedure and treatment not carried out due to patient leaving prior to being seen by health care provider: Secondary | ICD-10-CM | POA: Insufficient documentation

## 2021-08-17 DIAGNOSIS — M545 Low back pain, unspecified: Secondary | ICD-10-CM | POA: Insufficient documentation

## 2021-08-17 NOTE — ED Triage Notes (Signed)
Arrives via [facility], c/o lower back pain radiating down right leg. Injured back on Saturday.  VS wnl.

## 2021-08-21 ENCOUNTER — Emergency Department
Admission: EM | Admit: 2021-08-21 | Discharge: 2021-08-21 | Disposition: A | Discharge status: Home / Self Care | Payer: Self-pay | Attending: Emergency Medicine | Admitting: Emergency Medicine

## 2021-08-21 ENCOUNTER — Emergency Department: Payer: Self-pay

## 2021-08-21 ENCOUNTER — Other Ambulatory Visit: Payer: Self-pay

## 2021-08-21 DIAGNOSIS — F17200 Nicotine dependence, unspecified, uncomplicated: Secondary | ICD-10-CM | POA: Insufficient documentation

## 2021-08-21 DIAGNOSIS — W010XXA Fall on same level from slipping, tripping and stumbling without subsequent striking against object, initial encounter: Secondary | ICD-10-CM | POA: Insufficient documentation

## 2021-08-21 DIAGNOSIS — M5441 Lumbago with sciatica, right side: Secondary | ICD-10-CM | POA: Insufficient documentation

## 2021-08-21 MED ORDER — PREDNISONE 10 MG PO TABS: 10.0000 mg | ORAL_TABLET | ORAL | 0 refills | Status: DC

## 2021-08-21 MED ORDER — ONDANSETRON HCL 4 MG/2ML IJ SOLN
4.0000 mg | Freq: Once | INTRAMUSCULAR | Status: AC
  Administered 2021-08-21: 4 mg via INTRAVENOUS
  Filled 2021-08-21: qty 2

## 2021-08-21 MED ORDER — MORPHINE SULFATE (PF) 4 MG/ML IV SOLN
4.0000 mg | Freq: Once | INTRAVENOUS | Status: AC
Start: 2021-08-21 — End: 2021-08-21
  Administered 2021-08-21: 4 mg via INTRAVENOUS
  Filled 2021-08-21: qty 1

## 2021-08-21 MED ORDER — KETOROLAC TROMETHAMINE 30 MG/ML IJ SOLN
30.0000 mg | Freq: Once | INTRAMUSCULAR | Status: AC
  Administered 2021-08-21: 30 mg via INTRAVENOUS
  Filled 2021-08-21: qty 1

## 2021-08-21 MED ORDER — ORPHENADRINE CITRATE 30 MG/ML IJ SOLN
60.0000 mg | Freq: Once | INTRAMUSCULAR | Status: AC
  Administered 2021-08-21: 60 mg via INTRAVENOUS
  Filled 2021-08-21: qty 2

## 2021-08-21 MED ORDER — MELOXICAM 15 MG PO TABS: 15.0000 mg | ORAL_TABLET | Freq: Every day | ORAL | 0 refills | Status: DC

## 2021-08-21 MED ORDER — HYDROMORPHONE HCL 1 MG/ML IJ SOLN
1.0000 mg | Freq: Once | INTRAMUSCULAR | Status: AC
  Administered 2021-08-21: 1 mg via INTRAVENOUS
  Filled 2021-08-21: qty 1

## 2021-08-21 MED ORDER — DEXAMETHASONE SODIUM PHOSPHATE 10 MG/ML IJ SOLN
10.0000 mg | Freq: Once | INTRAMUSCULAR | Status: AC
  Administered 2021-08-21: 10 mg via INTRAVENOUS
  Filled 2021-08-21: qty 1

## 2021-08-21 MED ORDER — METHOCARBAMOL 500 MG PO TABS: 500.0000 mg | ORAL_TABLET | Freq: Four times a day (QID) | ORAL | 0 refills | Status: DC

## 2021-08-21 MED ORDER — HYDROCODONE-ACETAMINOPHEN 5-325 MG PO TABS: 1.0000 | ORAL_TABLET | ORAL | 0 refills | Status: AC | PRN

## 2021-08-21 MED ORDER — SODIUM CHLORIDE 0.9 % IV BOLUS
1000.0000 mL | Freq: Once | INTRAVENOUS | Status: AC
Start: 2021-08-21 — End: 2021-08-21
  Administered 2021-08-21: 1000 mL via INTRAVENOUS

## 2021-08-21 MED ORDER — MORPHINE SULFATE (PF) 4 MG/ML IV SOLN
4.0000 mg | Freq: Once | INTRAVENOUS | Status: AC
  Administered 2021-08-21: 4 mg via INTRAVENOUS
  Filled 2021-08-21: qty 1

## 2021-08-21 NOTE — ED Notes (Signed)
Patient transported to X-ray 

## 2021-08-21 NOTE — ED Triage Notes (Signed)
Pt comes via [facility] from home with c/o back pain following injury last Saturday. Pt states it is not sharp like it was. Pt states 8/10 pain. EMS reports VSS.

## 2021-08-21 NOTE — ED Provider Notes (Signed)
[facility] Emergency Department Provider Note  ____________________________________________  Time seen: Approximately 6:11 PM  I have reviewed the triage vital signs and the nursing notes.   HISTORY  Chief Complaint Back Pain    HPI Albert Sanders is a 55 y.o. male who presents the emergency department complaining of lower back pain with pain radiating down the right leg.  Patient was washing his dog a week ago when he tripped over the dog and landed on his buttocks.  Patient had immediate pain in his lower lumbar spine.  Initially thought he had bruised it but has had progressive symptoms with worsening pain, now he states that he can not stand up right due to the pain.  He states that he if he leans over forward, or lays in a position with his head below his feet he will have some relief of the pain.  However now he is experiencing pain rating down his leg.  He has not had any bowel or bladder dysfunction or saddle anesthesia.  Patient has a history of degenerative disc disease in his lower back and has had a previous cervical spine injury that reportedly did not require surgery.       Past Medical History:  Diagnosis Date   Arthritis    Clavicular fracture    CLL (chronic lymphocytic leukemia) (HCC)    CLL   Complication of anesthesia    woke up during gallbladder surgery-pt states it takes alot of anesthesia to sedate him    Erectile dysfunction    GERD (gastroesophageal reflux disease)    History of kidney stones    Morbid obesity ([place])    Renal disorder    Urinary calculus     Patient Active Problem List   Diagnosis Date Noted   Opiate overdose ([place]) 09/25/2018   Intravenous drug abuse (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   Hyperglycemia 10/07/2016   Prostate cancer screening 10/07/2016   Fatigue 10/07/2016   Snoring 10/07/2016   Erectile dysfunction 10/07/2016   Vitamin D deficiency 10/06/2015   Tenosynovitis 09/27/2015   GERD (gastroesophageal reflux disease) 09/27/2015   Low back pain 09/26/2015   Obesity 09/26/2015   Abnormal urine 09/21/2015   Anxiety about health 09/21/2015   Testosterone deficiency 09/18/2015   Urinary calculus    Clavicular fracture    Flank pain 08/28/2015   Lymphocytosis 08/20/2015   Chronic lymphocytic leukemia ([place]) 08/20/2015   Adenopathy 07/31/2015   Night sweats 07/31/2015   Splenomegaly 07/31/2015    Past Surgical History:  Procedure Laterality Date   ANTERIOR CRUCIATE LIGAMENT REPAIR     BICEPS TENDON REPAIR Right    BONE MARROW BIOPSY  08-2015   CHOLECYSTECTOMY     CYSTOSCOPY W/ RETROGRADES Bilateral 09/16/2015   Procedure: CYSTOSCOPY WITH RETROGRADE PYELOGRAM;  Surgeon: Nickie Retort, MD;  Location: [facility] ORS;  Service: Urology;  Laterality: Bilateral;   HARDWARE REMOVAL Left 01/08/2018   Procedure: HARDWARE REMOVAL- DEEP HARDWARE;  Surgeon: Lovell Sheehan, MD;  Location: [facility] ORS;  Service: Orthopedics;  Laterality: Left;    Prior to Admission medications   Medication Sig Start Date End Date Taking? Authorizing Provider  HYDROcodone-acetaminophen (NORCO/VICODIN) 5-325 MG tablet Take 1 tablet  by mouth every 4 (four) hours as needed for moderate pain. 08/21/21 08/21/22 Yes Edwing Figley, Charline Bills, PA-C  meloxicam (MOBIC) 15 MG tablet Take 1 tablet (15 mg total) by mouth daily. 08/21/21  Yes Tabius Rood, Charline Bills, PA-C  methocarbamol (ROBAXIN) 500 MG tablet Take 1 tablet (500 mg total) by mouth 4 (four) times daily. 08/21/21  Yes Braylie Badami, Charline Bills, PA-C  predniSONE (DELTASONE) 10 MG tablet Take 1 tablet (10 mg total) by mouth as directed. 08/21/21  Yes Dayquan Buys, Charline Bills, PA-C   amoxicillin-clavulanate (AUGMENTIN) 875-125 MG tablet Take 1 tablet by mouth 2 (two) times daily. Patient not taking: Reported on 06/21/2019 09/26/18   Salary, Avel Peace, MD  cephALEXin (KEFLEX) 500 MG capsule Take 1 capsule (500 mg total) by mouth 3 (three) times daily. Patient not taking: Reported on 06/21/2019 06/16/19   Paulette Blanch, MD  ibuprofen (ADVIL,MOTRIN) 200 MG tablet Take 800 mg by mouth 3 (three) times daily as needed for headache or moderate pain.    [provider]  metroNIDAZOLE (FLAGYL) 500 MG tablet Take 1 tablet (500 mg total) by mouth 3 (three) times daily. Patient not taking: Reported on 06/21/2019 09/26/18   Salary, Holly Bodily D, MD  omeprazole (PRILOSEC) 10 MG capsule Take 10 mg by mouth as needed.    [provider]  oxymetazoline (AFRIN) 0.05 % nasal spray Place 1 spray into both nostrils daily as needed for congestion.    [provider]    Allergies Contrast media [iodinated diagnostic agents]  Family History  Problem Relation Age of Onset   Diabetes Paternal Uncle    Cancer Paternal Grandmother        breast   Diabetes Paternal Grandfather    CAD Maternal Grandmother    Heart disease Neg Hx    Hypertension Neg Hx    Stroke Neg Hx    COPD Neg Hx     Social History Social History   Tobacco Use   Smoking status: Never   Smokeless tobacco: Current    Types: Snuff  Vaping Use   Vaping Use: Never used  Substance Use Topics   Alcohol use: Not Currently    Comment: rare   Drug use: Not Currently    Types: IV, Heroin    Comment: about a gram a day     Review of Systems  Constitutional: No fever/chills Eyes: No visual changes. No discharge ENT: No upper respiratory complaints. Cardiovascular: no chest pain. Respiratory: no cough. No SOB. Gastrointestinal: No abdominal pain.  No nausea, no vomiting.  No diarrhea.  No constipation. Genitourinary: Negative for dysuria. No hematuria Musculoskeletal: Sharp back pain radiating  down the right leg.  Cannot stand at this time. Skin: Negative for rash, abrasions, lacerations, ecchymosis. Neurological: Negative for headaches, focal weakness or numbness.  10 System ROS otherwise negative.  ____________________________________________   PHYSICAL EXAM:  VITAL SIGNS: ED Triage Vitals  Enc Vitals Group     BP 08/21/21 1336 (!) 158/83     Pulse Rate 08/21/21 1336 87     Resp 08/21/21 1336 20     Temp 08/21/21 1336 (!) 97.5 F (36.4 C)     Temp Source 08/21/21 1336 Oral     SpO2 08/21/21 1336 96 %     Weight --      Height --      Head Circumference --      Peak Flow --      Pain Score 08/21/21 1331 8     Pain  Loc --      Pain Edu? --      Excl. in [place]? --      Constitutional: Alert and oriented. Well appearing and in no acute distress. Eyes: Conjunctivae are normal. PERRL. EOMI. Head: Atraumatic. ENT:      Ears:       Nose: No congestion/rhinnorhea.      Mouth/Throat: Mucous membranes are moist.  Neck: No stridor.    Cardiovascular: Normal rate, regular rhythm. Normal S1 and S2.  Good peripheral circulation. Respiratory: Normal respiratory effort without tachypnea or retractions. Lungs CTAB. Good air entry to the bases with no decreased or absent breath sounds. Gastrointestinal: Bowel sounds 4 quadrants. Soft and nontender to palpation. No guarding or rigidity. No palpable masses. No distention. No CVA tenderness. Musculoskeletal: Full range of motion to all extremities. No gross deformities appreciated.  Visualization of the lower back reveals no visible signs of trauma.  Patient has an over-the-counter back brace on at this time.  Patient is tender midline over the L3-L5 region.  No palpable abnormality or step-off.  No extension into the SI joint but patient is tender over the right-sided sciatic notch.  Positive straight leg raise to the right side.  Patient with good dorsalis pedis pulse bilaterally, sensation intact bilaterally. Neurologic:  Normal  speech and language. No gross focal neurologic deficits are appreciated.  Skin:  Skin is warm, dry and intact. No rash noted. Psychiatric: Mood and affect are normal. Speech and behavior are normal. Patient exhibits appropriate insight and judgement.   ____________________________________________   LABS (all labs ordered are listed, but only abnormal results are displayed)  Labs Reviewed - No data to display ____________________________________________  EKG   ____________________________________________  RADIOLOGY I personally viewed and evaluated these images as part of my medical decision making, as well as reviewing the written report by the radiologist.  ED Provider Interpretation: X-ray revealed no acute findings.  MRI reveals multiple chronic changes with bulging disc.  There was no acute spinal injury such as fracture, ruptured disc.  Nonspecific STIR signal with recommendation for close follow-up.  DG Lumbar Spine 2-3 Views  Result Date: 08/21/2021 CLINICAL DATA:  Fall, worsening back pain. EXAM: LUMBAR SPINE - 2-3 VIEW COMPARISON:  CT lumbar spine 03/03/2015. FINDINGS: Five non-rib-bearing lumbar vertebral bodies. Multilevel degenerative changes. There is no evidence of lumbar spine fracture. Persistent trace grade 1 anterolisthesis with bilateral L5 pars articularis defects. Right upper quadrant surgical clips. IMPRESSION: No acute displaced fracture or traumatic listhesis of the lumbar spine. Electronically Signed   By: Iven Finn M.D.   On: 08/21/2021 19:31   MR LUMBAR SPINE WO CONTRAST  Result Date: 08/21/2021 CLINICAL DATA:  Initial evaluation for low back pain status post recent fall. EXAM: MRI LUMBAR SPINE WITHOUT CONTRAST TECHNIQUE: Multiplanar, multisequence MR imaging of the lumbar spine was performed. No intravenous contrast was administered. COMPARISON:  Radiograph from earlier the same day. FINDINGS: Segmentation: Standard. Lowest well-formed disc space labeled  the L5-S1 level. Alignment: Trace retrolisthesis of L1 on L2 and L2 on L3, with trace anterolisthesis of L5 on S1. Findings presumably chronic and degenerative. Vertebrae: Vertebral body height maintained without acute or chronic fracture. Underlying bone marrow signal intensity within normal limits. Few small benign hemangiomata noted within the L2 and L3 vertebral bodies. There is vague T1 hypointense, stir hyperintense signal intensity involving the anterior aspect of the L1 vertebral body (series 7, image 6), indeterminate. No appreciable associated osseous expansion or extraosseous extension. Reactive marrow edema  about the L4-5 interspace is noted, likely degenerative in nature. Conus medullaris and cauda equina: Conus extends to the L1 level. Conus and cauda equina appear normal. Paraspinal and other soft tissues: Paraspinous soft tissues demonstrate no acute finding. Few small subcentimeter benign appearing cyst noted within the visualized right kidney. Visualized visceral structures otherwise unremarkable. Disc levels: T12-L1: Anterior reactive endplate spurring without significant disc bulge. No stenosis. L1-2: Trace retrolisthesis. Diffuse disc bulge with disc desiccation. Right subarticular annular fissure. Prominent anterior endplate spurring. Mild facet hypertrophy. No significant spinal stenosis. Foramina remain patent. L2-3: Diffuse disc bulge with disc desiccation. Disc bulging slightly eccentric to the left. Multiple scattered annular fissures noted. Moderate left with mild right facet hypertrophy. Resultant mild narrowing of the left lateral recess. Central canal remains patent. Foramina remain patent as well. L3-4: Diffuse disc bulge with disc desiccation, asymmetric to the left. Associated reactive endplate spurring. Moderate left worse than right facet arthrosis with associated small joint effusions. Resultant severe spinal stenosis with bilateral lateral recess narrowing. Moderate left L3  foraminal stenosis. Right neural foramina remains patent L4-5: Degenerative intervertebral disc space narrowing with diffuse disc bulge and disc desiccation, asymmetric to the left. Prominent left-sided reactive endplate spurring with associated reactive marrow edema. Moderate bilateral facet hypertrophy. Superimposed broad-based central disc protrusion with slight inferior angulation. Note made of a 6 mm cystic lesion within the right ventral epidural space (series 5, image 7), nonspecific, but could reflect a small peridiscal cyst, extruded and sequestered disc fragment, or possibly synovial cyst. Moderate bilateral facet arthrosis. Mild prominence of the epidural fat resultant moderate to severe canal with bilateral subarticular stenosis. Mild to moderate bilateral L4 foraminal narrowing. L5-S1: Trace anterolisthesis. Disc desiccation with mild disc bulge. Mild facet hypertrophy. Epidural lipomatosis. 1.3 cm cystic lesion adjacent to the descending right S1 nerve root likely reflects a small perineural cyst. No significant spinal stenosis. Mild to moderate bilateral L5 foraminal narrowing. IMPRESSION: 1. No acute traumatic injury within the lumbar spine status post recent fall. 2. Multifactorial degenerative changes at L3-4 with resultant severe spinal stenosis, with moderate left L3 foraminal narrowing. 3. Multifactorial degenerative changes at L4-5 with resultant moderate to severe canal and bilateral subarticular stenosis, with mild to moderate bilateral L4 foraminal narrowing. Discogenic reactive endplate marrow edema at this level could contribute to lower back pain. 4. Vague T1 hypointense, STIR hyperintense signal intensity involving the anterior aspect of the L1 vertebral body, nonspecific. While this finding could be degenerative in nature, a possible neoplastic process is difficult to exclude. A short interval follow-up MRI, with and without contrast, is suggested for further characterization.  Electronically Signed   By: Jeannine Boga M.D.   On: 08/21/2021 21:39    ____________________________________________    PROCEDURES  Procedure(s) performed:    Procedures    Medications  morphine 4 MG/ML injection 4 mg (4 mg Intravenous Given 08/21/21 1839)  orphenadrine (NORFLEX) injection 60 mg (60 mg Intravenous Given 08/21/21 1839)  ondansetron (ZOFRAN) injection 4 mg (4 mg Intravenous Given 08/21/21 1840)  sodium chloride 0.9 % bolus 1,000 mL (0 mLs Intravenous Stopped 08/21/21 2218)  morphine 4 MG/ML injection 4 mg (4 mg Intravenous Given 08/21/21 2020)  HYDROmorphone (DILAUDID) injection 1 mg (1 mg Intravenous Given 08/21/21 2221)  ketorolac (TORADOL) 30 MG/ML injection 30 mg (30 mg Intravenous Given 08/21/21 2326)  dexamethasone (DECADRON) injection 10 mg (10 mg Intravenous Given 08/21/21 2326)     ____________________________________________   INITIAL IMPRESSION / ASSESSMENT AND PLAN / ED COURSE  Pertinent labs & imaging results that were available during my care of the patient were reviewed by me and considered in my medical decision making (see chart for details).  Review of the [place] CSRS was performed in accordance of the [place] prior to dispensing any controlled drugs.           Patient's diagnosis is consistent with acute low back pain with sciatica.  Patient presents emergency department after mechanical fall last week.  He has had progressively worsening lower back pain with right-sided sciatica at this time.  States that he has a problem with his lower back with disc issues.  Given the acute pain and the patient was in patient had imaging performed.  There was no concerning neuro deficits of urinary retention or loss of bowel or bladder control.  There was no saddle anesthesia or true paresthesias.  However given the mechanism of the injury I was concerned for ruptured disc.  It appears the patient has chronic changes throughout the lumbar spine with multiple  areas of stenosis.  There is 1 area of concern that is recommended to follow-up with close MRI for further evaluation.  I discussed the results with the patient.  Again with no concerning neuro deficits I do not feel that the patient warrants emergent neurosurgery intervention.  Patient will be referred to neurosurgery for follow-up.  Concerning signs and symptoms are discussed with the patient to return to the emergency department for.  He verbalizes understanding of same.  Patient will have prednisone, meloxicam, very short course of Norco, muscle relaxer for symptom control.  He will follow-up with neurosurgery..  Patient is given ED precautions to return to the ED for any worsening or new symptoms.     ____________________________________________  FINAL CLINICAL IMPRESSION(S) / ED DIAGNOSES  Final diagnoses:  Acute midline low back pain with right-sided sciatica      NEW MEDICATIONS STARTED DURING THIS VISIT:  ED Discharge Orders          Ordered    predniSONE (DELTASONE) 10 MG tablet  As directed       Note to Pharmacy: Take on a pattern of 6, 6, 5, 5, 4, 4, 3, 3, 2, 2, 1, 1   08/21/21 2307    meloxicam (MOBIC) 15 MG tablet  Daily        08/21/21 2307    methocarbamol (ROBAXIN) 500 MG tablet  4 times daily        08/21/21 2307    HYDROcodone-acetaminophen (NORCO/VICODIN) 5-325 MG tablet  Every 4 hours PRN        08/21/21 2307                This chart was dictated using voice recognition software/Dragon. Despite best efforts to proofread, errors can occur which can change the meaning. Any change was purely unintentional.    Darletta Moll, PA-C 08/21/21 2329    Carrie Mew, MD 08/22/21 [phone]

## 2021-08-22 ENCOUNTER — Telehealth: Payer: Self-pay | Admitting: Emergency Medicine

## 2021-08-22 MED ORDER — PREDNISONE 10 MG PO TABS
10.0000 mg | ORAL_TABLET | ORAL | 0 refills | Status: DC
Start: 2021-08-22 — End: 2023-08-17

## 2021-08-22 MED ORDER — MELOXICAM 15 MG PO TABS: 15.0000 mg | ORAL_TABLET | Freq: Every day | ORAL | 0 refills | Status: DC

## 2021-08-22 MED ORDER — METHOCARBAMOL 500 MG PO TABS: 500.0000 mg | ORAL_TABLET | Freq: Four times a day (QID) | ORAL | 0 refills | Status: DC

## 2021-08-22 NOTE — Telephone Encounter (Cosign Needed)
Patient called back to the emergency department and stated that his pharmacy was closed today.  I have represcribed the prednisone, meloxicam, Robaxin to a different pharmacy for the patient.  However as he also has a prescription for controlled substance, this cannot be altered at this time.  Patient will have to wait till tomorrow to fill this prescription at his original pharmacy.

## 2021-09-07 ENCOUNTER — Other Ambulatory Visit: Payer: Self-pay | Admitting: Neurosurgery

## 2021-09-07 ENCOUNTER — Other Ambulatory Visit (HOSPITAL_BASED_OUTPATIENT_CLINIC_OR_DEPARTMENT_OTHER): Payer: Self-pay | Admitting: Neurosurgery

## 2021-09-07 DIAGNOSIS — M48061 Spinal stenosis, lumbar region without neurogenic claudication: Secondary | ICD-10-CM

## 2021-09-07 DIAGNOSIS — M5136 Other intervertebral disc degeneration, lumbar region: Secondary | ICD-10-CM

## 2021-09-07 DIAGNOSIS — M5416 Radiculopathy, lumbar region: Secondary | ICD-10-CM

## 2021-09-08 ENCOUNTER — Telehealth: Payer: Self-pay | Admitting: Oncology

## 2021-09-08 NOTE — Telephone Encounter (Signed)
Received a referral for patient to be seen (previously saw Dr. Mike Gip). Appointment scheduled, requested patient to return phone call to confirm day, time and location of [facility].

## 2021-09-09 ENCOUNTER — Other Ambulatory Visit: Payer: Self-pay

## 2021-09-09 DIAGNOSIS — C9110 Chronic lymphocytic leukemia of B-cell type not having achieved remission: Secondary | ICD-10-CM

## 2021-09-14 ENCOUNTER — Other Ambulatory Visit: Payer: Self-pay

## 2021-09-15 ENCOUNTER — Inpatient Hospital Stay: Payer: Self-pay | Admitting: Oncology

## 2021-09-21 ENCOUNTER — Ambulatory Visit: Payer: Self-pay | Admitting: Oncology

## 2021-09-21 ENCOUNTER — Telehealth: Payer: Self-pay | Admitting: Oncology

## 2021-09-21 NOTE — Telephone Encounter (Signed)
Looks like he was scheduled to see Dr. Janese Banks in Oct but appt was cancelled. Did he specify Dr. Tasia Catchings?

## 2021-09-21 NOTE — Telephone Encounter (Signed)
Spoke to patient and he requested to Dr. Janese Banks after his follow-up MRI ordered by the ED so that results will be ready.

## 2021-09-24 ENCOUNTER — Ambulatory Visit: Payer: Self-pay | Admitting: *Deleted

## 2021-09-24 NOTE — Telephone Encounter (Signed)
Reason for Disposition  [1] Coughed up blood AND [2] > 1 tablespoon (15 ml)  (Exception: Blood-tinged sputum.)  Answer Assessment - Initial Assessment Questions 1. ONSET: "When did the cough begin?"      1 month 2. SEVERITY: "How bad is the cough today?" "Did the blood appear after a coughing spell?"      Coughing spells- worse at night, yes-appeared after coughing spell 3. SPUTUM: "Describe the color of your sputum" (none, dry cough; clear, white, yellow, green)     Red- this morning, back to dark green 4. HEMOPTYSIS: "How much blood?" (flecks, streaks, tablespoons, etc.)     Red sputum/mucus 5. DIFFICULTY BREATHING: "Are you having difficulty breathing?" If Yes, ask: "How bad is it?" (e.g., mild, moderate, severe)    - MILD: No SOB at rest, mild SOB with walking, speaks normally in sentences, can lie down, no retractions, pulse < 100.    - MODERATE: SOB at rest, SOB with minimal exertion and prefers to sit, cannot lie down flat, speaks in phrases, mild retractions, audible wheezing, pulse 100-120.    - SEVERE: Very SOB at rest, speaks in single words, struggling to breathe, sitting hunched forward, retractions, pulse > 120      Wheezing at times 6. FEVER: "Do you have a fever?" If Yes, ask: "What is your temperature, how was it measured, and when did it start?"     no 7. CARDIAC HISTORY: "Do you have any history of heart disease?" (e.g., heart attack, congestive heart failure)      no 8. LUNG HISTORY: "Do you have any history of lung disease?"  (e.g., pulmonary embolus, asthma, emphysema)     no 9. PE RISK FACTORS: "Do you have a history of blood clots?" (or: recent major surgery, recent prolonged travel, bedridden)     no 10. OTHER SYMPTOMS: "Do you have any other symptoms?" (e.g., runny nose, wheezing, chest pain)       no 11. PREGNANCY: "Is there any chance you are pregnant?" "When was your last menstrual period?"       na 12. TRAVEL: "Have you traveled out of the country in the  last month?" (e.g., travel history, exposures)       no  Protocols used: Coughing Up Blood-A-AH

## 2021-09-24 NOTE — Telephone Encounter (Signed)
Patient is calling to report he has had cough/congestion for 1 month. Patient states sputum is green/dark and he has blood colored sputum at times today. Patient is past 3 year mark for office appointment- scheduled NP appointment for future and advised patient to go to [facility] for evaluation.

## 2021-09-30 ENCOUNTER — Telehealth: Payer: Self-pay | Admitting: Oncology

## 2021-09-30 ENCOUNTER — Ambulatory Visit: Admission: RE | Admit: 2021-09-30 | Payer: Self-pay | Source: Ambulatory Visit

## 2021-09-30 ENCOUNTER — Inpatient Hospital Stay: Payer: Self-pay

## 2021-09-30 NOTE — Telephone Encounter (Signed)
Push out by 2-3 weeks

## 2021-09-30 NOTE — Telephone Encounter (Signed)
Pt called to inform us that he is in hospital and needs to cancel appt. Call him at [phone] to reschedule.

## 2021-09-30 NOTE — Telephone Encounter (Signed)
Dellis Filbert, rao pt.

## 2021-10-01 ENCOUNTER — Telehealth: Payer: Self-pay | Admitting: Oncology

## 2021-10-01 NOTE — Telephone Encounter (Signed)
Attempt made to reach patient to confirm future appointments. No answer and the mailbox is full. Will send updated AVS in the mail.

## 2021-10-05 ENCOUNTER — Inpatient Hospital Stay: Payer: Self-pay | Admitting: Oncology

## 2021-10-25 ENCOUNTER — Inpatient Hospital Stay: Payer: Self-pay | Attending: Oncology

## 2021-10-25 ENCOUNTER — Inpatient Hospital Stay: Payer: Self-pay | Admitting: Oncology

## 2021-12-09 ENCOUNTER — Ambulatory Visit: Payer: Self-pay | Admitting: Internal Medicine

## 2022-03-07 IMAGING — MR MR LUMBAR SPINE W/O CM
4 of 5 series · 30 of 48 positions shown · non-contrast
Comparison: Radiograph from earlier the same day.

CLINICAL DATA: Initial evaluation for low back pain status post
recent fall.

EXAM:
MRI LUMBAR SPINE WITHOUT CONTRAST
TECHNIQUE: Multiplanar, multisequence MR imaging of the lumbar spine was
performed. No intravenous contrast was administered.

[Series 5: T2 · sagittal · 4.0mm · 0.81mm/px · 6 of 17 slices shown (1 of 2)]
[im 1/17]
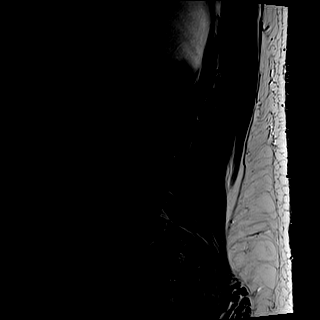
[im 4/17]
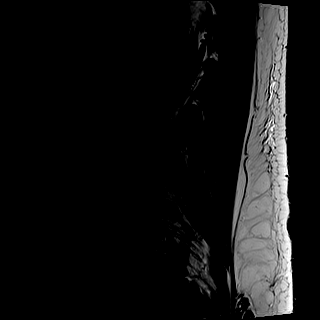
[im 7/17]
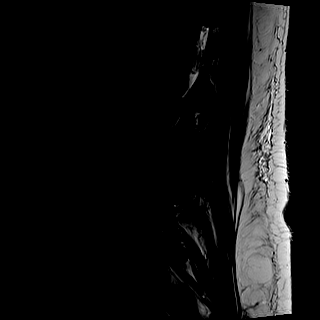
[im 10/17]
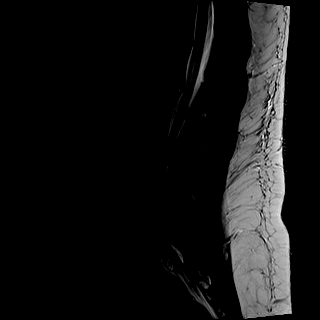
[im 13/17]
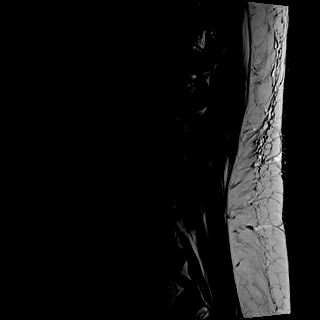
[im 17/17]
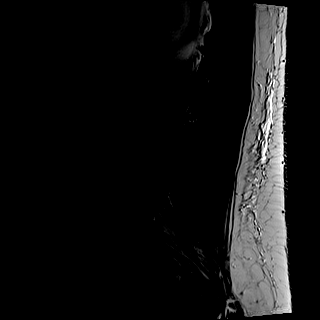

[Series 6: T1 · sagittal · 4.0mm · 0.81mm/px · 6 of 17 slices shown (1 of 2)]
[im 1/17]
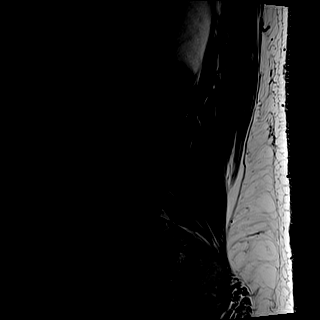
[im 4/17]
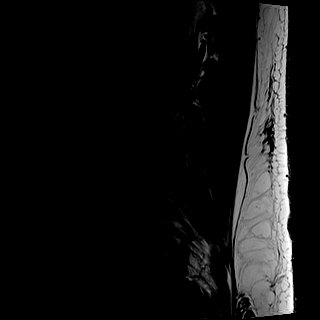
[im 7/17]
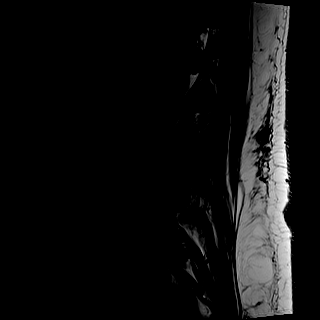
[im 10/17]
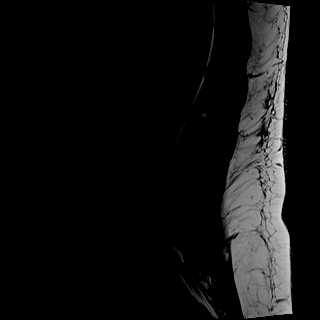
[im 13/17]
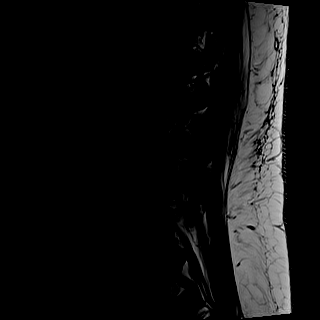
[im 17/17]
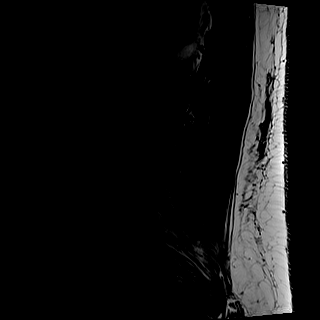

[Series 8: T2 · axial · 4.0mm · 0.78mm/px · z∈[-31,+207]mm · 9 of 40 slices shown (2 of 2)]
[im 1/40]
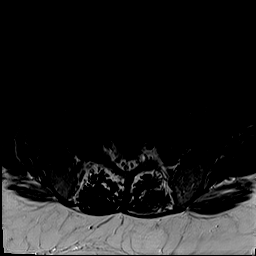
[im 6/40]
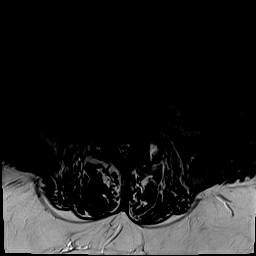
[im 12/40]
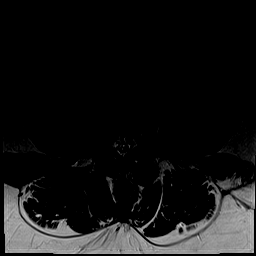
[im 17/40]
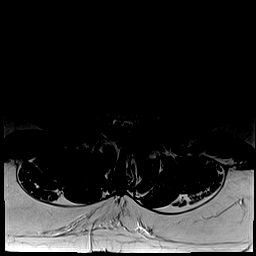
[im 20/40]
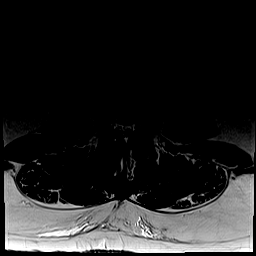
[im 23/40]
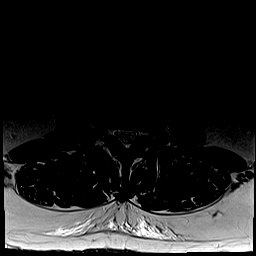
[im 28/40]
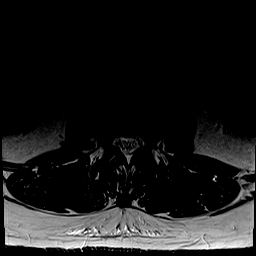
[im 34/40]
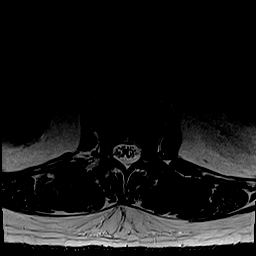
[im 40/40]
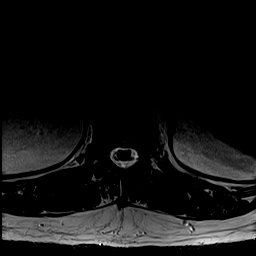

[Series 9: T1 · axial · 4.0mm · 0.39mm/px · z∈[-31,+207]mm · 9 of 40 slices shown (2 of 2)]
[im 1/40]
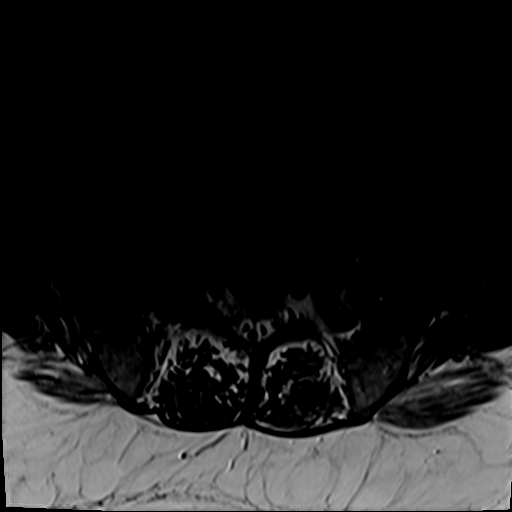
[im 6/40]
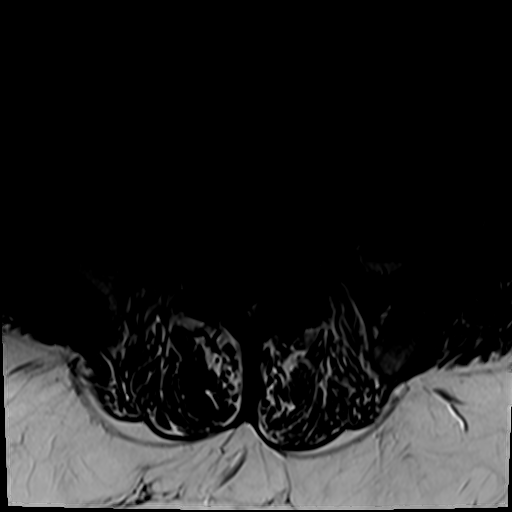
[im 12/40]
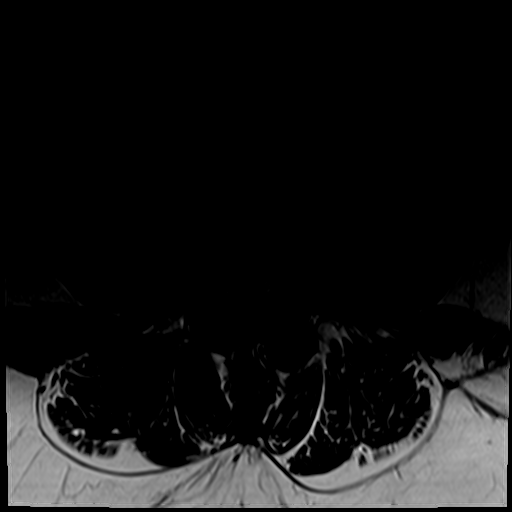
[im 17/40]
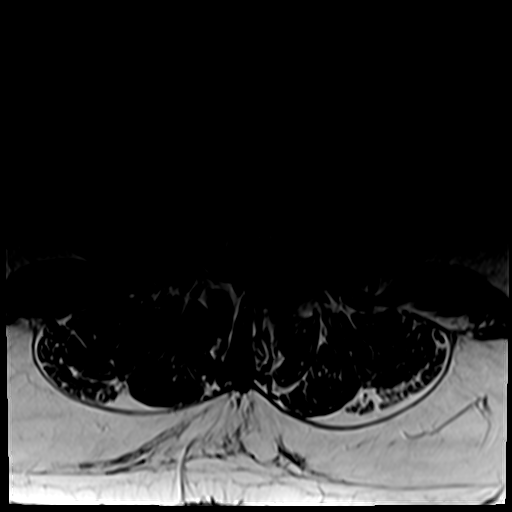
[im 20/40]
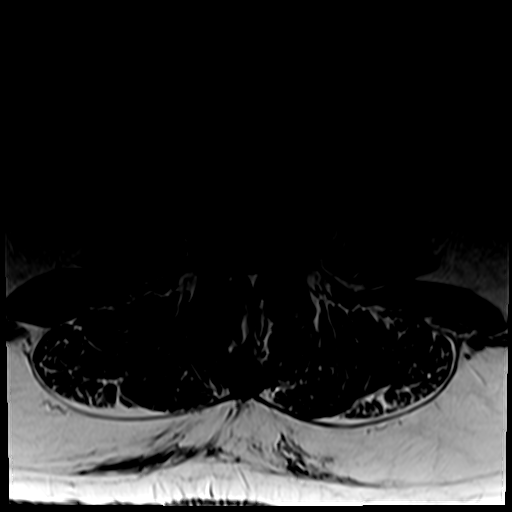
[im 23/40]
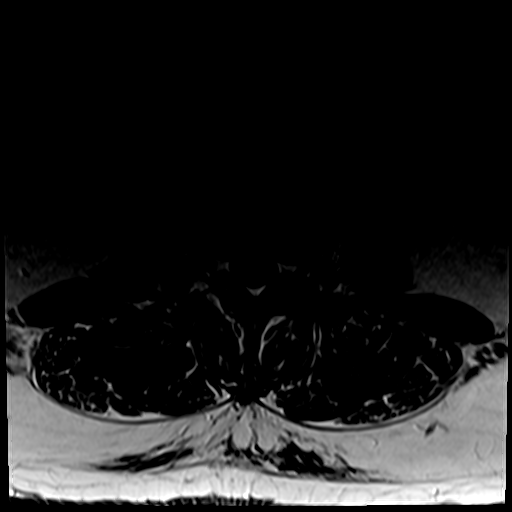
[im 28/40]
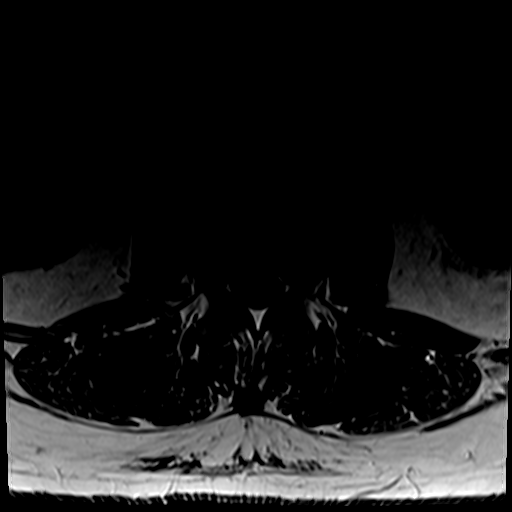
[im 34/40]
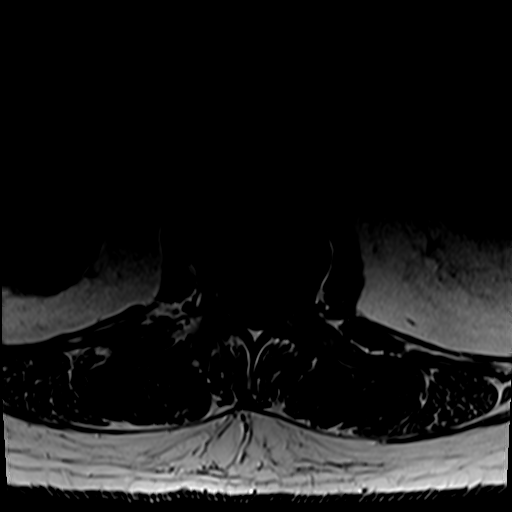
[im 40/40]
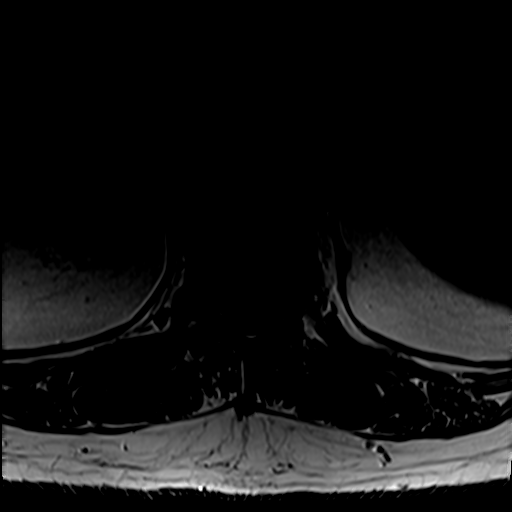

[30 of 48 positions shown; findings below may reference images not displayed]

FINDINGS: Segmentation: Standard. Lowest well-formed disc space labeled the
L5-S1 level.

Alignment: Trace retrolisthesis of L1 on L2 and L2 on L3, with trace
anterolisthesis of L5 on S1. Findings presumably chronic and
degenerative.

Vertebrae: Vertebral body height maintained without acute or chronic
fracture. Underlying bone marrow signal intensity within normal
limits. Few small benign hemangiomata noted within the L2 and L3
vertebral bodies. There is vague T1 hypointense, stir hyperintense
signal intensity involving the anterior aspect of the L1 vertebral
body (series 7, image 6), indeterminate. No appreciable associated
osseous expansion or extraosseous extension. Reactive marrow edema
about the L4-5 interspace is noted, likely degenerative in nature.

Conus medullaris and cauda equina: Conus extends to the L1 level.
Conus and cauda equina appear normal.

Paraspinal and other soft tissues: Paraspinous soft tissues
demonstrate no acute finding. Few small subcentimeter benign
appearing cyst noted within the visualized right kidney. Visualized
visceral structures otherwise unremarkable.

Disc levels:

T12-L1: Anterior reactive endplate spurring without significant disc
bulge. No stenosis.

L1-2: Trace retrolisthesis. Diffuse disc bulge with disc
desiccation. Right subarticular annular fissure. Prominent anterior
endplate spurring. Mild facet hypertrophy. No significant spinal
stenosis. Foramina remain patent.

L2-3: Diffuse disc bulge with disc desiccation. Disc bulging
slightly eccentric to the left. Multiple scattered annular fissures
noted. Moderate left with mild right facet hypertrophy. Resultant
mild narrowing of the left lateral recess. Central canal remains
patent. Foramina remain patent as well.

L3-4: Diffuse disc bulge with disc desiccation, asymmetric to the
left. Associated reactive endplate spurring. Moderate left worse
than right facet arthrosis with associated small joint effusions.
Resultant severe spinal stenosis with bilateral lateral recess
narrowing. Moderate left L3 foraminal stenosis. Right neural
foramina remains patent

L4-5: Degenerative intervertebral disc space narrowing with diffuse
disc bulge and disc desiccation, asymmetric to the left. Prominent
left-sided reactive endplate spurring with associated reactive
marrow edema. Moderate bilateral facet hypertrophy. Superimposed
broad-based central disc protrusion with slight inferior angulation.
Note made of a 6 mm cystic lesion within the right ventral epidural
space (series 5, image 7), nonspecific, but could reflect a small
peridiscal cyst, extruded and sequestered disc fragment, or possibly
synovial cyst. Moderate bilateral facet arthrosis. Mild prominence
of the epidural fat resultant moderate to severe canal with
bilateral subarticular stenosis. Mild to moderate bilateral L4
foraminal narrowing.

L5-S1: Trace anterolisthesis. Disc desiccation with mild disc bulge.
Mild facet hypertrophy. Epidural lipomatosis. 1.3 cm cystic lesion
adjacent to the descending right S1 nerve root likely reflects a
small perineural cyst. No significant spinal stenosis. Mild to
moderate bilateral L5 foraminal narrowing.
IMPRESSION: 1. No acute traumatic injury within the lumbar spine status post
recent fall.
2. Multifactorial degenerative changes at L3-4 with resultant severe
spinal stenosis, with moderate left L3 foraminal narrowing.
3. Multifactorial degenerative changes at L4-5 with resultant
moderate to severe canal and bilateral subarticular stenosis, with
mild to moderate bilateral L4 foraminal narrowing. Discogenic
reactive endplate marrow edema at this level could contribute to
lower back pain.
4. Vague T1 hypointense, STIR hyperintense signal intensity
involving the anterior aspect of the L1 vertebral body, nonspecific.
While this finding could be degenerative in nature, a possible
neoplastic process is difficult to exclude. A short interval
follow-up MRI, with and without contrast, is suggested for further
characterization.

## 2022-03-07 IMAGING — CR DG LUMBAR SPINE 2-3V
3 series · 3 of 3 positions shown · non-contrast
Comparison: CT lumbar spine 03/03/2015.

CLINICAL DATA: Fall, worsening back pain.

EXAM:
LUMBAR SPINE - 2-3 VIEW

[l-spine ap]
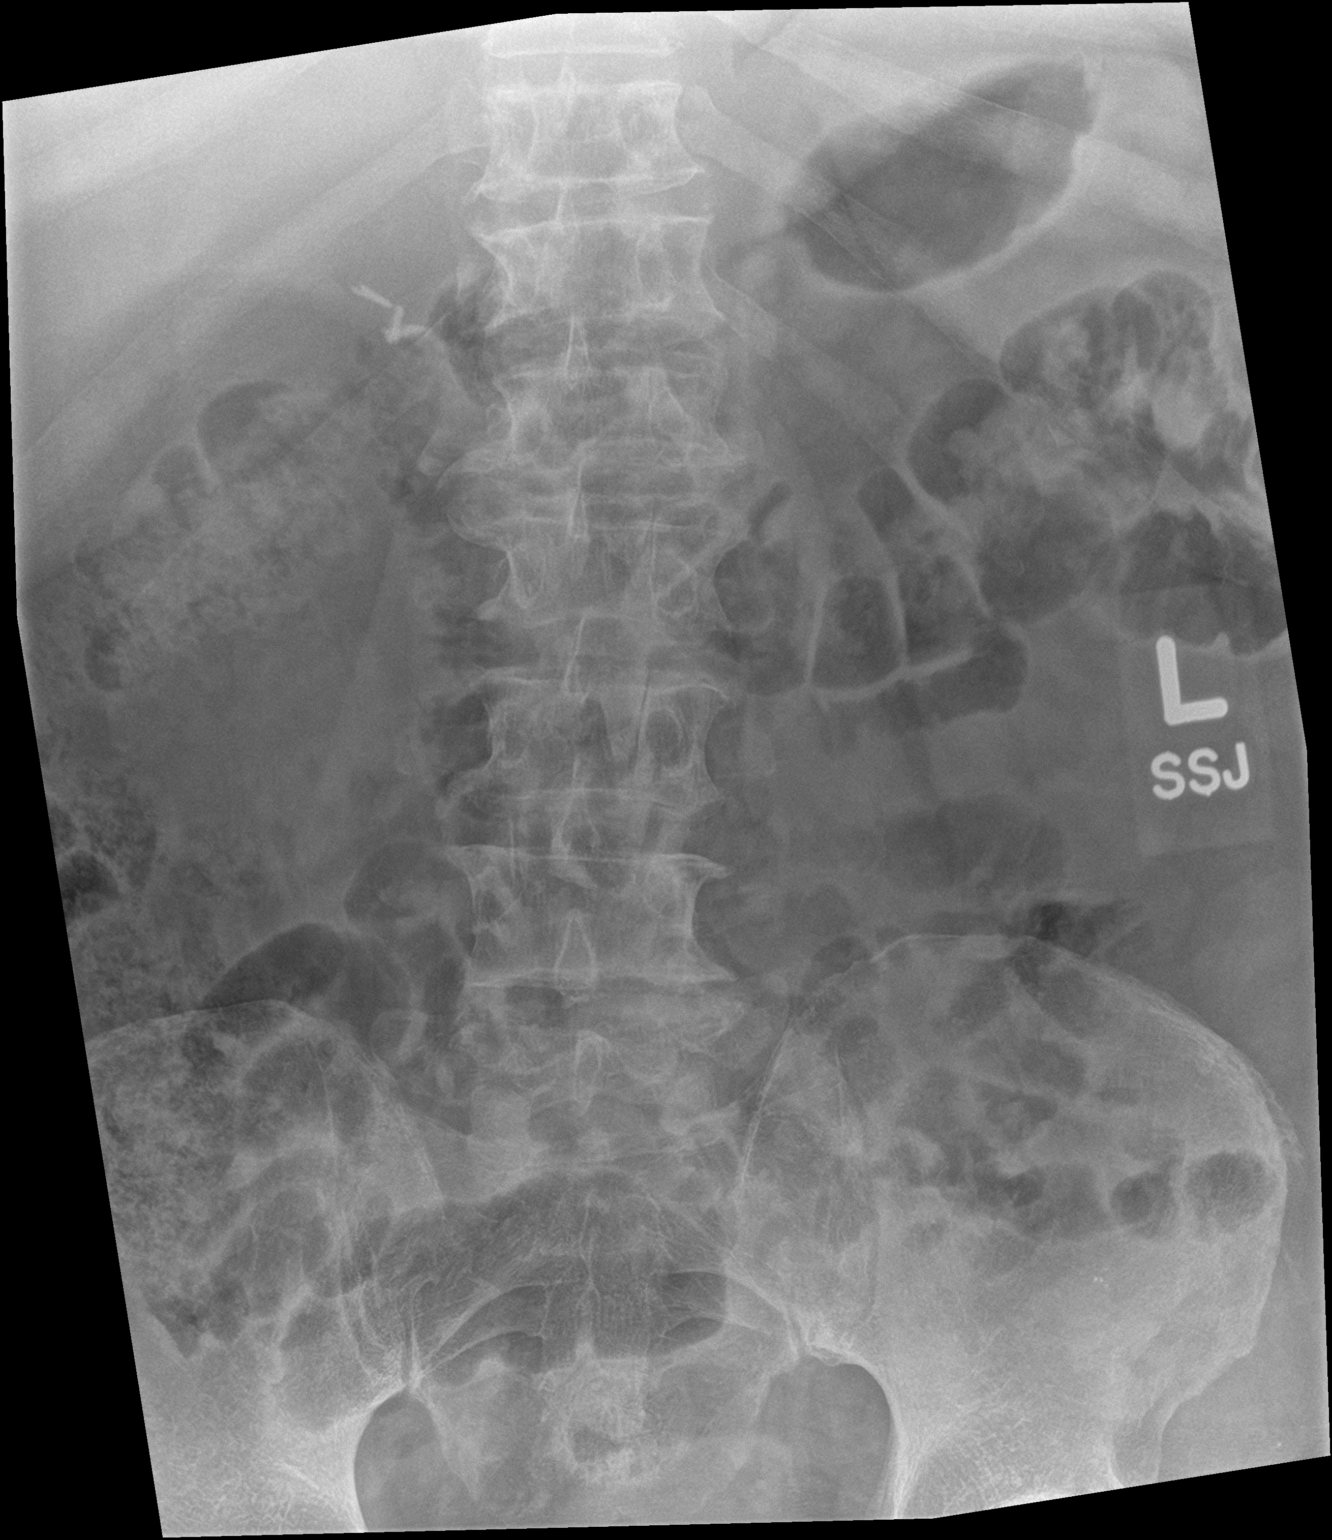

[l-spine lat]
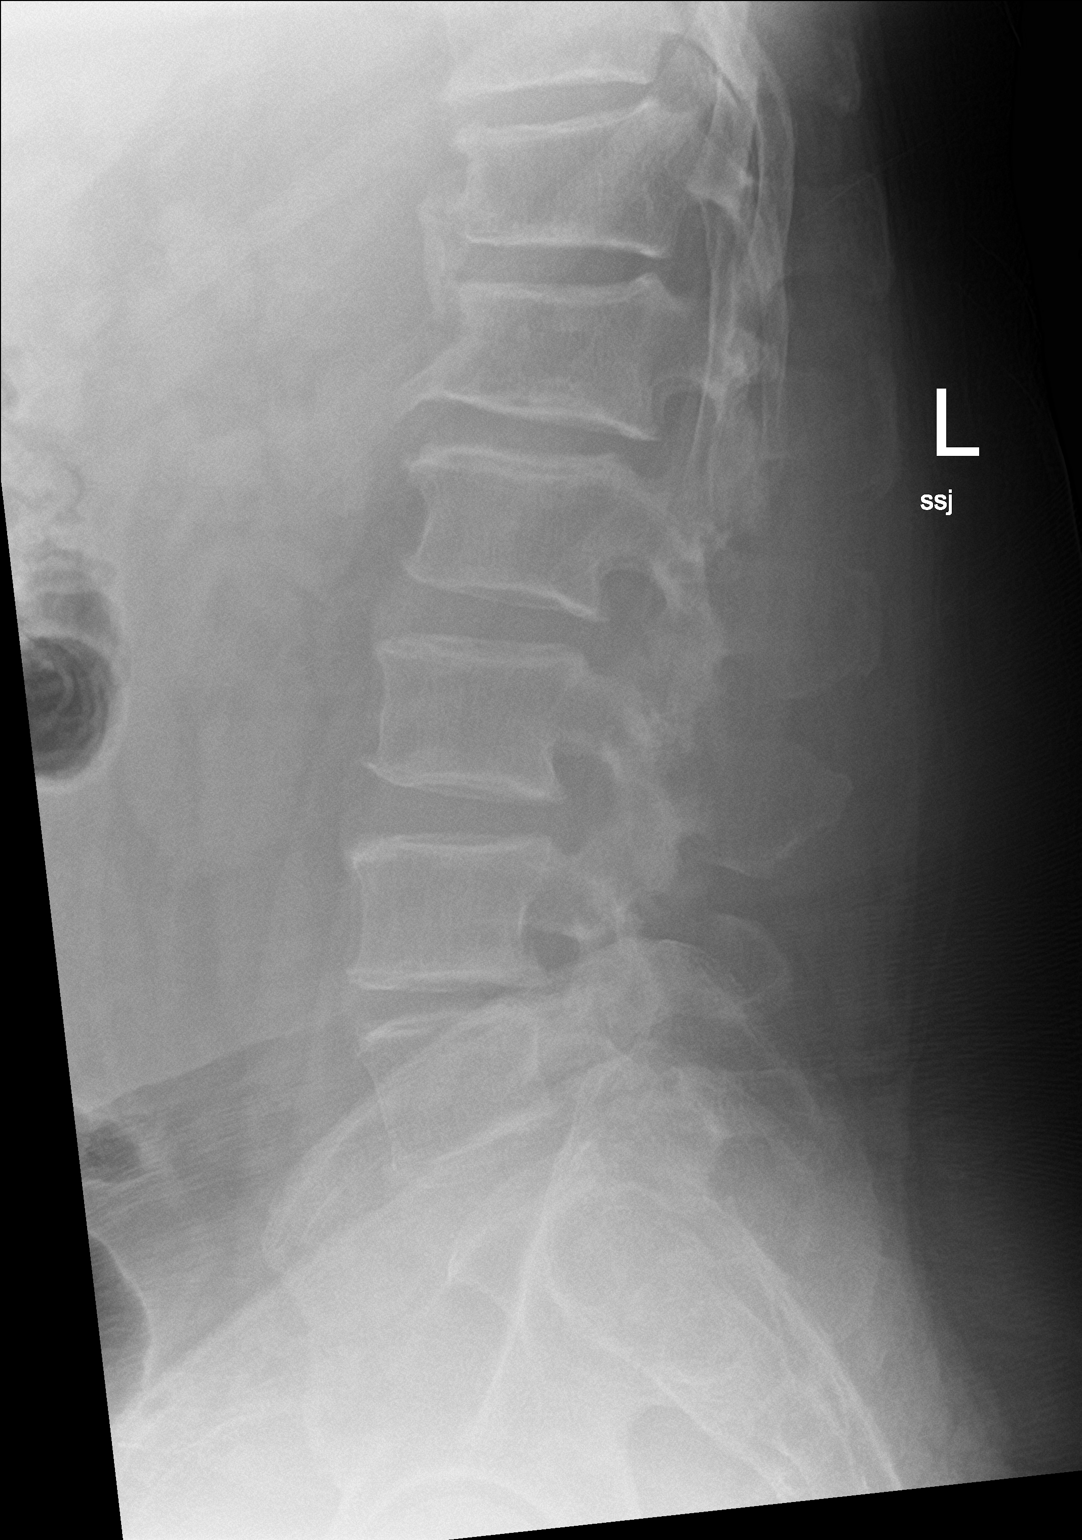

[l-spine spot]
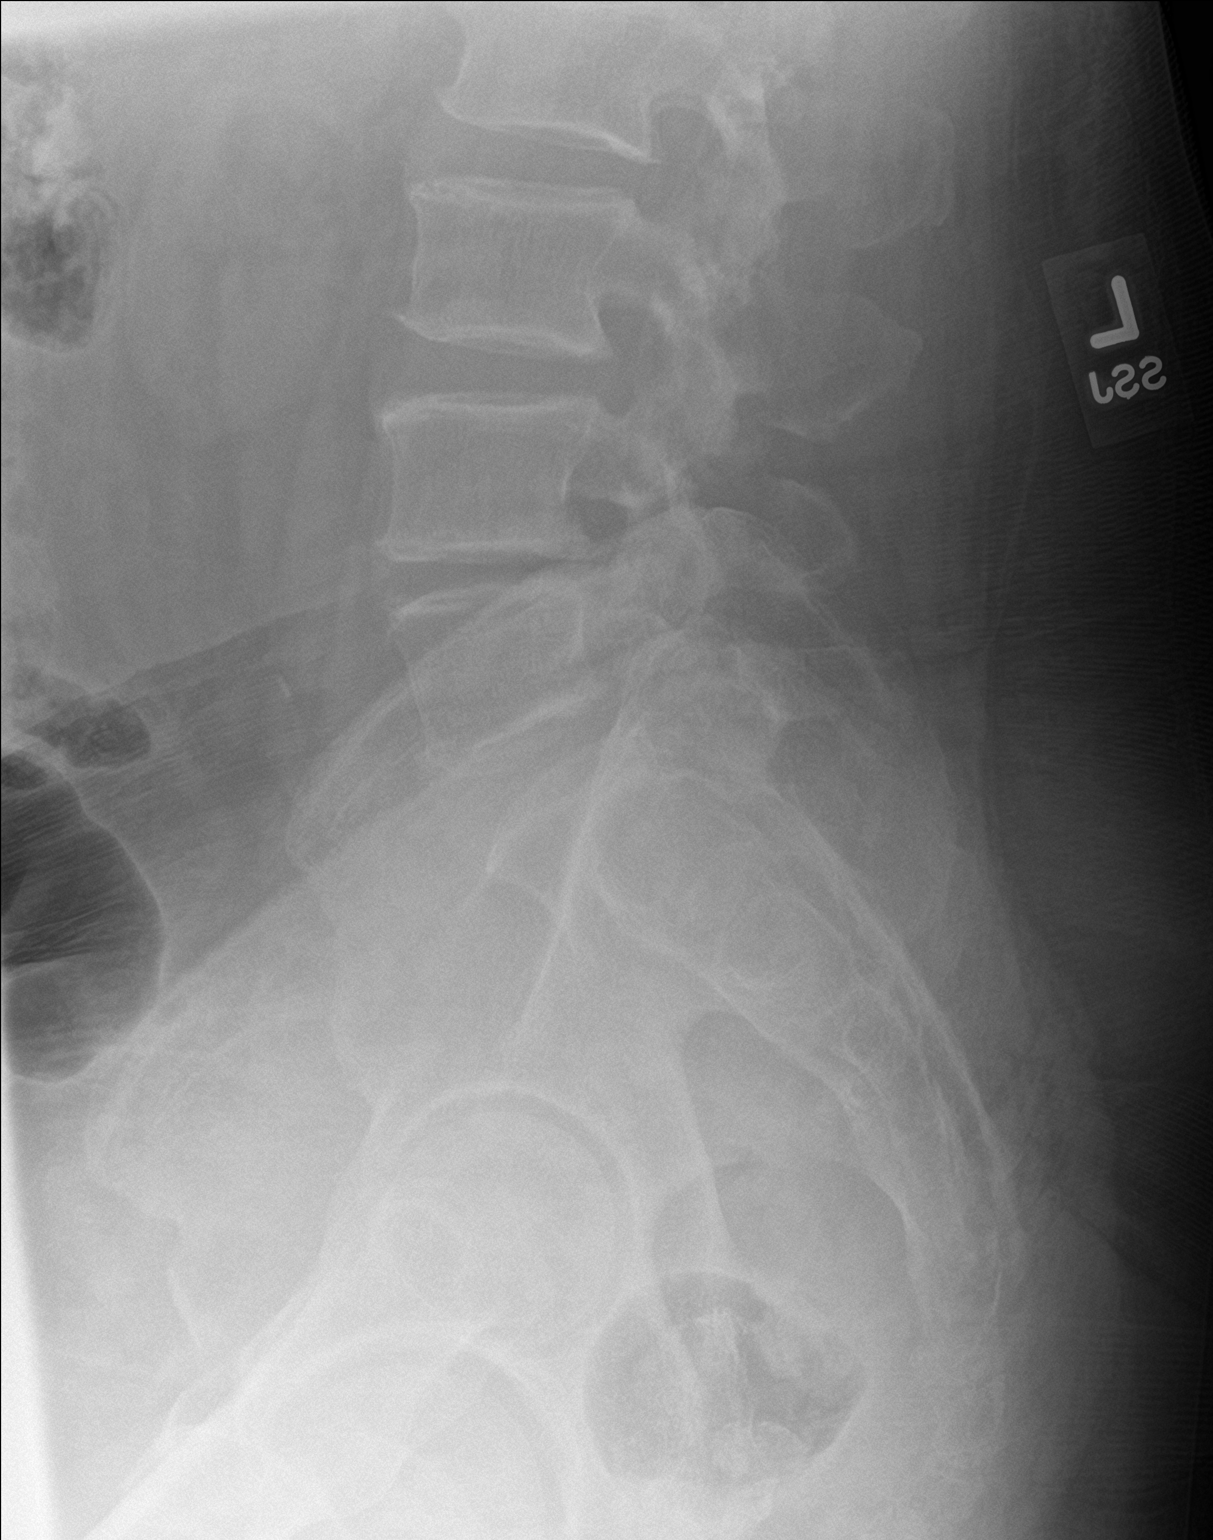

[3 of 3 positions shown; findings below may reference images not displayed]

FINDINGS: Five non-rib-bearing lumbar vertebral bodies. Multilevel
degenerative changes. There is no evidence of lumbar spine fracture.
Persistent trace grade 1 anterolisthesis with bilateral L5 pars
articularis defects. Right upper quadrant surgical clips.
IMPRESSION: No acute displaced fracture or traumatic listhesis of the lumbar
spine.

## 2023-01-06 DIAGNOSIS — F1120 Opioid dependence, uncomplicated: Secondary | ICD-10-CM | POA: Diagnosis not present

## 2023-01-26 DIAGNOSIS — F1120 Opioid dependence, uncomplicated: Secondary | ICD-10-CM | POA: Diagnosis not present

## 2023-02-09 DIAGNOSIS — F1120 Opioid dependence, uncomplicated: Secondary | ICD-10-CM | POA: Diagnosis not present

## 2023-08-02 DIAGNOSIS — Z6841 Body Mass Index (BMI) 40.0 and over, adult: Secondary | ICD-10-CM | POA: Diagnosis not present

## 2023-08-02 DIAGNOSIS — R079 Chest pain, unspecified: Secondary | ICD-10-CM | POA: Diagnosis not present

## 2023-08-02 DIAGNOSIS — I4891 Unspecified atrial fibrillation: Secondary | ICD-10-CM | POA: Diagnosis not present

## 2023-08-02 DIAGNOSIS — I10 Essential (primary) hypertension: Secondary | ICD-10-CM | POA: Diagnosis not present

## 2023-08-02 DIAGNOSIS — Z1211 Encounter for screening for malignant neoplasm of colon: Secondary | ICD-10-CM | POA: Diagnosis not present

## 2023-08-02 DIAGNOSIS — M7989 Other specified soft tissue disorders: Secondary | ICD-10-CM | POA: Diagnosis not present

## 2023-08-09 DIAGNOSIS — I2510 Atherosclerotic heart disease of native coronary artery without angina pectoris: Secondary | ICD-10-CM | POA: Diagnosis not present

## 2023-08-09 DIAGNOSIS — I10 Essential (primary) hypertension: Secondary | ICD-10-CM | POA: Diagnosis not present

## 2023-08-09 DIAGNOSIS — I4891 Unspecified atrial fibrillation: Secondary | ICD-10-CM | POA: Diagnosis not present

## 2023-08-09 DIAGNOSIS — R079 Chest pain, unspecified: Secondary | ICD-10-CM | POA: Diagnosis not present

## 2023-08-14 DIAGNOSIS — M7989 Other specified soft tissue disorders: Secondary | ICD-10-CM | POA: Diagnosis not present

## 2023-08-16 DIAGNOSIS — K118 Other diseases of salivary glands: Secondary | ICD-10-CM | POA: Diagnosis not present

## 2023-08-16 DIAGNOSIS — I482 Chronic atrial fibrillation, unspecified: Secondary | ICD-10-CM | POA: Diagnosis not present

## 2023-08-16 DIAGNOSIS — Z23 Encounter for immunization: Secondary | ICD-10-CM | POA: Diagnosis not present

## 2023-08-16 DIAGNOSIS — Z6841 Body Mass Index (BMI) 40.0 and over, adult: Secondary | ICD-10-CM | POA: Diagnosis not present

## 2023-08-16 DIAGNOSIS — F17200 Nicotine dependence, unspecified, uncomplicated: Secondary | ICD-10-CM | POA: Diagnosis not present

## 2023-08-16 DIAGNOSIS — C9110 Chronic lymphocytic leukemia of B-cell type not having achieved remission: Secondary | ICD-10-CM | POA: Diagnosis not present

## 2023-08-16 DIAGNOSIS — Z7901 Long term (current) use of anticoagulants: Secondary | ICD-10-CM | POA: Diagnosis not present

## 2023-08-16 DIAGNOSIS — E669 Obesity, unspecified: Secondary | ICD-10-CM | POA: Diagnosis not present

## 2023-08-16 DIAGNOSIS — R5383 Other fatigue: Secondary | ICD-10-CM | POA: Diagnosis not present

## 2023-08-16 DIAGNOSIS — I4891 Unspecified atrial fibrillation: Secondary | ICD-10-CM | POA: Diagnosis not present

## 2023-08-16 DIAGNOSIS — E349 Endocrine disorder, unspecified: Secondary | ICD-10-CM | POA: Diagnosis not present

## 2023-08-17 ENCOUNTER — Ambulatory Visit: Payer: 59 | Admitting: Cardiovascular Disease

## 2023-08-17 ENCOUNTER — Encounter: Payer: Self-pay | Admitting: Cardiovascular Disease

## 2023-08-17 VITALS — BP 110/70 | HR 93 | Ht 73.0 in | Wt 330.8 lb

## 2023-08-17 DIAGNOSIS — I4891 Unspecified atrial fibrillation: Secondary | ICD-10-CM | POA: Diagnosis not present

## 2023-08-17 DIAGNOSIS — R0602 Shortness of breath: Secondary | ICD-10-CM | POA: Diagnosis not present

## 2023-08-17 DIAGNOSIS — I2510 Atherosclerotic heart disease of native coronary artery without angina pectoris: Secondary | ICD-10-CM

## 2023-08-17 DIAGNOSIS — R0789 Other chest pain: Secondary | ICD-10-CM | POA: Diagnosis not present

## 2023-08-17 DIAGNOSIS — E782 Mixed hyperlipidemia: Secondary | ICD-10-CM

## 2023-08-17 MED ORDER — SOTALOL HCL 80 MG PO TABS
80.0000 mg | ORAL_TABLET | Freq: Every day | ORAL | 2 refills | Status: DC
Start: 2023-08-17 — End: 2023-09-01

## 2023-08-17 NOTE — Progress Notes (Signed)
Cardiology Office Note   Date:  08/17/2023   ID:  HIEU RIEKEN, DOB 26-May-1966, MRN [id number]  PCP:  Micheal Likens, DO  Cardiologist:  Adrian Blackwater, MD      History of Present Illness: Albert Sanders is a 57 y.o. male who presents for  Chief Complaint  Patient presents with   Establish Care    Consult- 2nd opinion    My hearts is in a.fib, feels palpitation and chest pain      Past Medical History:  Diagnosis Date   Arthritis    Clavicular fracture    CLL (chronic lymphocytic leukemia) (HCC)    CLL   Complication of anesthesia    woke up during gallbladder surgery-pt states it takes alot of anesthesia to sedate him    Erectile dysfunction    GERD (gastroesophageal reflux disease)    History of kidney stones    Morbid obesity (HCC)    Renal disorder    Urinary calculus      Past Surgical History:  Procedure Laterality Date   ANTERIOR CRUCIATE LIGAMENT REPAIR     BICEPS TENDON REPAIR Right    BONE MARROW BIOPSY  08-2015   CHOLECYSTECTOMY     CYSTOSCOPY W/ RETROGRADES Bilateral 09/16/2015   Procedure: CYSTOSCOPY WITH RETROGRADE PYELOGRAM;  Surgeon: Hildred Laser, MD;  Location: [facility];  Service: Urology;  Laterality: Bilateral;   HARDWARE REMOVAL Left 01/08/2018   Procedure: HARDWARE REMOVAL- DEEP HARDWARE;  Surgeon: Lyndle Herrlich, MD;  Location: [facility];  Service: Orthopedics;  Laterality: Left;     Current Outpatient Medications  Medication Sig Dispense Refill   apixaban (ELIQUIS) 5 MG TABS tablet Take 5 mg by mouth 2 (two) times daily.     atorvastatin (LIPITOR) 40 MG tablet Take 1 tablet by mouth daily.     chlorthalidone (HYGROTON) 25 MG tablet Take 25 mg by mouth daily.     diclofenac Sodium (VOLTAREN) 1 % GEL Apply 4 g topically 4 (four) times daily.     Dulaglutide (TRULICITY) 1.5 MG/0.5ML SOPN Inject 1.5 mg into the skin daily.     Multiple Vitamin (MULTIVITAMIN) capsule Take 1 capsule by mouth daily.     omeprazole  (PRILOSEC) 10 MG capsule Take 10 mg by mouth as needed.     sotalol (BETAPACE) 80 MG tablet Take 1 tablet (80 mg total) by mouth daily. 60 tablet 2   metroNIDAZOLE (FLAGYL) 500 MG tablet Take 1 tablet (500 mg total) by mouth 3 (three) times daily. (Patient not taking: Reported on 06/21/2019) 12 tablet 0   oxymetazoline (AFRIN) 0.05 % nasal spray Place 1 spray into both nostrils daily as needed for congestion. (Patient not taking: Reported on 08/17/2023)     No current facility-administered medications for this visit.    Allergies:   Contrast media [iodinated contrast media]    Social History:   reports that he has never smoked. His smokeless tobacco use includes snuff. He reports that he does not currently use alcohol. 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   Family History:  family history includes CAD in his maternal grandmother; Cancer in his paternal grandmother; Diabetes in his paternal grandfather and paternal uncle.    ROS:     Review of Systems  Constitutional: Negative.   HENT: Negative.    Eyes: Negative.   Respiratory: Negative.    Gastrointestinal: Negative.   Genitourinary: Negative.  Musculoskeletal: Negative.   Skin: Negative.   Neurological: Negative.   Endo/Heme/Allergies: Negative.   Psychiatric/Behavioral: Negative.    All other systems reviewed and are negative.     All other systems are reviewed and negative.    PHYSICAL EXAM: VS:  BP 110/70   Pulse 93   Ht 6\' 1"  (1.854 m)   Wt (!) 330 lb 12.8 oz (150 kg)   SpO2 97%   BMI 43.64 kg/m  , BMI Body mass index is 43.64 kg/m. Last weight:  Wt Readings from Last 3 Encounters:  08/17/23 (!) 330 lb 12.8 oz (150 kg)  08/17/21 286 lb 9.6 oz (130 kg)  06/21/19 286 lb 9.6 oz (130 kg)     Physical Exam Vitals reviewed.  Constitutional:      Appearance: Normal appearance. He is normal weight.  HENT:     Head: Normocephalic.     Nose: Nose  normal.     Mouth/Throat:     Mouth: Mucous membranes are moist.  Eyes:     Pupils: Pupils are equal, round, and reactive to light.  Cardiovascular:     Rate and Rhythm: Normal rate and regular rhythm.     Pulses: Normal pulses.     Heart sounds: Normal heart sounds.  Pulmonary:     Effort: Pulmonary effort is normal.  Abdominal:     General: Abdomen is flat. Bowel sounds are normal.  Musculoskeletal:        General: Normal range of motion.     Cervical back: Normal range of motion.  Skin:    General: Skin is warm.  Neurological:     General: No focal deficit present.     Mental Status: He is alert.  Psychiatric:        Mood and Affect: Mood normal.       EKG: afib 81/min non specific st changes  Recent Labs: No results found for requested labs within last 365 days.    Lipid Panel    Component Value Date/Time   CHOL 116 10/07/2016 0956   TRIG 94 10/07/2016 0956   HDL 36 (L) 10/07/2016 0956   CHOLHDL 3.2 10/07/2016 0956   VLDL 19 10/07/2016 0956   LDLCALC 61 10/07/2016 0956      Other studies Reviewed: Additional studies/ records that were reviewed today include:  Review of the above records demonstrates:      11/25/2016    3:00 PM  PAD Screen  Previous PAD dx? No  Previous surgical procedure? No  Pain with walking? Yes  Subsides with rest? Yes  Feet/toe relief with dangling? No  Painful, non-healing ulcers? No  Extremities discolored? No      ASSESSMENT AND PLAN:    ICD-10-CM   1. Other chest pain  R07.89 sotalol (BETAPACE) 80 MG tablet    MYOCARDIAL PERFUSION IMAGING    PCV ECHOCARDIOGRAM COMPLETE    2. SOB (shortness of breath)  R06.02 sotalol (BETAPACE) 80 MG tablet    MYOCARDIAL PERFUSION IMAGING    PCV ECHOCARDIOGRAM COMPLETE    3. Coronary artery disease involving native coronary artery of native heart without angina pectoris  I25.10 sotalol (BETAPACE) 80 MG tablet    MYOCARDIAL PERFUSION IMAGING    PCV ECHOCARDIOGRAM COMPLETE    4.  Mixed hyperlipidemia  E78.2 sotalol (BETAPACE) 80 MG tablet    MYOCARDIAL PERFUSION IMAGING    PCV ECHOCARDIOGRAM COMPLETE    5. Atrial fibrillation, controlled (HCC)  I48.91 sotalol (BETAPACE) 80 MG tablet  MYOCARDIAL PERFUSION IMAGING    PCV ECHOCARDIOGRAM COMPLETE   start sotolol, stop metoprolol, sleep study, echo, stress test       Problem List Items Addressed This Visit   None Visit Diagnoses     Other chest pain    -  Primary   Relevant Medications   sotalol (BETAPACE) 80 MG tablet   Other Relevant Orders   MYOCARDIAL PERFUSION IMAGING   PCV ECHOCARDIOGRAM COMPLETE   SOB (shortness of breath)       Relevant Medications   sotalol (BETAPACE) 80 MG tablet   Other Relevant Orders   MYOCARDIAL PERFUSION IMAGING   PCV ECHOCARDIOGRAM COMPLETE   Coronary artery disease involving native coronary artery of native heart without angina pectoris       Relevant Medications   apixaban (ELIQUIS) 5 MG TABS tablet   atorvastatin (LIPITOR) 40 MG tablet   chlorthalidone (HYGROTON) 25 MG tablet   sotalol (BETAPACE) 80 MG tablet   Other Relevant Orders   MYOCARDIAL PERFUSION IMAGING   PCV ECHOCARDIOGRAM COMPLETE   Mixed hyperlipidemia       Relevant Medications   apixaban (ELIQUIS) 5 MG TABS tablet   atorvastatin (LIPITOR) 40 MG tablet   chlorthalidone (HYGROTON) 25 MG tablet   sotalol (BETAPACE) 80 MG tablet   Other Relevant Orders   MYOCARDIAL PERFUSION IMAGING   PCV ECHOCARDIOGRAM COMPLETE   Atrial fibrillation, controlled (HCC)       start sotolol, stop metoprolol, sleep study, echo, stress test   Relevant Medications   apixaban (ELIQUIS) 5 MG TABS tablet   atorvastatin (LIPITOR) 40 MG tablet   chlorthalidone (HYGROTON) 25 MG tablet   sotalol (BETAPACE) 80 MG tablet   Other Relevant Orders   MYOCARDIAL PERFUSION IMAGING   PCV ECHOCARDIOGRAM COMPLETE          Disposition:   Return in about 2 weeks (around 08/31/2023) for echo, stress test and sleep study.    Total  time spent: 45 minutes  Signed,  Adrian Blackwater, MD  08/17/2023 11:47 AM    [facility]

## 2023-08-23 ENCOUNTER — Ambulatory Visit: Payer: 59

## 2023-08-24 ENCOUNTER — Ambulatory Visit (INDEPENDENT_AMBULATORY_CARE_PROVIDER_SITE_OTHER): Payer: 59

## 2023-08-24 DIAGNOSIS — I2510 Atherosclerotic heart disease of native coronary artery without angina pectoris: Secondary | ICD-10-CM

## 2023-08-24 DIAGNOSIS — I4891 Unspecified atrial fibrillation: Secondary | ICD-10-CM | POA: Diagnosis not present

## 2023-08-24 DIAGNOSIS — E782 Mixed hyperlipidemia: Secondary | ICD-10-CM

## 2023-08-24 DIAGNOSIS — I351 Nonrheumatic aortic (valve) insufficiency: Secondary | ICD-10-CM

## 2023-08-24 DIAGNOSIS — R0602 Shortness of breath: Secondary | ICD-10-CM

## 2023-08-24 DIAGNOSIS — I361 Nonrheumatic tricuspid (valve) insufficiency: Secondary | ICD-10-CM

## 2023-08-24 DIAGNOSIS — R0789 Other chest pain: Secondary | ICD-10-CM

## 2023-08-28 ENCOUNTER — Ambulatory Visit (INDEPENDENT_AMBULATORY_CARE_PROVIDER_SITE_OTHER): Payer: 59

## 2023-08-28 DIAGNOSIS — I4891 Unspecified atrial fibrillation: Secondary | ICD-10-CM | POA: Diagnosis not present

## 2023-08-28 DIAGNOSIS — R0789 Other chest pain: Secondary | ICD-10-CM

## 2023-08-28 DIAGNOSIS — I2510 Atherosclerotic heart disease of native coronary artery without angina pectoris: Secondary | ICD-10-CM | POA: Diagnosis not present

## 2023-08-28 DIAGNOSIS — R0602 Shortness of breath: Secondary | ICD-10-CM | POA: Diagnosis not present

## 2023-08-28 MED ORDER — TECHNETIUM TC 99M SESTAMIBI GENERIC - CARDIOLITE
32.1000 | Freq: Once | INTRAVENOUS | Status: AC | PRN
  Administered 2023-08-28: 32.1 via INTRAVENOUS

## 2023-08-28 MED ORDER — TECHNETIUM TC 99M SESTAMIBI GENERIC - CARDIOLITE
10.2000 | Freq: Once | INTRAVENOUS | Status: AC | PRN
  Administered 2023-08-28: 10.2 via INTRAVENOUS

## 2023-08-29 DIAGNOSIS — M1990 Unspecified osteoarthritis, unspecified site: Secondary | ICD-10-CM | POA: Diagnosis not present

## 2023-08-29 DIAGNOSIS — Z809 Family history of malignant neoplasm, unspecified: Secondary | ICD-10-CM | POA: Diagnosis not present

## 2023-08-29 DIAGNOSIS — I4891 Unspecified atrial fibrillation: Secondary | ICD-10-CM | POA: Diagnosis not present

## 2023-08-29 DIAGNOSIS — F1120 Opioid dependence, uncomplicated: Secondary | ICD-10-CM | POA: Diagnosis not present

## 2023-08-29 DIAGNOSIS — K5900 Constipation, unspecified: Secondary | ICD-10-CM | POA: Diagnosis not present

## 2023-08-29 DIAGNOSIS — Z91041 Radiographic dye allergy status: Secondary | ICD-10-CM | POA: Diagnosis not present

## 2023-08-29 DIAGNOSIS — K219 Gastro-esophageal reflux disease without esophagitis: Secondary | ICD-10-CM | POA: Diagnosis not present

## 2023-08-29 DIAGNOSIS — E785 Hyperlipidemia, unspecified: Secondary | ICD-10-CM | POA: Diagnosis not present

## 2023-08-29 DIAGNOSIS — G4730 Sleep apnea, unspecified: Secondary | ICD-10-CM | POA: Diagnosis not present

## 2023-08-29 DIAGNOSIS — Z7901 Long term (current) use of anticoagulants: Secondary | ICD-10-CM | POA: Diagnosis not present

## 2023-08-29 DIAGNOSIS — I10 Essential (primary) hypertension: Secondary | ICD-10-CM | POA: Diagnosis not present

## 2023-09-01 ENCOUNTER — Ambulatory Visit: Payer: 59 | Admitting: Cardiovascular Disease

## 2023-09-01 ENCOUNTER — Encounter: Payer: Self-pay | Admitting: Cardiovascular Disease

## 2023-09-01 DIAGNOSIS — I4891 Unspecified atrial fibrillation: Secondary | ICD-10-CM | POA: Diagnosis not present

## 2023-09-01 DIAGNOSIS — I2510 Atherosclerotic heart disease of native coronary artery without angina pectoris: Secondary | ICD-10-CM | POA: Diagnosis not present

## 2023-09-01 DIAGNOSIS — R0602 Shortness of breath: Secondary | ICD-10-CM

## 2023-09-01 DIAGNOSIS — R0789 Other chest pain: Secondary | ICD-10-CM | POA: Diagnosis not present

## 2023-09-01 DIAGNOSIS — E782 Mixed hyperlipidemia: Secondary | ICD-10-CM

## 2023-09-01 MED ORDER — SOTALOL HCL 80 MG PO TABS
80.0000 mg | ORAL_TABLET | Freq: Two times a day (BID) | ORAL | 2 refills | Status: DC
Start: 2023-09-01 — End: 2023-12-21

## 2023-09-01 NOTE — Progress Notes (Signed)
Cardiology Office Note   Date:  09/01/2023   ID:  GREGG MCCATHERN, DOB [birth year], MRN [id number]  PCP:  Micheal Likens, DO  Cardiologist:  Adrian Blackwater, MD      History of Present Illness: Albert Sanders is a 57 y.o. male who presents for  Chief Complaint  Patient presents with   Follow-up    Test results    Patient states that he has been wearing CPAP and went back to sinus rhythm on sotalol 80 mg during stress test.  Currently back in A-fib and is on 80 mg of sotalol.  Stress test was equivocal and echocardiogram showed normal ejection fraction with mild MR.      Past Medical History:  Diagnosis Date   Arthritis    Clavicular fracture    CLL (chronic lymphocytic leukemia) (HCC)    CLL   Complication of anesthesia    woke up during gallbladder surgery-pt states it takes alot of anesthesia to sedate him    Erectile dysfunction    GERD (gastroesophageal reflux disease)    History of kidney stones    Morbid obesity (HCC)    Renal disorder    Urinary calculus      Past Surgical History:  Procedure Laterality Date   ANTERIOR CRUCIATE LIGAMENT REPAIR     BICEPS TENDON REPAIR Right    BONE MARROW BIOPSY  08-2015   CHOLECYSTECTOMY     CYSTOSCOPY W/ RETROGRADES Bilateral 09/16/2015   Procedure: CYSTOSCOPY WITH RETROGRADE PYELOGRAM;  Surgeon: Hildred Laser, MD;  Location: [facility];  Service: Urology;  Laterality: Bilateral;   HARDWARE REMOVAL Left 01/08/2018   Procedure: HARDWARE REMOVAL- DEEP HARDWARE;  Surgeon: Lyndle Herrlich, MD;  Location: [facility];  Service: Orthopedics;  Laterality: Left;     Current Outpatient Medications  Medication Sig Dispense Refill   apixaban (ELIQUIS) 5 MG TABS tablet Take 5 mg by mouth 2 (two) times daily.     atorvastatin (LIPITOR) 40 MG tablet Take 1 tablet by mouth daily.     chlorthalidone (HYGROTON) 25 MG tablet Take 25 mg by mouth daily.     diclofenac Sodium (VOLTAREN) 1 % GEL Apply 4 g topically 4 (four) times  daily.     Dulaglutide (TRULICITY) 1.5 MG/0.5ML SOPN Inject 3 mg into the skin daily.     Multiple Vitamin (MULTIVITAMIN) capsule Take 1 capsule by mouth daily.     omeprazole (PRILOSEC) 10 MG capsule Take 10 mg by mouth as needed.     sotalol (BETAPACE) 80 MG tablet Take 1 tablet (80 mg total) by mouth 2 (two) times daily. 60 tablet 2   No current facility-administered medications for this visit.    Allergies:   Contrast media [iodinated contrast media]    Social History:   reports that he has never smoked. His smokeless tobacco use includes snuff. He reports that he does not currently use alcohol. 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   Family History:  family history includes CAD in his maternal grandmother; Cancer in his paternal grandmother; Diabetes in his paternal grandfather and paternal uncle.    ROS:     Review of Systems  Constitutional: Negative.   HENT: Negative.    Eyes: Negative.   Respiratory: Negative.    Gastrointestinal: Negative.   Genitourinary: Negative.   Musculoskeletal: Negative.   Skin: Negative.   Neurological: Negative.   Endo/Heme/Allergies: Negative.   Psychiatric/Behavioral:  Negative.    All other systems reviewed and are negative.     All other systems are reviewed and negative.    PHYSICAL EXAM: VS:  BP 110/70   Pulse 94   Ht 6\' 1"  (1.854 m)   Wt (!) 333 lb 6.4 oz (151.2 kg)   SpO2 94%   BMI 43.99 kg/m  , BMI Body mass index is 43.99 kg/m. Last weight:  Wt Readings from Last 3 Encounters:  09/01/23 (!) 333 lb 6.4 oz (151.2 kg)  08/17/23 (!) 330 lb 12.8 oz (150 kg)  08/17/21 286 lb 9.6 oz (130 kg)     Physical Exam Vitals reviewed.  Constitutional:      Appearance: Normal appearance. He is normal weight.  HENT:     Head: Normocephalic.     Nose: Nose normal.     Mouth/Throat:     Mouth: Mucous membranes are moist.  Eyes:     Pupils: Pupils are equal, round,  and reactive to light.  Cardiovascular:     Rate and Rhythm: Normal rate and regular rhythm.     Pulses: Normal pulses.     Heart sounds: Normal heart sounds.  Pulmonary:     Effort: Pulmonary effort is normal.  Abdominal:     General: Abdomen is flat. Bowel sounds are normal.  Musculoskeletal:        General: Normal range of motion.     Cervical back: Normal range of motion.  Skin:    General: Skin is warm.  Neurological:     General: No focal deficit present.     Mental Status: He is alert.  Psychiatric:        Mood and Affect: Mood normal.       EKG:   Recent Labs: No results found for requested labs within last 365 days.    Lipid Panel    Component Value Date/Time   CHOL 116 10/07/2016 0956   TRIG 94 10/07/2016 0956   HDL 36 (L) 10/07/2016 0956   CHOLHDL 3.2 10/07/2016 0956   VLDL 19 10/07/2016 0956   LDLCALC 61 10/07/2016 0956      Other studies Reviewed: Additional studies/ records that were reviewed today include:  Review of the above records demonstrates:      11/25/2016    3:00 PM  PAD Screen  Previous PAD dx? No  Previous surgical procedure? No  Pain with walking? Yes  Subsides with rest? Yes  Feet/toe relief with dangling? No  Painful, non-healing ulcers? No  Extremities discolored? No      ASSESSMENT AND PLAN:    ICD-10-CM   1. Other chest pain  R07.89 sotalol (BETAPACE) 80 MG tablet   Stress test was equivocal with mild basal inferior wall defect with normal ejection fraction.  Patient denies any chest pain at this time.    2. SOB (shortness of breath)  R06.02 sotalol (BETAPACE) 80 MG tablet    3. Coronary artery disease involving native coronary artery of native heart without angina pectoris  I25.10 sotalol (BETAPACE) 80 MG tablet    4. Mixed hyperlipidemia  E78.2 sotalol (BETAPACE) 80 MG tablet    5. Atrial fibrillation, controlled (HCC)  I48.91 sotalol (BETAPACE) 80 MG tablet   Change sotalol to 80 twice daily as during stress  test patient was in sinus rhythm and is back in A-fib.  Advise compliance with CPAP       Problem List Items Addressed This Visit   None Visit Diagnoses  Other chest pain       Stress test was equivocal with mild basal inferior wall defect with normal ejection fraction.  Patient denies any chest pain at this time.   Relevant Medications   sotalol (BETAPACE) 80 MG tablet   SOB (shortness of breath)       Relevant Medications   sotalol (BETAPACE) 80 MG tablet   Coronary artery disease involving native coronary artery of native heart without angina pectoris       Relevant Medications   sotalol (BETAPACE) 80 MG tablet   Mixed hyperlipidemia       Relevant Medications   sotalol (BETAPACE) 80 MG tablet   Atrial fibrillation, controlled (HCC)       Change sotalol to 80 twice daily as during stress test patient was in sinus rhythm and is back in A-fib.  Advise compliance with CPAP   Relevant Medications   sotalol (BETAPACE) 80 MG tablet          Disposition:   Return in about 4 weeks (around 09/29/2023).    Total time spent: 35 minutes  Signed,  Adrian Blackwater, MD  09/01/2023 12:43 PM    [facility]

## 2023-10-06 ENCOUNTER — Ambulatory Visit: Payer: 59 | Admitting: Cardiovascular Disease

## 2023-10-12 ENCOUNTER — Ambulatory Visit: Payer: 59 | Admitting: Cardiovascular Disease

## 2023-12-20 ENCOUNTER — Other Ambulatory Visit: Payer: Self-pay | Admitting: Cardiovascular Disease

## 2023-12-20 DIAGNOSIS — I2510 Atherosclerotic heart disease of native coronary artery without angina pectoris: Secondary | ICD-10-CM

## 2023-12-20 DIAGNOSIS — R0602 Shortness of breath: Secondary | ICD-10-CM

## 2023-12-20 DIAGNOSIS — E782 Mixed hyperlipidemia: Secondary | ICD-10-CM

## 2023-12-20 DIAGNOSIS — I4891 Unspecified atrial fibrillation: Secondary | ICD-10-CM

## 2023-12-20 DIAGNOSIS — R0789 Other chest pain: Secondary | ICD-10-CM

## 2024-07-02 DIAGNOSIS — K219 Gastro-esophageal reflux disease without esophagitis: Secondary | ICD-10-CM | POA: Diagnosis not present

## 2024-07-02 DIAGNOSIS — I4891 Unspecified atrial fibrillation: Secondary | ICD-10-CM | POA: Diagnosis not present

## 2024-07-02 DIAGNOSIS — N182 Chronic kidney disease, stage 2 (mild): Secondary | ICD-10-CM | POA: Diagnosis not present

## 2024-07-02 DIAGNOSIS — I10 Essential (primary) hypertension: Secondary | ICD-10-CM | POA: Diagnosis not present

## 2024-07-02 DIAGNOSIS — I2510 Atherosclerotic heart disease of native coronary artery without angina pectoris: Secondary | ICD-10-CM | POA: Diagnosis not present

## 2024-07-02 DIAGNOSIS — F1121 Opioid dependence, in remission: Secondary | ICD-10-CM | POA: Diagnosis not present

## 2024-07-08 DIAGNOSIS — G4733 Obstructive sleep apnea (adult) (pediatric): Secondary | ICD-10-CM | POA: Diagnosis not present

## 2024-07-08 DIAGNOSIS — I4891 Unspecified atrial fibrillation: Secondary | ICD-10-CM | POA: Diagnosis not present

## 2024-07-08 DIAGNOSIS — Z8679 Personal history of other diseases of the circulatory system: Secondary | ICD-10-CM | POA: Diagnosis not present

## 2024-07-08 DIAGNOSIS — Z6841 Body Mass Index (BMI) 40.0 and over, adult: Secondary | ICD-10-CM | POA: Diagnosis not present

## 2024-07-08 DIAGNOSIS — E66813 Obesity, class 3: Secondary | ICD-10-CM | POA: Diagnosis not present

## 2024-07-09 DIAGNOSIS — I4891 Unspecified atrial fibrillation: Secondary | ICD-10-CM | POA: Diagnosis not present

## 2024-07-09 DIAGNOSIS — F1111 Opioid abuse, in remission: Secondary | ICD-10-CM | POA: Diagnosis not present

## 2024-07-09 DIAGNOSIS — N182 Chronic kidney disease, stage 2 (mild): Secondary | ICD-10-CM | POA: Diagnosis not present

## 2024-07-09 DIAGNOSIS — I4819 Other persistent atrial fibrillation: Secondary | ICD-10-CM | POA: Diagnosis not present

## 2024-07-09 DIAGNOSIS — Z720 Tobacco use: Secondary | ICD-10-CM | POA: Diagnosis not present

## 2024-07-09 DIAGNOSIS — K219 Gastro-esophageal reflux disease without esophagitis: Secondary | ICD-10-CM | POA: Diagnosis not present

## 2024-07-09 DIAGNOSIS — I129 Hypertensive chronic kidney disease with stage 1 through stage 4 chronic kidney disease, or unspecified chronic kidney disease: Secondary | ICD-10-CM | POA: Diagnosis not present

## 2024-07-10 DIAGNOSIS — I4891 Unspecified atrial fibrillation: Secondary | ICD-10-CM | POA: Diagnosis not present

## 2024-07-30 DIAGNOSIS — I4891 Unspecified atrial fibrillation: Secondary | ICD-10-CM | POA: Diagnosis not present

## 2024-07-30 DIAGNOSIS — I2510 Atherosclerotic heart disease of native coronary artery without angina pectoris: Secondary | ICD-10-CM | POA: Diagnosis not present

## 2024-08-07 DIAGNOSIS — I4891 Unspecified atrial fibrillation: Secondary | ICD-10-CM | POA: Diagnosis not present

## 2024-08-07 DIAGNOSIS — Z79899 Other long term (current) drug therapy: Secondary | ICD-10-CM | POA: Diagnosis not present

## 2024-08-07 DIAGNOSIS — Z5181 Encounter for therapeutic drug level monitoring: Secondary | ICD-10-CM | POA: Diagnosis not present

## 2025-01-02 NOTE — Progress Notes (Signed)
 LETRELL ATTWOOD                                          MRN: [id number]   01/02/2025   The [facility] Quality Team Specialist reviewed this patient medical record for the purposes of chart review for care gap closure. The following were reviewed: chart review for care gap closure-controlling blood pressure.    [facility] Quality Team
